# Patient Record
Sex: Female | Born: 1960 | Race: Black or African American | Hispanic: No | Smoking: Never smoker
Health system: Southern US, Community
[De-identification: ages and names within clinical notes are randomized; demographics above are authoritative.]

## PROBLEM LIST (undated history)

## (undated) DIAGNOSIS — I10 Essential (primary) hypertension: Secondary | ICD-10-CM

## (undated) DIAGNOSIS — R739 Hyperglycemia, unspecified: Secondary | ICD-10-CM

## (undated) DIAGNOSIS — F329 Major depressive disorder, single episode, unspecified: Secondary | ICD-10-CM

## (undated) DIAGNOSIS — C8190 Hodgkin lymphoma, unspecified, unspecified site: Secondary | ICD-10-CM

## (undated) DIAGNOSIS — R590 Localized enlarged lymph nodes: Secondary | ICD-10-CM

## (undated) DIAGNOSIS — T7840XA Allergy, unspecified, initial encounter: Secondary | ICD-10-CM

## (undated) DIAGNOSIS — Z862 Personal history of diseases of the blood and blood-forming organs and certain disorders involving the immune mechanism: Secondary | ICD-10-CM

## (undated) DIAGNOSIS — F419 Anxiety disorder, unspecified: Secondary | ICD-10-CM

## (undated) DIAGNOSIS — R7303 Prediabetes: Secondary | ICD-10-CM

## (undated) DIAGNOSIS — Z8669 Personal history of other diseases of the nervous system and sense organs: Secondary | ICD-10-CM

## (undated) DIAGNOSIS — E785 Hyperlipidemia, unspecified: Secondary | ICD-10-CM

## (undated) DIAGNOSIS — A64 Unspecified sexually transmitted disease: Secondary | ICD-10-CM

## (undated) DIAGNOSIS — D219 Benign neoplasm of connective and other soft tissue, unspecified: Secondary | ICD-10-CM

## (undated) DIAGNOSIS — F32A Depression, unspecified: Secondary | ICD-10-CM

## (undated) DIAGNOSIS — E669 Obesity, unspecified: Secondary | ICD-10-CM

## (undated) DIAGNOSIS — M1990 Unspecified osteoarthritis, unspecified site: Secondary | ICD-10-CM

## (undated) HISTORY — DX: Personal history of diseases of the blood and blood-forming organs and certain disorders involving the immune mechanism: Z86.2

## (undated) HISTORY — DX: Allergy, unspecified, initial encounter: T78.40XA

## (undated) HISTORY — DX: Essential (primary) hypertension: I10

## (undated) HISTORY — DX: Major depressive disorder, single episode, unspecified: F32.9

## (undated) HISTORY — DX: Personal history of other diseases of the nervous system and sense organs: Z86.69

## (undated) HISTORY — DX: Hyperglycemia, unspecified: R73.9

## (undated) HISTORY — DX: Localized enlarged lymph nodes: R59.0

## (undated) HISTORY — DX: Hodgkin lymphoma, unspecified, unspecified site: C81.90

## (undated) HISTORY — DX: Unspecified sexually transmitted disease: A64

## (undated) HISTORY — DX: Anxiety disorder, unspecified: F41.9

## (undated) HISTORY — DX: Hyperlipidemia, unspecified: E78.5

## (undated) HISTORY — PX: OTHER SURGICAL HISTORY: SHX169

## (undated) HISTORY — DX: Depression, unspecified: F32.A

## (undated) HISTORY — DX: Obesity, unspecified: E66.9

## (undated) HISTORY — DX: Benign neoplasm of connective and other soft tissue, unspecified: D21.9

## (undated) HISTORY — PX: COLONOSCOPY: SHX174

## (undated) SURGERY — Surgical Case
Anesthesia: *Unknown

---

## 1997-09-19 DIAGNOSIS — C8190 Hodgkin lymphoma, unspecified, unspecified site: Secondary | ICD-10-CM

## 1997-09-19 HISTORY — DX: Hodgkin lymphoma, unspecified, unspecified site: C81.90

## 1998-02-19 ENCOUNTER — Other Ambulatory Visit: Admission: RE | Admit: 1998-02-19 | Discharge: 1998-02-19 | Payer: Self-pay | Admitting: Urology

## 1998-09-19 HISTORY — PX: LAPAROSCOPIC REMOVAL ABDOMINAL MASS: SHX6360

## 1998-09-28 ENCOUNTER — Other Ambulatory Visit: Admission: RE | Admit: 1998-09-28 | Discharge: 1998-09-28 | Payer: Self-pay | Admitting: Obstetrics & Gynecology

## 1999-03-29 ENCOUNTER — Other Ambulatory Visit: Admission: RE | Admit: 1999-03-29 | Discharge: 1999-03-29 | Payer: Self-pay | Admitting: *Deleted

## 1999-05-23 ENCOUNTER — Emergency Department (HOSPITAL_COMMUNITY): Admission: EM | Admit: 1999-05-23 | Discharge: 1999-05-23 | Payer: Self-pay | Admitting: *Deleted

## 1999-05-23 ENCOUNTER — Encounter: Payer: Self-pay | Admitting: Emergency Medicine

## 1999-06-02 ENCOUNTER — Ambulatory Visit (HOSPITAL_COMMUNITY): Admission: RE | Admit: 1999-06-02 | Discharge: 1999-06-02 | Payer: Self-pay | Admitting: Hematology and Oncology

## 1999-06-02 ENCOUNTER — Encounter: Payer: Self-pay | Admitting: Hematology and Oncology

## 1999-06-14 ENCOUNTER — Ambulatory Visit (HOSPITAL_COMMUNITY): Admission: RE | Admit: 1999-06-14 | Discharge: 1999-06-14 | Payer: Self-pay | Admitting: Gastroenterology

## 1999-07-01 ENCOUNTER — Observation Stay (HOSPITAL_COMMUNITY): Admission: RE | Admit: 1999-07-01 | Discharge: 1999-07-02 | Payer: Self-pay | Admitting: General Surgery

## 1999-07-01 ENCOUNTER — Encounter (INDEPENDENT_AMBULATORY_CARE_PROVIDER_SITE_OTHER): Payer: Self-pay | Admitting: Specialist

## 1999-07-01 ENCOUNTER — Encounter: Payer: Self-pay | Admitting: General Surgery

## 1999-07-29 ENCOUNTER — Ambulatory Visit (HOSPITAL_COMMUNITY): Admission: RE | Admit: 1999-07-29 | Discharge: 1999-07-29 | Payer: Self-pay | Admitting: Hematology and Oncology

## 1999-07-29 ENCOUNTER — Encounter: Payer: Self-pay | Admitting: Hematology and Oncology

## 1999-08-02 ENCOUNTER — Ambulatory Visit (HOSPITAL_COMMUNITY): Admission: RE | Admit: 1999-08-02 | Discharge: 1999-08-02 | Payer: Self-pay | Admitting: Hematology and Oncology

## 1999-08-02 ENCOUNTER — Encounter (INDEPENDENT_AMBULATORY_CARE_PROVIDER_SITE_OTHER): Payer: Self-pay

## 1999-08-02 ENCOUNTER — Encounter: Payer: Self-pay | Admitting: Hematology and Oncology

## 1999-08-16 ENCOUNTER — Encounter: Admission: RE | Admit: 1999-08-16 | Discharge: 1999-11-14 | Payer: Self-pay | Admitting: Radiation Oncology

## 2000-12-28 ENCOUNTER — Other Ambulatory Visit: Admission: RE | Admit: 2000-12-28 | Discharge: 2000-12-28 | Payer: Self-pay | Admitting: Obstetrics and Gynecology

## 2001-01-04 ENCOUNTER — Encounter: Payer: Self-pay | Admitting: Obstetrics and Gynecology

## 2001-01-04 ENCOUNTER — Ambulatory Visit (HOSPITAL_COMMUNITY): Admission: RE | Admit: 2001-01-04 | Discharge: 2001-01-04 | Payer: Self-pay | Admitting: Obstetrics and Gynecology

## 2001-01-08 ENCOUNTER — Ambulatory Visit: Admission: RE | Admit: 2001-01-08 | Discharge: 2001-01-08 | Payer: Self-pay | Admitting: *Deleted

## 2001-01-08 ENCOUNTER — Encounter: Payer: Self-pay | Admitting: *Deleted

## 2001-02-13 ENCOUNTER — Other Ambulatory Visit: Admission: RE | Admit: 2001-02-13 | Discharge: 2001-02-13 | Payer: Self-pay | Admitting: Obstetrics and Gynecology

## 2001-03-19 ENCOUNTER — Emergency Department (HOSPITAL_COMMUNITY): Admission: EM | Admit: 2001-03-19 | Discharge: 2001-03-19 | Payer: Self-pay | Admitting: Emergency Medicine

## 2001-03-19 ENCOUNTER — Encounter: Payer: Self-pay | Admitting: Emergency Medicine

## 2001-03-23 ENCOUNTER — Other Ambulatory Visit: Admission: RE | Admit: 2001-03-23 | Discharge: 2001-03-23 | Payer: Self-pay | Admitting: Urology

## 2002-08-08 ENCOUNTER — Inpatient Hospital Stay (HOSPITAL_COMMUNITY): Admission: AD | Admit: 2002-08-08 | Discharge: 2002-08-08 | Payer: Self-pay | Admitting: *Deleted

## 2002-10-05 ENCOUNTER — Emergency Department (HOSPITAL_COMMUNITY): Admission: EM | Admit: 2002-10-05 | Discharge: 2002-10-05 | Payer: Self-pay | Admitting: Emergency Medicine

## 2002-10-05 ENCOUNTER — Encounter: Payer: Self-pay | Admitting: Emergency Medicine

## 2002-10-31 ENCOUNTER — Inpatient Hospital Stay (HOSPITAL_COMMUNITY): Admission: AD | Admit: 2002-10-31 | Discharge: 2002-10-31 | Payer: Self-pay | Admitting: *Deleted

## 2003-03-11 ENCOUNTER — Inpatient Hospital Stay (HOSPITAL_COMMUNITY): Admission: AD | Admit: 2003-03-11 | Discharge: 2003-03-11 | Payer: Self-pay | Admitting: *Deleted

## 2003-10-27 ENCOUNTER — Emergency Department (HOSPITAL_COMMUNITY): Admission: EM | Admit: 2003-10-27 | Discharge: 2003-10-27 | Payer: Self-pay | Admitting: Emergency Medicine

## 2004-08-11 ENCOUNTER — Emergency Department (HOSPITAL_COMMUNITY): Admission: EM | Admit: 2004-08-11 | Discharge: 2004-08-11 | Payer: Self-pay | Admitting: Emergency Medicine

## 2004-09-19 HISTORY — PX: BREAST EXCISIONAL BIOPSY: SUR124

## 2005-01-06 ENCOUNTER — Other Ambulatory Visit: Admission: RE | Admit: 2005-01-06 | Discharge: 2005-01-06 | Payer: Self-pay | Admitting: Obstetrics and Gynecology

## 2005-01-10 ENCOUNTER — Encounter: Admission: RE | Admit: 2005-01-10 | Discharge: 2005-01-10 | Payer: Self-pay | Admitting: Obstetrics and Gynecology

## 2005-09-19 HISTORY — PX: BREAST SURGERY: SHX581

## 2006-05-23 ENCOUNTER — Emergency Department (HOSPITAL_COMMUNITY): Admission: EM | Admit: 2006-05-23 | Discharge: 2006-05-23 | Payer: Self-pay | Admitting: Emergency Medicine

## 2007-01-25 ENCOUNTER — Emergency Department (HOSPITAL_COMMUNITY): Admission: EM | Admit: 2007-01-25 | Discharge: 2007-01-25 | Payer: Self-pay | Admitting: Emergency Medicine

## 2008-03-14 ENCOUNTER — Emergency Department (HOSPITAL_COMMUNITY): Admission: EM | Admit: 2008-03-14 | Discharge: 2008-03-14 | Payer: Self-pay | Admitting: Family Medicine

## 2008-03-24 ENCOUNTER — Other Ambulatory Visit: Admission: RE | Admit: 2008-03-24 | Discharge: 2008-03-24 | Payer: Self-pay | Admitting: Obstetrics and Gynecology

## 2008-03-27 ENCOUNTER — Encounter: Admission: RE | Admit: 2008-03-27 | Discharge: 2008-03-27 | Payer: Self-pay | Admitting: Obstetrics and Gynecology

## 2009-10-24 ENCOUNTER — Emergency Department (HOSPITAL_COMMUNITY): Admission: EM | Admit: 2009-10-24 | Discharge: 2009-10-24 | Payer: Self-pay | Admitting: Emergency Medicine

## 2009-11-02 ENCOUNTER — Emergency Department (HOSPITAL_COMMUNITY): Admission: EM | Admit: 2009-11-02 | Discharge: 2009-11-02 | Payer: Self-pay | Admitting: Emergency Medicine

## 2009-11-02 ENCOUNTER — Emergency Department (HOSPITAL_COMMUNITY): Admission: EM | Admit: 2009-11-02 | Discharge: 2009-11-02 | Payer: Self-pay | Admitting: Family Medicine

## 2010-01-30 ENCOUNTER — Emergency Department (HOSPITAL_COMMUNITY): Admission: EM | Admit: 2010-01-30 | Discharge: 2010-01-30 | Payer: Self-pay | Admitting: Emergency Medicine

## 2010-02-18 ENCOUNTER — Emergency Department (HOSPITAL_COMMUNITY): Admission: EM | Admit: 2010-02-18 | Discharge: 2010-02-18 | Payer: Self-pay | Admitting: Emergency Medicine

## 2010-05-18 ENCOUNTER — Ambulatory Visit (HOSPITAL_COMMUNITY): Payer: Self-pay | Admitting: Psychiatry

## 2010-05-28 ENCOUNTER — Ambulatory Visit (HOSPITAL_COMMUNITY): Payer: Self-pay | Admitting: Psychiatry

## 2010-06-18 ENCOUNTER — Ambulatory Visit (HOSPITAL_COMMUNITY): Payer: Self-pay | Admitting: Psychiatry

## 2010-06-22 ENCOUNTER — Ambulatory Visit (HOSPITAL_COMMUNITY): Payer: Self-pay | Admitting: Psychiatry

## 2010-07-23 ENCOUNTER — Ambulatory Visit (HOSPITAL_COMMUNITY): Payer: Self-pay | Admitting: Psychiatry

## 2010-08-05 ENCOUNTER — Ambulatory Visit (HOSPITAL_COMMUNITY): Payer: Self-pay | Admitting: Psychiatry

## 2010-08-06 ENCOUNTER — Ambulatory Visit (HOSPITAL_COMMUNITY): Payer: Self-pay | Admitting: Psychiatry

## 2010-08-16 ENCOUNTER — Ambulatory Visit (HOSPITAL_COMMUNITY): Payer: Self-pay | Admitting: Psychiatry

## 2010-08-24 ENCOUNTER — Ambulatory Visit (HOSPITAL_COMMUNITY): Payer: Self-pay | Admitting: Physician Assistant

## 2010-08-31 ENCOUNTER — Ambulatory Visit (HOSPITAL_COMMUNITY): Payer: Self-pay | Admitting: Psychiatry

## 2010-10-10 ENCOUNTER — Encounter: Payer: Self-pay | Admitting: Obstetrics and Gynecology

## 2010-10-19 ENCOUNTER — Ambulatory Visit (HOSPITAL_COMMUNITY)
Admission: RE | Admit: 2010-10-19 | Discharge: 2010-10-19 | Payer: Self-pay | Source: Home / Self Care | Attending: Psychiatry | Admitting: Psychiatry

## 2010-11-02 ENCOUNTER — Encounter (HOSPITAL_COMMUNITY): Payer: BC Managed Care – PPO | Admitting: Psychiatry

## 2010-11-02 DIAGNOSIS — F410 Panic disorder [episodic paroxysmal anxiety] without agoraphobia: Secondary | ICD-10-CM

## 2010-11-21 ENCOUNTER — Ambulatory Visit (HOSPITAL_COMMUNITY)
Admission: RE | Admit: 2010-11-21 | Discharge: 2010-11-21 | Disposition: A | Discharge status: Home / Self Care | Payer: BC Managed Care – PPO | Source: Ambulatory Visit | Attending: Psychiatry | Admitting: Psychiatry

## 2010-11-21 DIAGNOSIS — F321 Major depressive disorder, single episode, moderate: Secondary | ICD-10-CM | POA: Insufficient documentation

## 2010-12-07 ENCOUNTER — Encounter (HOSPITAL_COMMUNITY): Payer: BC Managed Care – PPO | Admitting: Physician Assistant

## 2010-12-07 DIAGNOSIS — F411 Generalized anxiety disorder: Secondary | ICD-10-CM

## 2010-12-08 LAB — DIFFERENTIAL
Basophils Absolute: 0 10*3/uL (ref 0.0–0.1)
Lymphocytes Relative: 37 % (ref 12–46)
Neutro Abs: 9.8 10*3/uL — ABNORMAL HIGH (ref 1.7–7.7)
Neutrophils Relative %: 74 % (ref 43–77)
Neutrophils Relative %: 56 % (ref 43–77)

## 2010-12-08 LAB — RAPID URINE DRUG SCREEN, HOSP PERFORMED
Amphetamines: NOT DETECTED
Barbiturates: NOT DETECTED
Benzodiazepines: NOT DETECTED
Cocaine: NOT DETECTED
Opiates: NOT DETECTED
Tetrahydrocannabinol: NOT DETECTED

## 2010-12-08 LAB — BASIC METABOLIC PANEL WITH GFR
BUN: 14 mg/dL (ref 6–23)
CO2: 26 meq/L (ref 19–32)
Calcium: 9 mg/dL (ref 8.4–10.5)
Chloride: 105 meq/L (ref 96–112)
Creatinine, Ser: 0.87 mg/dL (ref 0.4–1.2)
GFR calc non Af Amer: >60 mL/min
Glucose, Bld: 97 mg/dL (ref 70–99)
Potassium: 3.5 meq/L (ref 3.5–5.1)
Sodium: 137 meq/L (ref 135–145)

## 2010-12-08 LAB — POCT CARDIAC MARKERS
CKMB, poc: <1 ng/mL — ABNORMAL LOW (ref 1.0–8.0)
Myoglobin, poc: 49.5 ng/mL (ref 12–200)
Troponin i, poc: <0.05 ng/mL (ref 0.00–0.09)

## 2010-12-08 LAB — CBC
HCT: 37.1 % (ref 36.0–46.0)
HCT: 35.7 % — ABNORMAL LOW (ref 36.0–46.0)
Hemoglobin: 12 g/dL (ref 12.0–15.0)
MCHC: 33.9 g/dL (ref 30.0–36.0)
MCHC: 33.6 g/dL (ref 30.0–36.0)
MCV: 87.5 fL (ref 78.0–100.0)
MCV: 88.5 fL (ref 78.0–100.0)
Platelets: 381 10*3/uL (ref 150–400)
Platelets: 372 K/uL (ref 150–400)
RBC: 4.03 MIL/uL (ref 3.87–5.11)
RDW: 13.9 % (ref 11.5–15.5)
RDW: 13.8 % (ref 11.5–15.5)
WBC: 13.2 10*3/uL — ABNORMAL HIGH (ref 4.0–10.5)
WBC: 7.5 K/uL (ref 4.0–10.5)

## 2010-12-08 LAB — COMPREHENSIVE METABOLIC PANEL
ALT: 17 U/L (ref 0–35)
AST: 20 U/L (ref 0–37)
BUN: 8 mg/dL (ref 6–23)
GFR calc Af Amer: >60 mL/min (ref >60)
Potassium: 3.8 mEq/L (ref 3.5–5.1)
Sodium: 138 mEq/L (ref 135–145)
Total Bilirubin: 0.6 mg/dL (ref 0.3–1.2)
Total Protein: 7.2 g/dL (ref 6.0–8.3)

## 2010-12-14 ENCOUNTER — Ambulatory Visit (HOSPITAL_COMMUNITY): Payer: BC Managed Care – PPO | Admitting: Psychiatry

## 2010-12-14 DIAGNOSIS — F410 Panic disorder [episodic paroxysmal anxiety] without agoraphobia: Secondary | ICD-10-CM

## 2010-12-28 ENCOUNTER — Ambulatory Visit (HOSPITAL_COMMUNITY): Payer: BC Managed Care – PPO | Admitting: Psychiatry

## 2010-12-28 ENCOUNTER — Encounter (HOSPITAL_COMMUNITY): Payer: BC Managed Care – PPO | Admitting: Physician Assistant

## 2011-01-11 ENCOUNTER — Ambulatory Visit (HOSPITAL_COMMUNITY): Payer: BC Managed Care – PPO | Admitting: Psychiatry

## 2011-01-11 DIAGNOSIS — F410 Panic disorder [episodic paroxysmal anxiety] without agoraphobia: Secondary | ICD-10-CM

## 2011-02-02 ENCOUNTER — Encounter (HOSPITAL_COMMUNITY): Payer: BC Managed Care – PPO | Admitting: Physician Assistant

## 2011-02-02 DIAGNOSIS — F411 Generalized anxiety disorder: Secondary | ICD-10-CM

## 2011-02-04 ENCOUNTER — Ambulatory Visit (HOSPITAL_COMMUNITY): Payer: BC Managed Care – PPO | Admitting: Psychiatry

## 2011-04-26 ENCOUNTER — Encounter (HOSPITAL_COMMUNITY): Payer: BC Managed Care – PPO | Admitting: Physician Assistant

## 2011-04-26 DIAGNOSIS — F410 Panic disorder [episodic paroxysmal anxiety] without agoraphobia: Secondary | ICD-10-CM

## 2011-05-08 ENCOUNTER — Ambulatory Visit (INDEPENDENT_AMBULATORY_CARE_PROVIDER_SITE_OTHER): Payer: BC Managed Care – PPO

## 2011-05-08 ENCOUNTER — Inpatient Hospital Stay (INDEPENDENT_AMBULATORY_CARE_PROVIDER_SITE_OTHER)
Admission: RE | Admit: 2011-05-08 | Discharge: 2011-05-08 | Disposition: A | Discharge status: Home / Self Care | Payer: BC Managed Care – PPO | Source: Ambulatory Visit | Attending: Emergency Medicine | Admitting: Emergency Medicine

## 2011-05-08 DIAGNOSIS — S93609A Unspecified sprain of unspecified foot, initial encounter: Secondary | ICD-10-CM

## 2011-05-17 ENCOUNTER — Encounter (HOSPITAL_COMMUNITY): Payer: BC Managed Care – PPO | Admitting: Psychiatry

## 2011-05-31 ENCOUNTER — Encounter (INDEPENDENT_AMBULATORY_CARE_PROVIDER_SITE_OTHER): Payer: BC Managed Care – PPO | Admitting: Physician Assistant

## 2011-05-31 DIAGNOSIS — F410 Panic disorder [episodic paroxysmal anxiety] without agoraphobia: Secondary | ICD-10-CM

## 2011-06-07 ENCOUNTER — Encounter (HOSPITAL_COMMUNITY): Payer: BC Managed Care – PPO | Admitting: Physician Assistant

## 2011-06-16 LAB — POCT URINALYSIS DIP (DEVICE)
Nitrite: NEGATIVE
Operator id: 239701
Protein, ur: NEGATIVE
Specific Gravity, Urine: 1.02
Urobilinogen, UA: 0.2
pH: 5.5

## 2011-06-16 LAB — URINALYSIS, MICROSCOPIC ONLY
Bilirubin Urine: NEGATIVE
Glucose, UA: NEGATIVE
Nitrite: NEGATIVE
Protein, ur: NEGATIVE
Urobilinogen, UA: 0.2
pH: 5.5

## 2011-06-16 LAB — POCT PREGNANCY, URINE
Operator id: 208841
Preg Test, Ur: NEGATIVE

## 2011-06-20 ENCOUNTER — Encounter (HOSPITAL_COMMUNITY): Payer: BC Managed Care – PPO | Admitting: Psychiatry

## 2011-07-05 ENCOUNTER — Encounter (HOSPITAL_COMMUNITY): Payer: BC Managed Care – PPO | Admitting: Psychiatry

## 2011-08-01 ENCOUNTER — Telehealth (HOSPITAL_COMMUNITY): Payer: Self-pay | Admitting: Physician Assistant

## 2011-08-01 NOTE — Telephone Encounter (Signed)
I returned Sashay's call today.  She asked about seeing another provider since her current provider was going out on Maternity leave.  I agreed that this would be a good idea and that she move her appointment with me up to a sooner date than the one she has scheduled.    I have asked that she be given an appointment with M. McGiver if there is room in her schedule.  [facility] will forward this request to [place] and go from there regarding scheduling an appointment for Pernella.

## 2011-08-03 ENCOUNTER — Ambulatory Visit (HOSPITAL_COMMUNITY): Payer: BC Managed Care – PPO | Admitting: Physician Assistant

## 2011-08-04 NOTE — Telephone Encounter (Signed)
Whitney Santiago has agreed to take on [name].  Would you please call her and schedule an appointment with [name] with [name]?  Thanks! Lloyd Huger

## 2011-08-17 ENCOUNTER — Ambulatory Visit (HOSPITAL_COMMUNITY): Payer: BC Managed Care – PPO | Admitting: Physician Assistant

## 2011-08-23 ENCOUNTER — Encounter (HOSPITAL_COMMUNITY): Payer: BC Managed Care – PPO | Admitting: Physician Assistant

## 2012-09-29 ENCOUNTER — Encounter (HOSPITAL_COMMUNITY): Payer: Self-pay | Admitting: Emergency Medicine

## 2012-09-29 ENCOUNTER — Emergency Department (HOSPITAL_COMMUNITY)
Admission: EM | Admit: 2012-09-29 | Discharge: 2012-09-29 | Disposition: A | Discharge status: Home / Self Care | Payer: BC Managed Care – PPO | Source: Home / Self Care

## 2012-09-29 DIAGNOSIS — R209 Unspecified disturbances of skin sensation: Secondary | ICD-10-CM

## 2012-09-29 DIAGNOSIS — IMO0001 Reserved for inherently not codable concepts without codable children: Secondary | ICD-10-CM

## 2012-09-29 MED ORDER — METHYLPREDNISOLONE 4 MG PO KIT: PACK | ORAL | Status: DC

## 2012-09-29 NOTE — ED Provider Notes (Signed)
Medical screening examination/treatment/procedure(s) were performed by non-physician practitioner and as supervising physician I was immediately available for consultation/collaboration.  Floyde Dingley   Riddick Nuon, MD 09/29/12 1524 

## 2012-09-29 NOTE — ED Provider Notes (Signed)
History     CSN: [id number]  Arrival date & time 09/29/12  1158   None     Chief Complaint  Patient presents with  . Numbness    (Consider location/radiation/quality/duration/timing/severity/associated sxs/prior treatment) HPI Comments: 52 year old morbidly obese female presents with numbness in bilateral anterolateral thighs extending from the distal thigh to the hips. This occurs intermittently and is improved after she bends over and then stands up. Tends to get worse during the day with walking. She works as a Physicist, medical person in a Artist. She denies other neurologic symptoms. Denies problems with sphincter control, incontinence pain, motor weakness, ataxia or motor coordination. She also denies back pain or known injury or trauma to the back.   Past Medical History  Diagnosis Date  . Cancer     History reviewed. No pertinent past surgical history.  No family history on file.  History  Substance Use Topics  . Smoking status: Never Smoker   . Smokeless tobacco: Not on file  . Alcohol Use: No    OB History    Grav Para Term Preterm Abortions TAB SAB Ect Mult Living                  Review of Systems  Constitutional: Negative for fever, chills and activity change.  HENT: Negative.   Respiratory: Negative.   Cardiovascular: Negative.   Gastrointestinal: Negative.   Genitourinary: Negative.   Musculoskeletal:       As per HPI  Skin: Negative for color change, pallor and rash.  Neurological: Positive for numbness. Negative for weakness.  Psychiatric/Behavioral: Negative.     Allergies  Hydrocodone and Oxycodone  Home Medications   Current Outpatient Rx  Name  Route  Sig  Dispense  Refill  . METHYLPREDNISOLONE 4 MG PO KIT      follow package directions   21 tablet   0     BP 150/78  Pulse 74  Temp 98.5 F (36.9 C) (Oral)  Resp 20  SpO2 100%  Physical Exam  Constitutional: She is oriented to person, place, and time. She appears  well-developed and well-nourished. No distress.  HENT:  Head: Normocephalic and atraumatic.  Eyes: EOM are normal.  Neck: Normal range of motion. Neck supple.  Pulmonary/Chest: Effort normal.  Musculoskeletal: Normal range of motion. She exhibits no edema and no tenderness.  Lymphadenopathy:    She has no cervical adenopathy.  Neurological: She is alert and oriented to person, place, and time. No cranial nerve deficit.       She currently denies having paresthesias during the visit. Please note local tenderness or focal weakness. Ambulation is coordinated and brisk. Distal motor is 5 over 5.  Skin: Skin is warm and dry.  Psychiatric: She has a normal mood and affect.    ED Course  Procedures (including critical care time)  Labs Reviewed - No data to display No results found.   1. Paresthesias/numbness       MDM  The differential for her paresthesias would include meralgia paresthetica and paresthesias secondary to a lumbar spine condition. This is probably supported by the fact that when she bends over for a few moments and stands back up her symptoms abate. Denies further neurologic signs or symptoms. Denies problems with sphincter control, incontinence lack of sensation or motor weakness. We will treat her with a Medrol Dosepak and strongly recommend that she obtain a PCP as soon as possible for studies which may include nerve conduction studies and EMG.  For any new symptoms problems or worsening may return.         Hayden Rasmussen, NP 09/29/12 [phone]

## 2012-09-29 NOTE — ED Notes (Addendum)
Pt c/o intermittent numbness and tingling of bilateral thighs/legs x2 weeks States that it's usually when she's working/active; she'll rest so the numbness and tingling go away Denies: inj/truama to site, fevers, vomiting, nauseas, diarrhea Recalls this happening after getting over a cold and taking OTC Nyquil and PM Tyle  She is alert w/no signs of acute distress.

## 2013-12-10 ENCOUNTER — Encounter (HOSPITAL_COMMUNITY): Payer: Self-pay | Admitting: Emergency Medicine

## 2013-12-10 ENCOUNTER — Emergency Department (HOSPITAL_COMMUNITY): Payer: BC Managed Care – PPO

## 2013-12-10 ENCOUNTER — Emergency Department (HOSPITAL_COMMUNITY)
Admission: EM | Admit: 2013-12-10 | Discharge: 2013-12-10 | Disposition: A | Discharge status: Nursing Home (Includes ICF) | Payer: BC Managed Care – PPO | Source: Home / Self Care | Attending: Emergency Medicine | Admitting: Emergency Medicine

## 2013-12-10 ENCOUNTER — Emergency Department (HOSPITAL_COMMUNITY)
Admission: EM | Admit: 2013-12-10 | Discharge: 2013-12-10 | Disposition: A | Discharge status: Home / Self Care | Payer: BC Managed Care – PPO | Attending: Emergency Medicine | Admitting: Emergency Medicine

## 2013-12-10 DIAGNOSIS — R1013 Epigastric pain: Secondary | ICD-10-CM

## 2013-12-10 DIAGNOSIS — J3489 Other specified disorders of nose and nasal sinuses: Secondary | ICD-10-CM | POA: Insufficient documentation

## 2013-12-10 DIAGNOSIS — R9431 Abnormal electrocardiogram [ECG] [EKG]: Secondary | ICD-10-CM

## 2013-12-10 DIAGNOSIS — Z79899 Other long term (current) drug therapy: Secondary | ICD-10-CM | POA: Insufficient documentation

## 2013-12-10 DIAGNOSIS — H5789 Other specified disorders of eye and adnexa: Secondary | ICD-10-CM | POA: Insufficient documentation

## 2013-12-10 DIAGNOSIS — Z8571 Personal history of Hodgkin lymphoma: Secondary | ICD-10-CM | POA: Insufficient documentation

## 2013-12-10 DIAGNOSIS — R1011 Right upper quadrant pain: Secondary | ICD-10-CM | POA: Insufficient documentation

## 2013-12-10 LAB — CBC WITH DIFFERENTIAL/PLATELET
Basophils Absolute: 0 10*3/uL (ref 0.0–0.1)
Basophils Relative: 0 % (ref 0–1)
EOS PCT: 0 % (ref 0–5)
Eosinophils Absolute: 0 10*3/uL (ref 0.0–0.7)
HEMATOCRIT: 42 % (ref 36.0–46.0)
Hemoglobin: 13.9 g/dL (ref 12.0–15.0)
LYMPHS ABS: 2.7 10*3/uL (ref 0.7–4.0)
Lymphocytes Relative: 46 % (ref 12–46)
MCH: 29 pg (ref 26.0–34.0)
MCHC: 33.1 g/dL (ref 30.0–36.0)
MCV: 87.5 fL (ref 78.0–100.0)
MONOS PCT: 9 % (ref 3–12)
Monocytes Absolute: 0.5 10*3/uL (ref 0.1–1.0)
NEUTROS ABS: 2.6 10*3/uL (ref 1.7–7.7)
Neutrophils Relative %: 44 % (ref 43–77)
Platelets: 345 10*3/uL (ref 150–400)
RBC: 4.8 MIL/uL (ref 3.87–5.11)
RDW: 13.7 % (ref 11.5–15.5)
WBC: 5.9 10*3/uL (ref 4.0–10.5)

## 2013-12-10 LAB — POCT I-STAT, CHEM 8
BUN: 9 mg/dL (ref 6–23)
CHLORIDE: 99 meq/L (ref 96–112)
CREATININE: 0.9 mg/dL (ref 0.50–1.10)
Calcium, Ion: 1.14 mmol/L (ref 1.12–1.23)
GLUCOSE: 118 mg/dL — AB (ref 70–99)
HCT: 46 % (ref 36.0–46.0)
Hemoglobin: 15.6 g/dL — ABNORMAL HIGH (ref 12.0–15.0)
POTASSIUM: 3.8 meq/L (ref 3.7–5.3)
SODIUM: 138 meq/L (ref 137–147)
TCO2: 26 mmol/L (ref 0–100)

## 2013-12-10 LAB — LIPASE, BLOOD: LIPASE: 29 U/L (ref 11–59)

## 2013-12-10 LAB — POCT H PYLORI SCREEN: H. PYLORI SCREEN, POC: POSITIVE — AB

## 2013-12-10 LAB — TROPONIN I

## 2013-12-10 LAB — HEPATIC FUNCTION PANEL
ALBUMIN: 3.8 g/dL (ref 3.5–5.2)
ALT: 15 U/L (ref 0–35)
AST: 19 U/L (ref 0–37)
Alkaline Phosphatase: 94 U/L (ref 39–117)
TOTAL PROTEIN: 8.1 g/dL (ref 6.0–8.3)
Total Bilirubin: 0.3 mg/dL (ref 0.3–1.2)

## 2013-12-10 MED ORDER — NITROGLYCERIN 0.4 MG SL SUBL
0.4000 mg | SUBLINGUAL_TABLET | SUBLINGUAL | Status: AC | PRN
  Administered 2013-12-10: 0.4 mg via SUBLINGUAL

## 2013-12-10 MED ORDER — GI COCKTAIL ~~LOC~~
30.0000 mL | Freq: Once | ORAL | Status: AC
  Administered 2013-12-10: 30 mL via ORAL

## 2013-12-10 MED ORDER — ASPIRIN 81 MG PO CHEW
CHEWABLE_TABLET | ORAL | Status: AC
  Filled 2013-12-10: qty 4

## 2013-12-10 MED ORDER — SODIUM CHLORIDE 0.9 % IV SOLN
INTRAVENOUS | Status: DC
  Administered 2013-12-10: 11:00:00 via INTRAVENOUS

## 2013-12-10 MED ORDER — ASPIRIN 81 MG PO CHEW
324.0000 mg | CHEWABLE_TABLET | Freq: Once | ORAL | Status: AC
  Administered 2013-12-10: 324 mg via ORAL

## 2013-12-10 MED ORDER — SODIUM CHLORIDE 0.9 % IV SOLN
INTRAVENOUS | Status: DC
  Administered 2013-12-10: 100 mL/h via INTRAVENOUS

## 2013-12-10 MED ORDER — FAMOTIDINE 20 MG PO TABS: 20.0000 mg | ORAL_TABLET | Freq: Two times a day (BID) | ORAL | Status: DC

## 2013-12-10 MED ORDER — ONDANSETRON 4 MG PO TBDP
ORAL_TABLET | ORAL | Status: AC
  Filled 2013-12-10: qty 2

## 2013-12-10 MED ORDER — NITROGLYCERIN 0.4 MG SL SUBL
SUBLINGUAL_TABLET | SUBLINGUAL | Status: AC
  Filled 2013-12-10: qty 1

## 2013-12-10 MED ORDER — ONDANSETRON 4 MG PO TBDP
8.0000 mg | ORAL_TABLET | Freq: Once | ORAL | Status: AC
  Administered 2013-12-10: 8 mg via ORAL

## 2013-12-10 MED ORDER — GI COCKTAIL ~~LOC~~
ORAL | Status: AC
  Filled 2013-12-10: qty 30

## 2013-12-10 NOTE — Discharge Instructions (Signed)
No evidence of any acute cardiac event. No evidence of gallbladder disease. Would recommend trial treatment with Pepcid for peptic ulcer disease. If not improving in a few days would recommend follow back with urgent care for further evaluation. Return for any newer worse symptoms.    Emergency Department Resource Guide 1) Find a Doctor and Pay Out of Pocket Although you won't have to find out who is covered by your insurance plan, it is a good idea to ask around and get recommendations. You will then need to call the office and see if the doctor you have chosen will accept you as a new patient and what types of options they offer for patients who are self-pay. Some doctors offer discounts or will set up payment plans for their patients who do not have insurance, but you will need to ask so you aren't surprised when you get to your appointment.  2) Contact Your Local Health Department Not all health departments have doctors that can see patients for sick visits, but many do, so it is worth a call to see if yours does. If you don't know where your local health department is, you can check in your phone book. The CDC also has a tool to help you locate your state's health department, and many state websites also have listings of all of their local health departments.  3) Find a [facility] If your illness is not likely to be very severe or complicated, you may want to try a walk in clinic. These are popping up all over the country in pharmacies, drugstores, and shopping centers. They're usually staffed by nurse practitioners or physician assistants that have been trained to treat common illnesses and complaints. They're usually fairly quick and inexpensive. However, if you have serious medical issues or chronic medical problems, these are probably not your best option.  No Primary Care Doctor: - Call Health Connect at  [phone] - they can help you locate a primary care doctor that  accepts your  insurance, provides certain services, etc. - Physician Referral Service- [phone]  Chronic Pain Problems: Organization         Address  Phone   Notes  [facility]  [phone] Patients need to be referred by their primary care doctor.   Medication Assistance: Organization         Address  Phone   Notes  [facility] Medication [facility] [place] [place], [place] [address] [phone] --Must be a resident of [facility] -- Must have NO insurance coverage whatsoever (no Medicaid/ Medicare, etc.) -- The pt. MUST have a primary care doctor that directs their care regularly and follows them in the community   [facility]ist  [phone]   [employer]  [phone]    Agencies that provide inexpensive medical care: Organization         Address  Phone   Notes  [place]  [phone]   [facility]    [phone]   [facility] [place], [place] [address] [phone]   [facility] [address], [place] [phone]   Planned Parenthood    [phone]   [facility]    [phone]   [place] and [place] [address],  Phone:  [phone], Fax:  [phone] Hours of Operation:  9 am - 6 pm,  M-F.  Also accepts Medicaid/Medicare and self-pay.  [facility] for [place], [address], [place] Phone: [phone], Fax: [phone]. Hours of Operation:  8:30 am - 5:30 pm, M-F.  Also accepts Medicaid and self-pay.  [facility] [place] [address] [place] Phone: [phone]   [place], [place], [place] [phone], Ext. [phone] Mondays & Thursdays: 7-9 AM.  First 15 patients are seen on a first come, first serve basis.    [place] Providers:  Organization          Address  Phone   Notes  [facility] [address],  [phone] Also accepts self-pay patients.  [facility] [address], [place]  [phone]   [place], [address], [place] [phone]   [facility] [address] [place] [phone]   Lucianne Lei [address], [place]   [phone] Only accepts [place] Access [place] patients after they have their name applied to their card.   Self-Pay (no insurance) in [facility]:  Organization         Address  Phone   Notes  Sickle Cell Patients, [facility] [place] [phone]   [facility] [place] [phone]   [facility] [place]  [place], [place], [place] [phone]   [facility]/Dr. Osei-Bonsu  [address], [place] or [address], [place] [phone] Phone number for both [place] and [place] locations is the same.  Urgent Medical and [facility] [address] [place] [phone]   [facility] [address] [place] or [address] [phone]   [facility] [address], [address] [phone], phone; [phone], fax Sees patients 1st and 3rd Saturday of every month.  Must not qualify for public or private insurance (i.e. Medicaid, Medicare, [place] Health Choice, Veterans' Benefits)  Household income should be no more than 200% of the poverty level The clinic cannot treat you if you are pregnant or think you are pregnant  Sexually transmitted diseases are not treated at the clinic.    Dental Care: Organization         Address  Phone  Notes  [facility] Department of [facility] [place] [phone] Accepts children up to age 58 who are enrolled in [place] or [place]; pregnant women with a Medicaid card; and children who have applied for Medicaid or [place] Health Choice, but were declined, whose parents can pay a reduced fee at time of service.  [facility] Department of [facility]  [address], [place] [phone] Accepts children up to age 76 who are enrolled in [place] or [place]; pregnant women with a Medicaid card; and children who have applied for Medicaid or  Health Choice, but were declined, whose parents can pay a reduced fee at time of service.  [place] Adult Dental Access PROGRAM  [place] [phone] Patients are seen by appointment only. Walk-ins are not accepted. [place] will see patients 22 years of age and older. Monday -  Tuesday (8am-5pm) Most Wednesdays (8:30-5pm) $30 per visit, cash only  [facility] Adult Dental Access PROGRAM  [address], [facility] [phone] Patients are seen by appointment only. Walk-ins are not accepted. [place] will see patients 74 years of age and older. One Wednesday Evening (Monthly: Volunteer Based).  $30 per visit, cash only  [place]  [phone] for adults; Children under age 63, call Graduate Pediatric Dentistry at [phone]. Children aged 64-14, please call [phone] to request a pediatric application.  Dental services are provided in all areas of dental care including fillings, crowns and bridges, complete and partial dentures, implants, gum treatment, root canals, and extractions. Preventive care is also provided. Treatment is provided to both adults and children. Patients are selected via a lottery and there is often a waiting list.   [facility] [address], [place]  [phone] [email or web address]   [facility] [address] [place], [place]  [phone], Ext. [phone] Second and Fourth Thursday of each month, opens at 6:30 AM; Clinic ends at 9 AM.  Patients are seen on a first-come first-served basis, and a limited number are seen during each clinic.   [facility]  [address] Hillard Danker [place], [place] [phone]   Eligibility Requirements You must have lived in [place], [place], or [place] counties for at least the last three months.   You cannot be eligible for state or federal sponsored [employer], including [employer], [place], or [employer].   You generally cannot be eligible for healthcare insurance through your employer.    How to apply: Eligibility screenings are held every Tuesday and Wednesday afternoon from 1:00 pm until 4:00 pm. You do not need an appointment for the interview!  [facility] [address] [place], [place]   [place]  [place] Department  [place]  [phone]    Behavioral Health Resources in the Community: Intensive Outpatient Programs Organization         Address  Phone  Notes  [place]. [address] [place], [place] [phone]   [facility] Outpatient [address] [place], [place]   ADS: Alcohol & Drug Svcs [address] [place], [place]   [place] [address]  [place], [place] or [phone]   Substance Abuse Resources Organization         Address  Phone  Notes  Alcohol and Drug Services  [phone]   [place]  [phone]   [facility] [place]   Chinita Pester  [phone]   Residential & Outpatient Substance Abuse Program  [phone]   Psychological Services Organization         Address  Phone  Notes  [facility] [place]  [place]  [phone]    [place] [address] [place] or [phone]    Mobile Crisis Teams Organization         Address  Phone  Notes  [facility], Mobile Crisis Care Unit  [phone]   [facility]  [address] [place], [place]   [place] [address], [place] [phone]    Self-Help/Support Groups Organization         Address  Phone  Notes  Mental Health Assoc. of [place] - variety of support groups  [place] Call for more information  Narcotics Anonymous (NA), [facility] [address], [employer] [place]  2 meetings at this location   Special educational needs teacher         Address  Phone  Notes  [facility] Residential Treatment [place],    [place]  [phone]   [facility]  [address], [place] [address], [place], [place]   [place], [place] [phone] Admissions: 8am-3pm M-F  Incentives Substance [place] [address],    [place], [place] [phone]   [facility] [address] [place], [place], [place]   [facility] [address]  [place], [place]   [facility] Programs - Intensive Outpatient [address], [place], [place]   [facility] ([place].,  [place], [place] [phone] or [phone]   [facility] Services (RTS) [address] [place], [place] Accepts Medicaid  [facility] [address],  [place] [phone] Substance Abuse/Addiction Treatment   [facility] Organization         Address  Phone  Notes  [facility]  [phone]   Domenic Schwab, PhD [address] Arlis Porta [place], [place]   [phone] or [phone]   [place]  [place], [place] [phone]   [facility] [address], [place], [place] [phone] Insurance/Medicaid/sponsorship through [facility] and Families [address], [address] [place]                                    [place], [place] [phone] [place] [address], [place] [phone]    Dr. Adele Schilder  [phone]   [facility] Dept. 1) [address] [place] 2) [place] 3)  [place] [address], [place] [phone]  [phone]   [place] [phone] or [phone] (After Hours)

## 2013-12-10 NOTE — ED Notes (Signed)
Patient complains of nausea that started yesterday with headache and body aches that started Friday; states fever chills; denies diarrhea.

## 2013-12-10 NOTE — Discharge Instructions (Signed)
We have determined that your problem requires further evaluation in the emergency department.  We will take care of your transport there.  Once at the emergency department, you will be evaluated by a provider and they will order whatever treatment or tests they deem necessary.  We cannot guarantee that they will do any specific test or do any specific treatment.  ° °

## 2013-12-10 NOTE — ED Notes (Signed)
53 yo female presents via [facility] from [facility] with c/o epigastric pain starting yest at 2pm and cold symptoms (fever aches pain itchy watery eyes) on Friday. Today pain 8/10 received 4mg  Zofran, GI cocktail, ASA, and Nitro SL pain now 2/10 intermittently. EKG showed some T wave inversion.  Labs + H. Pylori.  BP 130s HR 60-70 Sat 97% RA Afebrile 98.9.

## 2013-12-10 NOTE — ED Provider Notes (Signed)
Chief Complaint   Chief Complaint  Patient presents with  . Nausea  . Headache    History of Present Illness   Whitney Santiago is a 53 year old female who has had a history since yesterday of epigastric pain without radiation. The pain is worse after meals. His been associated with nausea, anorexia, myalgias, subjective fever, chills, and headache. She feels slightly short of breath and has had a dry cough. She's had difficulty sleeping at night. She denies any chest pain or cardiac history.  Review of Systems   Other than as noted above, the patient denies any of the following symptoms: Constitutional:  No fever, chills, weight loss or anorexia. Abdomen:  No nausea, vomiting, hematememesis, melena, diarrhea, or hematochezia. GU:  No dysuria, frequency, urgency, or hematuria. Gyn:  No vaginal discharge, itching, abnormal bleeding, dyspareunia, or pelvic pain.  [place]   Past medical history, family history, social history, meds, and allergies were reviewed. She is allergic to hydrocodone and oxycodone. She has a history of panic attacks.  Physical Exam     Vital signs:  BP 130/97  Pulse 90  Temp(Src) 98.4 F (36.9 C) (Oral)  Resp 18  SpO2 98% Gen:  Alert, oriented, in no distress. Lungs:  Breath sounds clear and equal bilaterally.  No wheezes, rales or rhonchi. Heart:  Regular rhythm.  No gallops or murmers.   Abdomen:  Soft, flat, nondistended. There is no tenderness, organomegaly, or mass. Bowel sounds are normal. Skin:  Clear, warm and dry.  No rash.  Labs   Results for orders placed during the hospital encounter of 12/10/13  CBC WITH DIFFERENTIAL      Result Value Ref Range   WBC 5.9  4.0 - 10.5 K/uL   RBC 4.80  3.87 - 5.11 MIL/uL   Hemoglobin 13.9  12.0 - 15.0 g/dL   HCT 42.0  36.0 - 46.0 %   MCV 87.5  78.0 - 100.0 fL   MCH 29.0  26.0 - 34.0 pg   MCHC 33.1  30.0 - 36.0 g/dL   RDW 13.7  11.5 - 15.5 %   Platelets 345  150 - 400 K/uL   Neutrophils Relative % 44   43 - 77 %   Neutro Abs 2.6  1.7 - 7.7 K/uL   Lymphocytes Relative 46  12 - 46 %   Lymphs Abs 2.7  0.7 - 4.0 K/uL   Monocytes Relative 9  3 - 12 %   Monocytes Absolute 0.5  0.1 - 1.0 K/uL   Eosinophils Relative 0  0 - 5 %   Eosinophils Absolute 0.0  0.0 - 0.7 K/uL   Basophils Relative 0  0 - 1 %   Basophils Absolute 0.0  0.0 - 0.1 K/uL  POCT I-STAT, CHEM 8      Result Value Ref Range   Sodium 138  137 - 147 mEq/L   Potassium 3.8  3.7 - 5.3 mEq/L   Chloride 99  96 - 112 mEq/L   BUN 9  6 - 23 mg/dL   Creatinine, Ser 0.90  0.50 - 1.10 mg/dL   Glucose, Bld 118 (*) 70 - 99 mg/dL   Calcium, Ion 1.14  1.12 - 1.23 mmol/L   TCO2 26  0 - 100 mmol/L   Hemoglobin 15.6 (*) 12.0 - 15.0 g/dL   HCT 46.0  36.0 - 46.0 %  POCT H PYLORI SCREEN      Result Value Ref Range   H. PYLORI SCREEN, POC POSITIVE (*) NEGATIVE  EKG Results    Date: 12/10/2013  Rate: 93  Rhythm: normal sinus rhythm  QRS Axis: normal  Intervals: QT prolonged  ST/T Wave abnormalities: nonspecific ST/T changes  Conduction Disutrbances:none  Narrative Interpretation: Normal sinus rhythm, biatrial enlargement, left ventricular hypertrophy, nonspecific T wave abnormalities with inverted T waves and downsloping ST segments in leads II, III, and aVF as well as V4 through V6. No old EKG for comparison. Also has prolonged QT.  Old EKG Reviewed: none available  Course in Urgent [place]   She was given Zofran ODT 8 mg sublingually and GI cocktail 30 mL by mouth. When the results of the EKG were available, she was begun on IV normal saline, oxygen at 2 L per minute via nasal cannula, and monitored. She was given aspirin 325 mg by mouth and nitroglycerin 0.4 mg sublingually, and thereafter prepared for transport to the hospital by [place].  Assessment   The primary encounter diagnosis was Epigastric pain. A diagnosis of Abnormal EKG was also pertinent to this visit.  I consider that the epigastric pain may be a manifestation  of inferolateral myocardial ischemia. I think she needs further workup. She also has a positive H. pylori antibody. This may or may not have anything to do with her current symptoms.  Plan     The patient was transferred to the ED via [place] in stable condition.  Medical Decision Making   53 year old female has a 2 day history of epigastric pain, nausea, sweats, and shortness of breath.  No chest pain, but EKG shows T inversions, and downsloping STs in II, III, AVF, and V4-V6, consistent with inferolateral ischemia.  We will start IV NS, O2, monitor, and give ASA and TNG.  Addendum:  Her CBC, and iStat 8 were WNL, but her H. Pylori antibody was faint trace positive.  She was given Zofran and GI cocktail.        Harden Mo, MD 12/10/13 [phone]

## 2013-12-10 NOTE — ED Provider Notes (Addendum)
CSN: [id number]     Arrival date & time 12/10/13  1120 History   First MD Initiated Contact with Patient 12/10/13 1157     Chief Complaint  Patient presents with  . Abdominal Pain     (Consider location/radiation/quality/duration/timing/severity/associated sxs/prior Treatment) The history is provided by the patient.   53 year old female presented to urgent care for epigastric abdominal pain started at 2 PM yesterday afternoon. Earlier in the week symptoms of fever aches itchy watery eyes that has resolved. Workup there at urgent care included an EKG that showed T-wave inversion she was sent here for concern for acute cardiac event. Patient denies any chest pain. Patient was treated with Zofran GI cocktail aspirin and nitroglycerin there. Pain is predominantly epigastric right upper quadrant. Patient still has her gallbladder. No nausea no vomiting. No shortness of breath. No history of any prior cardiac disease. No history of similar pain.  Past Medical History  Diagnosis Date  . Cancer     Non Hodgkins Lymphoma   History reviewed. No pertinent past surgical history. No family history on file. History  Substance Use Topics  . Smoking status: Never Smoker   . Smokeless tobacco: Not on file  . Alcohol Use: No   OB History   Grav Para Term Preterm Abortions TAB SAB Ect Mult Living                 Review of Systems  Constitutional: Negative for fever.  HENT: Positive for congestion.   Eyes: Positive for redness.  Respiratory: Negative for shortness of breath.   Cardiovascular: Negative for chest pain.  Gastrointestinal: Positive for abdominal pain.  Genitourinary: Negative for dysuria.  Musculoskeletal: Negative for myalgias.  Skin: Negative for rash.  Neurological: Negative for headaches.  Hematological: Does not bruise/bleed easily.  Psychiatric/Behavioral: Negative for confusion.      Allergies  Hydrocodone and Oxycodone  Home Medications   Current Outpatient Rx   Name  Route  Sig  Dispense  Refill  . Chlorphen-Pseudoephed-APAP (THERAFLU FLU/COLD PO)   Oral   Take 1 tablet by mouth daily. Alternated with the Alka selter         . Phenyleph-CPM-DM-APAP (ALKA-SELTZER PLUS COLD & FLU PO)   Oral   Take 2 tablets by mouth daily as needed (cold). Alternated with Thera-flu         . famotidine (PEPCID) 20 MG tablet   Oral   Take 1 tablet (20 mg total) by mouth 2 (two) times daily.   30 tablet   0    BP 157/76  Pulse 76  Temp(Src) 98.9 F (37.2 C) (Oral)  Resp 13  Ht 5\' 3"  (1.6 m)  Wt 224 lb (101.606 kg)  BMI 39.69 kg/m2  SpO2 100% Physical Exam  Nursing note and vitals reviewed. Constitutional: She is oriented to person, place, and time. She appears well-developed and well-nourished. No distress.  HENT:  Head: Normocephalic and atraumatic.  Mouth/Throat: Oropharynx is clear and moist.  Eyes: Conjunctivae and EOM are normal. Pupils are equal, round, and reactive to light.  Neck: Normal range of motion.  Cardiovascular: Normal rate, regular rhythm and normal heart sounds.   Pulmonary/Chest: Effort normal and breath sounds normal. No respiratory distress.  Abdominal: Soft. Bowel sounds are normal. She exhibits no distension. There is no tenderness.  Musculoskeletal: Normal range of motion. She exhibits no edema.  Neurological: She is alert and oriented to person, place, and time. No cranial nerve deficit. She exhibits normal muscle tone. Coordination normal.  Skin: Skin is warm. No rash noted.    ED Course  Procedures (including critical care time) Labs Review Labs Reviewed  TROPONIN I  LIPASE, BLOOD  HEPATIC FUNCTION PANEL   Results for orders placed during the hospital encounter of 12/10/13  TROPONIN I      Result Value Ref Range   Troponin I <0.30  <0.30 ng/mL  LIPASE, BLOOD      Result Value Ref Range   Lipase 29  11 - 59 U/L  HEPATIC FUNCTION PANEL      Result Value Ref Range   Total Protein 8.1  6.0 - 8.3 g/dL    Albumin 3.8  3.5 - 5.2 g/dL   AST 19  0 - 37 U/L   ALT 15  0 - 35 U/L   Alkaline Phosphatase 94  39 - 117 U/L   Total Bilirubin 0.3  0.3 - 1.2 mg/dL   Bilirubin, Direct <0.2  0.0 - 0.3 mg/dL   Indirect Bilirubin NOT CALCULATED  0.3 - 0.9 mg/dL   CBC and basic metabolic panel negative at urgent care.   Imaging Review Dg Chest 2 View  12/10/2013   CLINICAL DATA:  Abdominal pain, irregular heart rate  EXAM: CHEST  2 VIEW  COMPARISON:  None.  FINDINGS: Cardiomediastinal silhouette is unremarkable. No acute infiltrate or pleural effusion. No pulmonary edema. Mild degenerative changes thoracic spine.  IMPRESSION: No active cardiopulmonary disease.   Electronically Signed   By: Lahoma Crocker M.D.   On: 12/10/2013 13:09   US Abdomen Complete  12/10/2013   CLINICAL DATA:  Abdominal pain. Personal history of non-Hodgkin's lymphoma.  EXAM: ULTRASOUND ABDOMEN COMPLETE  COMPARISON:  None.  FINDINGS: Gallbladder:  No gallstones or wall thickening visualized. No sonographic Murphy sign noted.  Common bile duct:  Diameter: 3 mm  Liver:  No focal lesion identified. Within normal limits in parenchymal echogenicity.  IVC:  No abnormality visualized.  Pancreas:  Visualized portion unremarkable.  Spleen:  Size and appearance within normal limits.  Right Kidney:  Length: 10.8 cm. Echogenicity within normal limits. No mass or hydronephrosis visualized.  Left Kidney:  Length: 11.1 cm. Echogenicity within normal limits. No mass or hydronephrosis visualized.  Abdominal aorta:  No aneurysm visualized.  Other findings:  None.  IMPRESSION: Negative. No evidence of gallstones or other sonographic abnormality.   Electronically Signed   By: Earle Gell M.D.   On: 12/10/2013 14:01     EKG Interpretation   Date/Time:  Tuesday December 10 2013 12:58:35 EDT Ventricular Rate:  82 PR Interval:  169 QRS Duration: 80 QT Interval:  401 QTC Calculation: 468 R Axis:   76 Text Interpretation:  Sinus rhythm Consider left atrial  enlargement  Consider left ventricular hypertrophy No significant change was found  compared to 11/02/09 Confirmed by Jadelin Eng  MD, [place] [phone]) on 12/10/2013  1:19:33 PM      Date: 12/10/2013  Rate: 93  Rhythm: normal sinus rhythm  QRS Axis: normal  Intervals: normal  ST/T Wave abnormalities: nonspecific ST/T changes  Conduction Disutrbances:none  Narrative Interpretation:   Old EKG Reviewed: none available Also has prolonged QT no EKG currently available for comparison. This EKG was done at 9:15 in the morning at urgent care.  MDM   Final diagnoses:  Epigastric abdominal pain    Patient with workup for epigastric abdominal pain at the urgent care they may get an EKG with some questionable T wave changes and patient was sent here. Patient's troponins negative. Patient's had  symptoms since 2 PM yesterday. Also due to the epigastric and right upper quadrant abdominal pain did a gallbladder workup which was negative ultrasound showed no evidence of gallstones. Liver function tests were fine lipase is not consistent with pancreatitis. Chest x-ray also negative for pneumonia pneumothorax or pulmonary edema. How will treat patient as if she could have peptic ulcer disease. H. pylori screen there was positive we'll start Pepcid send her back to urgent care to see if they want to initiate antibiotics after a few days on the Pepcid.    Mervin Kung, MD 12/10/13 [place], MD 12/10/13 [phone]

## 2014-04-26 ENCOUNTER — Encounter (HOSPITAL_COMMUNITY): Payer: Self-pay | Admitting: Emergency Medicine

## 2014-04-26 ENCOUNTER — Emergency Department (INDEPENDENT_AMBULATORY_CARE_PROVIDER_SITE_OTHER)
Admission: EM | Admit: 2014-04-26 | Discharge: 2014-04-26 | Disposition: A | Discharge status: Home / Self Care | Payer: BC Managed Care – PPO | Source: Home / Self Care | Attending: Family Medicine | Admitting: Family Medicine

## 2014-04-26 DIAGNOSIS — S300XXA Contusion of lower back and pelvis, initial encounter: Secondary | ICD-10-CM

## 2014-04-26 DIAGNOSIS — Y9229 Other specified public building as the place of occurrence of the external cause: Secondary | ICD-10-CM

## 2014-04-26 DIAGNOSIS — W19XXXA Unspecified fall, initial encounter: Secondary | ICD-10-CM

## 2014-04-26 DIAGNOSIS — Y92512 Supermarket, store or market as the place of occurrence of the external cause: Secondary | ICD-10-CM

## 2014-04-26 MED ORDER — TRAMADOL HCL 50 MG PO TABS: 50.0000 mg | ORAL_TABLET | Freq: Four times a day (QID) | ORAL | Status: DC | PRN

## 2014-04-26 NOTE — ED Provider Notes (Signed)
Whitney Santiago is a 53 y.o. female who presents to Urgent Care today for fall. Patient slipped and fell at [employer] today at around noon. She landed on her buttock. She notes pain in her right buttock, right leg, and BL shoulders.  She denies any weakness or numbness or bowel or bladder dysfunction. She is able to walk normally. She has not tried any medicines yet. Her symptoms are moderate. She feels well otherwise.    Past Medical History  Diagnosis Date  . Cancer     Non Hodgkins Lymphoma   History  Substance Use Topics  . Smoking status: Never Smoker   . Smokeless tobacco: Not on file  . Alcohol Use: No   ROS as above Medications: No current facility-administered medications for this encounter.   Current Outpatient Prescriptions  Medication Sig Dispense Refill  . Chlorphen-Pseudoephed-APAP (THERAFLU FLU/COLD PO) Take 1 tablet by mouth daily. Alternated with the Alka selter      . famotidine (PEPCID) 20 MG tablet Take 1 tablet (20 mg total) by mouth 2 (two) times daily.  30 tablet  0  . Phenyleph-CPM-DM-APAP (ALKA-SELTZER PLUS COLD & FLU PO) Take 2 tablets by mouth daily as needed (cold). Alternated with Thera-flu      . traMADol (ULTRAM) 50 MG tablet Take 1 tablet (50 mg total) by mouth every 6 (six) hours as needed.  15 tablet  0    Exam:  BP 183/93  Pulse 79  Temp(Src) 97.7 F (36.5 C) (Oral)  SpO2 98% Gen: Well NAD HEENT: EOMI,  MMM Lungs: Normal work of breathing. CTABL Heart: RRR no MRG Abd: NABS, Soft. Nondistended, Nontender Exts: Brisk capillary refill, warm and well perfused.  Neck: Non-tender normal ROM. Neg Spurling test.  Shoulder BL: Normal appearing and non-tender. Normal ROM and strength.  Back: Non-tender to spinal midline.  TTP right lumbar paraspinal.  Normal lumbar ROM.  LE strength and reflex are intact and = BL Hip: TTP right buttock.  Normal hip ROM.  Mild antalgic gait.  Sensation intact distal BL LE  No results found for this or any  previous visit (from the past 24 hour(s)). No results found.  Assessment and Plan: 53 y.o. female with Contusion right buttock s/p fall.  Plan for tramadol for pain control.  F/u with sports medicine.  Blood pressure is also elevated. F/u with PCP  Discussed warning signs or symptoms. Please see discharge instructions. Patient expresses understanding.   This note was created using Systems analyst. Any transcription errors are unintended.    Gregor Hams, MD 04/26/14 2002

## 2014-04-26 NOTE — ED Notes (Signed)
Pt reports she fell earlier today around 1200 in a food [place] Reports she slipped on wet floor; fell backwards C/o pain on upper back, right hip that radiates down to right foot Ambulated well to exam room w/NAD Alert w/no signs of acute distress.

## 2014-04-26 NOTE — Discharge Instructions (Signed)
Thank you for coming in today. Come back as needed,  Try tramadol for pain.  Come back or go to the emergency room if you notice new weakness new numbness problems walking or bowel or bladder problems. If not better try seeing Dr. Alfonso Ramus.  If worse go to the ER.    Contusion A contusion is the result of an injury to the skin and underlying tissues and is usually caused by direct trauma. The injury results in the appearance of a bruise on the skin overlying the injured tissues. Contusions cause rupture and bleeding of the small capillaries and blood vessels and affect function, because the bleeding infiltrates muscles, tendons, nerves, or other soft tissues.  SYMPTOMS   Swelling and often a hard lump in the injured area, either superficial or deep.  Pain and tenderness over the area of the contusion.  Feeling of firmness when pressure is exerted over the contusion.  Discoloration under the skin, beginning with redness and progressing to the characteristic "black and blue" bruise. CAUSES  A contusion is typically the result of direct trauma. This is often by a blunt object.  RISK INCREASES WITH:  Sports that have a high likelihood of trauma (football, boxing, ice hockey, soccer, field hockey, martial arts, basketball, and baseball).  Sports that make falling from a height likely (high-jumping, pole-vaulting, skating, or gymnastics).  Any bleeding disorder (hemophilia) or taking medications that affect clotting (aspirin, nonsteroidal anti-inflammatory medications, or warfarin [Coumadin]).  Inadequate protection of exposed areas during contact sports. PREVENTION  Maintain physical fitness:  Joint and muscle flexibility.  Strength and endurance.  Coordination.  Wear proper protective equipment. Make sure it fits correctly. PROGNOSIS  Contusions typically heal without any complications. Healing time varies with the severity of injury and intake of medications that affect clotting.  Contusions usually heal in 1 to 4 weeks. RELATED COMPLICATIONS   Damage to nearby nerves or blood vessels, causing numbness, coldness, or paleness.  Compartment syndrome.  Bleeding into the soft tissues that leads to disability.  Infiltrative-type bleeding, leading to the calcification and impaired function of the injured muscle (rare).  Prolonged healing time if usual activities are resumed too soon.  Infection if the skin over the injury site is broken.  Fracture of the bone underlying the contusion.  Stiffness in the joint where the injured muscle crosses. TREATMENT  Treatment initially consists of resting the injured area as well as medication and ice to reduce inflammation. The use of a compression bandage may also be helpful in minimizing inflammation. As pain diminishes and movement is tolerated, the joint where the affected muscle crosses should be moved to prevent stiffness and the shortening (contracture) of the joint. Movement of the joint should begin as soon as possible. It is also important to work on maintaining strength within the affected muscles. Occasionally, extra padding over the area of contusion may be recommended before returning to sports, particularly if re-injury is likely.  MEDICATION   If pain relief is necessary these medications are often recommended:  Nonsteroidal anti-inflammatory medications, such as aspirin and ibuprofen.  Other minor pain relievers, such as acetaminophen, are often recommended.  Prescription pain relievers may be given by your caregiver. Use only as directed and only as much as you need. HEAT AND COLD  Cold treatment (icing) relieves pain and reduces inflammation. Cold treatment should be applied for 10 to 15 minutes every 2 to 3 hours for inflammation and pain and immediately after any activity that aggravates your symptoms. Use ice  packs or an ice massage. (To do an ice massage fill a large styrofoam cup with water and freeze.  Tear a small amount of foam from the top so ice protrudes. Massage ice firmly over the injured area in a circle about the size of a softball.)  Heat treatment may be used prior to performing the stretching and strengthening activities prescribed by your caregiver, physical therapist, or athletic trainer. Use a heat pack or a warm soak. SEEK MEDICAL CARE IF:   Symptoms get worse or do not improve despite treatment in a few days.  You have difficulty moving a joint.  Any extremity becomes extremely painful, numb, pale, or cool (This is an emergency!).  Medication produces any side effects (bleeding, upset stomach, or allergic reaction).  Signs of infection (drainage from skin, headache, muscle aches, dizziness, fever, or general ill feeling) occur if skin was broken. Document Released: 09/05/2005 Document Revised: 11/28/2011 Document Reviewed: 12/18/2008 [facility] Patient Information 2015 [place], [place]. This information is not intended to replace advice given to you by your health care provider. Make sure you discuss any questions you have with your health care provider.

## 2014-04-30 ENCOUNTER — Emergency Department (HOSPITAL_COMMUNITY): Payer: BC Managed Care – PPO

## 2014-04-30 ENCOUNTER — Emergency Department (HOSPITAL_COMMUNITY)
Admission: EM | Admit: 2014-04-30 | Discharge: 2014-05-01 | Disposition: A | Discharge status: Home / Self Care | Payer: BC Managed Care – PPO | Attending: Emergency Medicine | Admitting: Emergency Medicine

## 2014-04-30 ENCOUNTER — Encounter (HOSPITAL_COMMUNITY): Payer: Self-pay | Admitting: Emergency Medicine

## 2014-04-30 DIAGNOSIS — Z87898 Personal history of other specified conditions: Secondary | ICD-10-CM | POA: Insufficient documentation

## 2014-04-30 DIAGNOSIS — S46909A Unspecified injury of unspecified muscle, fascia and tendon at shoulder and upper arm level, unspecified arm, initial encounter: Secondary | ICD-10-CM | POA: Insufficient documentation

## 2014-04-30 DIAGNOSIS — Y9229 Other specified public building as the place of occurrence of the external cause: Secondary | ICD-10-CM | POA: Insufficient documentation

## 2014-04-30 DIAGNOSIS — S79919A Unspecified injury of unspecified hip, initial encounter: Secondary | ICD-10-CM | POA: Insufficient documentation

## 2014-04-30 DIAGNOSIS — W19XXXA Unspecified fall, initial encounter: Secondary | ICD-10-CM

## 2014-04-30 DIAGNOSIS — W010XXA Fall on same level from slipping, tripping and stumbling without subsequent striking against object, initial encounter: Secondary | ICD-10-CM | POA: Diagnosis not present

## 2014-04-30 DIAGNOSIS — S4980XA Other specified injuries of shoulder and upper arm, unspecified arm, initial encounter: Secondary | ICD-10-CM | POA: Diagnosis not present

## 2014-04-30 DIAGNOSIS — S4992XA Unspecified injury of left shoulder and upper arm, initial encounter: Secondary | ICD-10-CM

## 2014-04-30 DIAGNOSIS — S79929A Unspecified injury of unspecified thigh, initial encounter: Secondary | ICD-10-CM

## 2014-04-30 DIAGNOSIS — Y9389 Activity, other specified: Secondary | ICD-10-CM | POA: Insufficient documentation

## 2014-04-30 DIAGNOSIS — Z79899 Other long term (current) drug therapy: Secondary | ICD-10-CM | POA: Diagnosis not present

## 2014-04-30 DIAGNOSIS — S79911A Unspecified injury of right hip, initial encounter: Secondary | ICD-10-CM

## 2014-04-30 NOTE — ED Provider Notes (Signed)
CSN: [id number]     Arrival date & time 04/30/14  2120 History   First MD Initiated Contact with Patient 04/30/14 2209   This chart was scribed for non-physician practitioner Phineas Inches, PA-C, working with Dot Lanes, MD by Rosary Lively, ED scribe. This patient was seen in room TR07C/TR07C and the patient's care was started at 11:50 PM.    Chief Complaint  Patient presents with  . Fall    Patient is a 53 y.o. female presenting with fall. The history is provided by the patient. No language interpreter was used.  Fall This is a new problem. The current episode started more than 2 days ago. Pertinent negatives include no chest pain, no abdominal pain and no shortness of breath. Nothing aggravates the symptoms. Nothing relieves the symptoms. She has tried acetaminophen for the symptoms.   HPI Comments:  Whitney Santiago is a 53 y.o. female who presents to the Emergency Department complaining of a fall that occurred 4 days ago. Pt states that she was in the grocery store, and slipped and fell backwards after stepping in some water. Pt states that when she fell, her legs hit the buggy, and she landed on her right hip. Pt states that she is now experiencing some numbness to the right upper leg. Pt also reports pain in the left shoulder and right ankle. Pt states that she is ambulatory. Pt denies LOC. Pt denies loss of bowel or urine.    Past Medical History  Diagnosis Date  . Cancer     Non Hodgkins Lymphoma   History reviewed. No pertinent past surgical history. No family history on file. History  Substance Use Topics  . Smoking status: Never Smoker   . Smokeless tobacco: Not on file  . Alcohol Use: No   OB History   Grav Para Term Preterm Abortions TAB SAB Ect Mult Living                 Review of Systems  Constitutional: Negative for fever, chills and fatigue.  HENT: Negative for trouble swallowing.   Eyes: Negative for visual disturbance.  Respiratory: Negative for  shortness of breath.   Cardiovascular: Negative for chest pain and palpitations.  Gastrointestinal: Negative for nausea, vomiting, abdominal pain and diarrhea.  Genitourinary: Negative for dysuria and difficulty urinating.  Musculoskeletal: Positive for arthralgias. Negative for neck pain.  Skin: Negative for color change.  Neurological: Negative for dizziness and weakness.  Psychiatric/Behavioral: Negative for dysphoric mood.  All other systems reviewed and are negative.     Allergies  Hydrocodone and Oxycodone  Home Medications   Prior to Admission medications   Medication Sig Start Date End Date Taking? Authorizing Provider  traMADol (ULTRAM) 50 MG tablet Take 1 tablet (50 mg total) by mouth every 6 (six) hours as needed. 04/26/14  Yes Gregor Hams, MD   BP 150/75  Pulse 89  Temp(Src) 98.2 F (36.8 C) (Oral)  Resp 18  Ht 5\' 5"  (1.651 m)  Wt 230 lb (104.327 kg)  BMI 38.27 kg/m2  SpO2 97% Physical Exam  Nursing note and vitals reviewed. Constitutional: She is oriented to person, place, and time. She appears well-developed and well-nourished. No distress.  HENT:  Head: Normocephalic and atraumatic.  Eyes: Conjunctivae and EOM are normal.  Neck: Normal range of motion. Neck supple.  Cardiovascular: Normal rate and regular rhythm.  Exam reveals no gallop and no friction rub.   No murmur heard. Pulmonary/Chest: Effort normal and breath sounds normal. She  has no wheezes. She has no rales. She exhibits no tenderness.  Abdominal: Soft. She exhibits no distension. There is no tenderness. There is no rebound and no guarding.  Musculoskeletal: Normal range of motion.  No midline spine tenderness to palpation. Right buttock tenderness to palpation. Full ROM of right hip. Slightly limited ROM of left shoulder due to pain. No obvious deformity of right hip or left shoulder.   Neurological: She is alert and oriented to person, place, and time.  Extremity strength and sensation equal and  intact bilaterally. Speech is goal-oriented. Moves limbs without ataxia.   Skin: Skin is warm and dry.  Psychiatric: She has a normal mood and affect. Her behavior is normal.    ED Course  Procedures DIAGNOSTIC STUDIES: Oxygen Saturation is 97% on RA, normal by my interpretation.  COORDINATION OF CARE: 11:55 PM-Discussed treatment plan with pt at bedside and pt agreed to plan.  Labs Review Labs Reviewed - No data to display  Imaging Review Dg Hip Complete Right  04/30/2014   CLINICAL DATA:  History of trauma from a fall 4 days ago complaining of right hip pain and numbness.  EXAM: RIGHT HIP - COMPLETE 2+ VIEW  COMPARISON:  No priors.  FINDINGS: There is no evidence of hip fracture or dislocation. There is no evidence of arthropathy or other focal bone abnormality.  IMPRESSION: Negative.   Electronically Signed   By: Vinnie Langton M.D.   On: 04/30/2014 22:15     EKG Interpretation None      MDM   Final diagnoses:  Fall, initial encounter  Shoulder injury, left, initial encounter  Hip injury, right, initial encounter    12:03 AM Patient's xrays unremarkable for acute changes. Vitals stable and patient afebrile. Patient will have Flexeril for muscle spasm. Patient instructed to follow up with Orthopedist if symptoms do not improve. No other injuries.   I personally performed the services described in this documentation, which was scribed in my presence. The recorded information has been reviewed and is accurate.      Alvina Chou, PA-C 05/01/14 0006

## 2014-04-30 NOTE — ED Notes (Signed)
Pt reports she fell Saturday on her R hip. She now has numbness to r upper leg. Also reports pain to L should and R ankle. Pt ambulatory. No loc with fall. Denies loss of bowel or urine.

## 2014-05-01 MED ORDER — CYCLOBENZAPRINE HCL 10 MG PO TABS: 10.0000 mg | ORAL_TABLET | Freq: Two times a day (BID) | ORAL | Status: DC | PRN

## 2014-05-01 NOTE — ED Provider Notes (Signed)
Medical screening examination/treatment/procedure(s) were performed by non-physician practitioner and as supervising physician I was immediately available for consultation/collaboration.     Veryl Speak, MD 05/01/14 [phone]

## 2014-05-01 NOTE — Discharge Instructions (Signed)
Take Flexeril as needed for muscle spasm. Rest your injuries. Follow up with the recommended orthopedic doctor if symptoms do not improve by next week.

## 2014-05-14 ENCOUNTER — Ambulatory Visit: Payer: BC Managed Care – PPO | Attending: Orthopaedic Surgery

## 2014-05-14 DIAGNOSIS — IMO0001 Reserved for inherently not codable concepts without codable children: Secondary | ICD-10-CM | POA: Diagnosis present

## 2014-05-14 DIAGNOSIS — Z48810 Encounter for surgical aftercare following surgery on the sense organs: Secondary | ICD-10-CM | POA: Insufficient documentation

## 2014-05-14 DIAGNOSIS — M6281 Muscle weakness (generalized): Secondary | ICD-10-CM | POA: Diagnosis not present

## 2014-05-14 DIAGNOSIS — M2550 Pain in unspecified joint: Secondary | ICD-10-CM | POA: Insufficient documentation

## 2014-05-19 ENCOUNTER — Ambulatory Visit: Payer: BC Managed Care – PPO | Admitting: Rehabilitation

## 2014-05-19 DIAGNOSIS — Z48810 Encounter for surgical aftercare following surgery on the sense organs: Secondary | ICD-10-CM | POA: Diagnosis not present

## 2014-05-23 ENCOUNTER — Ambulatory Visit: Payer: BC Managed Care – PPO | Attending: Orthopaedic Surgery | Admitting: Physical Therapy

## 2014-05-23 DIAGNOSIS — M25559 Pain in unspecified hip: Secondary | ICD-10-CM | POA: Diagnosis not present

## 2014-05-23 DIAGNOSIS — IMO0001 Reserved for inherently not codable concepts without codable children: Secondary | ICD-10-CM | POA: Diagnosis present

## 2014-05-23 DIAGNOSIS — M67919 Unspecified disorder of synovium and tendon, unspecified shoulder: Secondary | ICD-10-CM | POA: Diagnosis not present

## 2014-05-23 DIAGNOSIS — M6281 Muscle weakness (generalized): Secondary | ICD-10-CM | POA: Diagnosis not present

## 2014-05-23 DIAGNOSIS — M719 Bursopathy, unspecified: Secondary | ICD-10-CM | POA: Insufficient documentation

## 2014-05-27 ENCOUNTER — Ambulatory Visit: Payer: BC Managed Care – PPO | Admitting: Rehabilitation

## 2014-05-29 ENCOUNTER — Ambulatory Visit: Payer: BC Managed Care – PPO | Admitting: Physical Therapy

## 2014-05-29 DIAGNOSIS — IMO0001 Reserved for inherently not codable concepts without codable children: Secondary | ICD-10-CM | POA: Diagnosis not present

## 2014-06-02 ENCOUNTER — Telehealth (HOSPITAL_COMMUNITY): Payer: Self-pay

## 2014-06-03 ENCOUNTER — Encounter: Payer: BC Managed Care – PPO | Admitting: Physical Therapy

## 2014-06-05 ENCOUNTER — Ambulatory Visit: Payer: BC Managed Care – PPO | Admitting: Physical Therapy

## 2014-06-05 DIAGNOSIS — IMO0001 Reserved for inherently not codable concepts without codable children: Secondary | ICD-10-CM | POA: Diagnosis not present

## 2014-06-10 ENCOUNTER — Ambulatory Visit: Payer: BC Managed Care – PPO | Admitting: Physical Therapy

## 2014-06-10 DIAGNOSIS — IMO0001 Reserved for inherently not codable concepts without codable children: Secondary | ICD-10-CM | POA: Diagnosis not present

## 2014-06-16 ENCOUNTER — Ambulatory Visit: Payer: BC Managed Care – PPO | Admitting: Physical Therapy

## 2014-06-16 DIAGNOSIS — IMO0001 Reserved for inherently not codable concepts without codable children: Secondary | ICD-10-CM | POA: Diagnosis not present

## 2014-06-24 ENCOUNTER — Other Ambulatory Visit: Payer: Self-pay | Admitting: Orthopaedic Surgery

## 2014-06-24 ENCOUNTER — Encounter: Payer: BC Managed Care – PPO | Admitting: Physical Therapy

## 2014-06-24 DIAGNOSIS — M25512 Pain in left shoulder: Secondary | ICD-10-CM

## 2014-06-25 ENCOUNTER — Encounter: Payer: BC Managed Care – PPO | Admitting: Rehabilitation

## 2014-06-26 ENCOUNTER — Ambulatory Visit (INDEPENDENT_AMBULATORY_CARE_PROVIDER_SITE_OTHER): Payer: BC Managed Care – PPO | Admitting: Family Medicine

## 2014-06-26 ENCOUNTER — Encounter: Payer: Self-pay | Admitting: Family Medicine

## 2014-06-26 VITALS — BP 132/88 | HR 91 | Temp 98.0°F | Ht 63.75 in | Wt 224.0 lb

## 2014-06-26 DIAGNOSIS — F32A Depression, unspecified: Secondary | ICD-10-CM

## 2014-06-26 DIAGNOSIS — M25512 Pain in left shoulder: Secondary | ICD-10-CM

## 2014-06-26 DIAGNOSIS — IMO0001 Reserved for inherently not codable concepts without codable children: Secondary | ICD-10-CM

## 2014-06-26 DIAGNOSIS — Z1211 Encounter for screening for malignant neoplasm of colon: Secondary | ICD-10-CM

## 2014-06-26 DIAGNOSIS — C8190 Hodgkin lymphoma, unspecified, unspecified site: Secondary | ICD-10-CM | POA: Insufficient documentation

## 2014-06-26 DIAGNOSIS — F329 Major depressive disorder, single episode, unspecified: Secondary | ICD-10-CM

## 2014-06-26 DIAGNOSIS — R030 Elevated blood-pressure reading, without diagnosis of hypertension: Secondary | ICD-10-CM

## 2014-06-26 DIAGNOSIS — E669 Obesity, unspecified: Secondary | ICD-10-CM | POA: Insufficient documentation

## 2014-06-26 DIAGNOSIS — Z23 Encounter for immunization: Secondary | ICD-10-CM

## 2014-06-26 DIAGNOSIS — Z8572 Personal history of non-Hodgkin lymphomas: Secondary | ICD-10-CM

## 2014-06-26 NOTE — Patient Instructions (Signed)
-  please schedule you physical with pap smear as you leave today  -please come fasting to you physical appointment but drink plenty of water  -schedule your mammogram  We recommend the following healthy lifestyle measures: - eat a healthy diet consisting of lots of vegetables, fruits, beans, nuts, seeds, healthy meats such as white chicken and fish and whole grains.  - avoid fried foods, fast food, processed foods, sodas, red meet and other fattening foods.  - get a least 150 minutes of aerobic exercise per week.   -we sent a referral for you colonoscopy -  It usually takes about 1-2 weeks to process and schedule this referral. If you have not heard from [place] regarding this appointment in 2 weeks please contact our office.

## 2014-06-26 NOTE — Progress Notes (Addendum)
No chief complaint on file.   HPI:  Whitney Santiago is here to establish care.  Last PCP and physical: has been at least 8 years since she has had a physical.   Has the following chronic problems and concerns today:  Patient Active Problem List   Diagnosis Date Noted  . Left shoulder pain - managed by [facility] 06/26/2014  . Hx of non-Hodgkin's lymphoma, 2000 - treated at [facility] 06/26/2014  . Obesity 06/26/2014  . Depression 06/26/2014  . Elevated blood pressure 06/26/2014   Elevated BP: -on several visits to the ED on review of chart -she reports: FH of HTN - she reports everyone in the family has HTN -denies:CP, SOB, DOE, palpitations, swelling  Psychiatric Illness/Depression: -reports: went through difficult divorce in 2010  -on review of chart followed by behavioral health for a number of years, notes not available -she reports things have been ok recently -denies: SI, thoughts of self harm or harm to others  Hx of Non-Hodkin's Lymphoma: -reports dx in: 2000 at [place] (Dr. Otelia Limes); reports completed 5 year follow up and they told her she did not need any further follow up -treated with: reports treated with "light chemo", no radiation -denies: breast changes, skin changes, night sweats, melena, hematochezia, fevers, weight loss, SOB, CP  L shoulder pain, R shoulder: -s/p fall in grocery store -seeing [facility] for this  ROS negative for unless reported above: fevers, unintentional weight loss, hearing or vision loss, chest pain, palpitations, struggling to breath, hemoptysis, melena, hematochezia, hematuria, falls, loc, si, thoughts of self harm  Past Medical History  Diagnosis Date  . Cancer     Non Hodgkins Lymphoma  . Non Hodgkin's lymphoma   . History of migraine   . Hypertension   . Allergy   . Depression     Family History  Problem Relation Age of Onset  . Hypertension Mother   . Stroke Mother   . Hypertension Father   . Diabetes Father    . Hypertension Sister   . Hypertension Brother   . Hypertension Daughter     History   Social History  . Marital Status: Divorced    Spouse Name: N/A    Number of Children: N/A  . Years of Education: N/A   Social History Main Topics  . Smoking status: Never Smoker   . Smokeless tobacco: None  . Alcohol Use: No  . Drug Use: No  . Sexual Activity: None   Other Topics Concern  . 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  EXAM:  Filed Vitals:   06/26/14 1601  BP: 132/88  Pulse: 91  Temp: 98 F (36.7 C)    Body mass index is 38.76 kg/(m^2).  GENERAL: vitals reviewed and listed above, alert, oriented, appears well hydrated and in no acute distress  HEENT: atraumatic, conjunttiva clear, no obvious abnormalities on inspection of external nose and ears  NECK: no obvious masses on inspection  LUNGS: clear to auscultation bilaterally, no wheezes, rales or rhonchi, good air movement  CV: HRRR, no peripheral edema  MS: moves all extremities without noticeable abnormality  PSYCH: pleasant and cooperative, no  obvious depression or anxiety  ASSESSMENT AND PLAN:  Discussed the following assessment and plan:  Elevated blood pressure  Depression  Hx of non-Hodgkin's lymphoma, 2000 - treated at [facility]  Left shoulder pain - managed by [facility]  Obesity  Colon cancer screening - Plan: Ambulatory referral to Gastroenterology  -We reviewed the PMH, PSH, FH, SH, Meds and Allergies. -We provided refills for any medications we will prescribe as needed. -We addressed current concerns per orders and patient instructions. -We have asked for records for pertinent exams, studies, vaccines and notes from previous  providers. -We have advised patient to follow up per instructions below. -discussed increased risks for solid rumors, CV disease and other conditions in survivors of NHL and advised a healthy lifestyle, yearly physical, breast exam, skin exam and mammogram -advised to follow with psychiatrist for mental health -discussed tx options for HTN, BP ok today and she thought was stress when in ED, close observation and start medication if high at OVs -flu and tdap today, pt is to schedule CPE with pap  -Patient advised to return or notify a doctor immediately if symptoms worsen or persist or new concerns arise.  Patient Instructions  -please schedule you physical with pap smear as you leave today  -please come fasting to you physical appointment but drink plenty of water  -schedule your mammogram  We recommend the following healthy lifestyle measures: - eat a healthy diet consisting of lots of vegetables, fruits, beans, nuts, seeds, healthy meats such as white chicken and fish and whole grains.  - avoid fried foods, fast food, processed foods, sodas, red meet and other fattening foods.  - get a least 150 minutes of aerobic exercise per week.   -we sent a referral for you colonoscopy -  It usually takes about 1-2 weeks to process and schedule this referral. If you have not heard from [place] regarding this appointment in 2 weeks please contact our office.       Colin Benton R.

## 2014-06-26 NOTE — Progress Notes (Signed)
Pre visit review using our clinic review tool, if applicable. No additional management support is needed unless otherwise documented below in the visit note. 

## 2014-06-26 NOTE — Addendum Note (Signed)
Addended by: Agnes Lawrence on: 06/26/2014 04:51 PM   Modules accepted: Orders

## 2014-07-12 ENCOUNTER — Ambulatory Visit
Admission: RE | Admit: 2014-07-12 | Discharge: 2014-07-12 | Disposition: A | Discharge status: Home / Self Care | Payer: BC Managed Care – PPO | Source: Ambulatory Visit | Attending: Orthopaedic Surgery | Admitting: Orthopaedic Surgery

## 2014-07-12 DIAGNOSIS — M25512 Pain in left shoulder: Secondary | ICD-10-CM

## 2014-07-29 ENCOUNTER — Other Ambulatory Visit: Payer: Self-pay

## 2014-07-29 ENCOUNTER — Encounter: Payer: Self-pay | Admitting: Internal Medicine

## 2014-07-29 DIAGNOSIS — Z1231 Encounter for screening mammogram for malignant neoplasm of breast: Secondary | ICD-10-CM

## 2014-08-19 ENCOUNTER — Ambulatory Visit
Admission: RE | Admit: 2014-08-19 | Discharge: 2014-08-19 | Disposition: A | Discharge status: Home / Self Care | Payer: BC Managed Care – PPO | Source: Ambulatory Visit

## 2014-08-19 DIAGNOSIS — Z1231 Encounter for screening mammogram for malignant neoplasm of breast: Secondary | ICD-10-CM

## 2014-09-01 ENCOUNTER — Telehealth: Payer: Self-pay | Admitting: Family Medicine

## 2014-09-01 DIAGNOSIS — R590 Localized enlarged lymph nodes: Secondary | ICD-10-CM

## 2014-09-01 NOTE — Telephone Encounter (Signed)
Called all numbers in chart. LM to return call. Received copy MRI L shoulder from ortho. L axillary LAD - radiology reports axillary LAD on mammo from 2009. Had assistant call [facility] for records as pt reports followed there for her hy Lymphoma - reports sent to [place] that she was not found in their records. Please get Dr. [name] when she returns call.   [phone] (home) [phone] (work)

## 2014-09-01 NOTE — Telephone Encounter (Signed)
Reports she thinks was from flu shot. Normal mammo recently. Opted to get L breast/axillary [place] just in case. Follow up as scheduled.

## 2014-09-03 ENCOUNTER — Encounter: Payer: BC Managed Care – PPO | Admitting: Family Medicine

## 2014-09-05 ENCOUNTER — Other Ambulatory Visit: Payer: Self-pay | Admitting: Family Medicine

## 2014-09-05 ENCOUNTER — Other Ambulatory Visit (HOSPITAL_COMMUNITY)
Admission: RE | Admit: 2014-09-05 | Discharge: 2014-09-05 | Disposition: A | Discharge status: Home / Self Care | Payer: BC Managed Care – PPO | Source: Ambulatory Visit | Attending: Family Medicine | Admitting: Family Medicine

## 2014-09-05 ENCOUNTER — Ambulatory Visit (INDEPENDENT_AMBULATORY_CARE_PROVIDER_SITE_OTHER): Payer: BC Managed Care – PPO | Admitting: Family Medicine

## 2014-09-05 ENCOUNTER — Encounter: Payer: Self-pay | Admitting: Family Medicine

## 2014-09-05 VITALS — BP 144/100 | HR 85 | Temp 97.6°F | Ht 63.25 in | Wt 222.8 lb

## 2014-09-05 DIAGNOSIS — F329 Major depressive disorder, single episode, unspecified: Secondary | ICD-10-CM

## 2014-09-05 DIAGNOSIS — M25512 Pain in left shoulder: Secondary | ICD-10-CM

## 2014-09-05 DIAGNOSIS — Z124 Encounter for screening for malignant neoplasm of cervix: Secondary | ICD-10-CM

## 2014-09-05 DIAGNOSIS — N760 Acute vaginitis: Secondary | ICD-10-CM | POA: Diagnosis present

## 2014-09-05 DIAGNOSIS — Z113 Encounter for screening for infections with a predominantly sexual mode of transmission: Secondary | ICD-10-CM | POA: Diagnosis present

## 2014-09-05 DIAGNOSIS — E669 Obesity, unspecified: Secondary | ICD-10-CM

## 2014-09-05 DIAGNOSIS — F32A Depression, unspecified: Secondary | ICD-10-CM

## 2014-09-05 DIAGNOSIS — R030 Elevated blood-pressure reading, without diagnosis of hypertension: Secondary | ICD-10-CM

## 2014-09-05 DIAGNOSIS — Z0000 Encounter for general adult medical examination without abnormal findings: Secondary | ICD-10-CM

## 2014-09-05 DIAGNOSIS — Z1151 Encounter for screening for human papillomavirus (HPV): Secondary | ICD-10-CM | POA: Insufficient documentation

## 2014-09-05 DIAGNOSIS — IMO0001 Reserved for inherently not codable concepts without codable children: Secondary | ICD-10-CM

## 2014-09-05 DIAGNOSIS — Z8572 Personal history of non-Hodgkin lymphomas: Secondary | ICD-10-CM

## 2014-09-05 DIAGNOSIS — Z01419 Encounter for gynecological examination (general) (routine) without abnormal findings: Secondary | ICD-10-CM | POA: Diagnosis present

## 2014-09-05 DIAGNOSIS — R590 Localized enlarged lymph nodes: Secondary | ICD-10-CM

## 2014-09-05 LAB — COMPREHENSIVE METABOLIC PANEL
ALT: 13 U/L (ref 0–35)
AST: 17 U/L (ref 0–37)
Albumin: 4 g/dL (ref 3.5–5.2)
Alkaline Phosphatase: 85 U/L (ref 39–117)
BUN: 12 mg/dL (ref 6–23)
CALCIUM: 9 mg/dL (ref 8.4–10.5)
CHLORIDE: 102 meq/L (ref 96–112)
CO2: 27 meq/L (ref 19–32)
Creatinine, Ser: 0.7 mg/dL (ref 0.4–1.2)
GFR: 107.82 mL/min (ref >60.00)
Glucose, Bld: 91 mg/dL (ref 70–99)
Potassium: 3.6 mEq/L (ref 3.5–5.1)
Sodium: 137 mEq/L (ref 135–145)
Total Bilirubin: 0.5 mg/dL (ref 0.2–1.2)
Total Protein: 7.9 g/dL (ref 6.0–8.3)

## 2014-09-05 LAB — HEMOGLOBIN A1C: Hgb A1c MFr Bld: 6.1 % (ref 4.6–6.5)

## 2014-09-05 LAB — CBC WITH DIFFERENTIAL/PLATELET
BASOS ABS: 0 10*3/uL (ref 0.0–0.1)
BASOS PCT: 0.4 % (ref 0.0–3.0)
Eosinophils Absolute: 0 10*3/uL (ref 0.0–0.7)
Eosinophils Relative: 0.5 % (ref 0.0–5.0)
HCT: 38.6 % (ref 36.0–46.0)
HEMOGLOBIN: 12.6 g/dL (ref 12.0–15.0)
LYMPHS PCT: 29.9 % (ref 12.0–46.0)
Lymphs Abs: 2.6 10*3/uL (ref 0.7–4.0)
MCHC: 32.6 g/dL (ref 30.0–36.0)
MCV: 86 fl (ref 78.0–100.0)
MONOS PCT: 5.6 % (ref 3.0–12.0)
Monocytes Absolute: 0.5 10*3/uL (ref 0.1–1.0)
NEUTROS ABS: 5.5 10*3/uL (ref 1.4–7.7)
Neutrophils Relative %: 63.6 % (ref 43.0–77.0)
Platelets: 419 10*3/uL — ABNORMAL HIGH (ref 150.0–400.0)
RBC: 4.49 Mil/uL (ref 3.87–5.11)
RDW: 14.6 % (ref 11.5–15.5)
WBC: 8.7 10*3/uL (ref 4.0–10.5)

## 2014-09-05 LAB — LIPID PANEL
Cholesterol: 214 mg/dL — ABNORMAL HIGH (ref 0–200)
HDL: 47.4 mg/dL (ref >39.00)
LDL Cholesterol: 153 mg/dL — ABNORMAL HIGH (ref 0–99)
NONHDL: 166.6
Total CHOL/HDL Ratio: 5
Triglycerides: 67 mg/dL (ref 0.0–149.0)
VLDL: 13.4 mg/dL (ref 0.0–40.0)

## 2014-09-05 MED ORDER — HYDROCHLOROTHIAZIDE 25 MG PO TABS: 25.0000 mg | ORAL_TABLET | Freq: Every day | ORAL | Status: DC

## 2014-09-05 NOTE — Patient Instructions (Signed)
BEFORE YOU LEAVE: -labs -follow up 1 month  We recommend the following healthy lifestyle measures: - eat a healthy diet consisting of lots of vegetables, fruits, beans, nuts, seeds, healthy meats such as white chicken and fish and whole grains.  - avoid fried foods, fast food, processed foods, sodas, red meet and other fattening foods.  - get a least 150 minutes of aerobic exercise per week.   Vitamin D3 [phone] daily; Calcium 1200mg  - only supplement if inadequate

## 2014-09-05 NOTE — Progress Notes (Signed)
Pre visit review using our clinic review tool, if applicable. No additional management support is needed unless otherwise documented below in the visit note. 

## 2014-09-05 NOTE — Progress Notes (Signed)
HPI:  Here for CPE:  -Concerns and/or follow up today:   Elevated BP: -on several visits to the ED on review of chart -she reports: FH of HTN - she reports everyone in the family has HTN -no hx of antihypertensive use in the past -denies:CP, SOB, DOE, palpitations, swelling  Psychiatric Illness/Depression: -reports: went through difficult divorce in 2010  -on review of chart followed by behavioral health for a number of years, notes not available -she reports things have been ok recently -denies: SI, thoughts of self harm or harm to others  Hx of Non-Hodkin's Lymphoma: -reports dx in: 2000 at [place] (Dr. Otelia Limes); reports completed 5 year follow up and they told her she did not need any further follow up -treated with: reports treated with "light chemo", no radiation -denies: breast changes, skin changes, night sweats, melena, hematochezia, fevers, weight loss, SOB, CP -we called to obtain records and they did not have any - today she remembers her name was Whitney Santiago at the time -MRI of shoulder found incidental lymph node in L axillary region, LAD on remote mammo, advised [place] - she reports has not been contacted about this  L shoulder pain, R shoulder: -s/p fall in grocery store -seeing [facility] for this - had steroid shot - feeling much better  ROS negative for unless reported above: fevers, unintentional weight loss, hearing or vision loss, chest pain, palpitations, struggling to breath, hemoptysis, melena, hematochezia, hematuria, falls, loc, si, thoughts of self harm  -Diet: variety of foods - diet not good  -Exercise: no regular exercise  -Taking folic acid, vitamin D or calcium: no  -Diabetes and Dyslipidemia Screening:  -Hx of HTN: see above  -Vaccines: UTD  -pap history: reports has not had a pap smear in > 5 years, denies hx of abnormal pap smear  -FDLMP: 2000 after chemo  -sexual activity: 1 partner - female only in the last few years  -wants  STI testing: HIV, RPR (she has a hx of treated syphilis), no symptoms, wants Gonorrhea/Chlamydia.trich  -FH breast, colon or ovarian ca: see FH Last mammogram:08/2016 Last colon cancer screening: 2000 at [facility] and reports was normal, she is set up for colonoscopy  Breast Ca Risk Assessment: -no hx breast or ovarian cancer in her or immediate family  -Alcohol, Tobacco, drug use: see social history  Review of Systems - no fevers, unintentional weight loss, vision loss, hearing loss, chest pain, sob, hemoptysis, melena, hematochezia, hematuria, genital discharge, changing or concerning skin lesions, bleeding, bruising, loc, thoughts of self harm or SI  Past Medical History  Diagnosis Date  . Cancer     Non Hodgkins Lymphoma  . Non Hodgkin's lymphoma   . History of migraine   . Hypertension   . Allergy   . Depression     Past Surgical History  Procedure Laterality Date  . Laparascop    . Laparoscopic removal abdominal mass  2000    LLQ  . Breast surgery Left     due to bloody discharge    Family History  Problem Relation Age of Onset  . Hypertension Mother   . Stroke Mother   . Hypertension Father   . Diabetes Father   . Hypertension Sister   . Hypertension Brother   . Hypertension Daughter     History   Social History  . Marital Status: Divorced    Spouse Name: N/A    Number of Children: N/A  . Years of Education: N/A   Social  History Main Topics  . Smoking status: Never Smoker   . Smokeless tobacco: None  . Alcohol Use: No  . Drug Use: No  . Sexual Activity: None   Other Topics Concern  . 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 moist mucus membranes.  NECK: supple, no masses or lymphadenopathy  LUNGS: clear to auscultation bilaterally, no rales, rhonchi or wheeze  CV: HRRR, no peripheral edema or cyanosis, normal pedal pulses  BREAST: normal appearance - no lesions or discharge, on palpation normal breast tissue without any suspicious masses  ABDOMEN: bowel sounds normal, soft, non tender to palpation, no masses, no rebound or guarding  GU: normal appearance of external genitalia - no lesions or masses, normal vaginal mucosa - no abnormal discharge, normal appearance of cervix - no lesions or abnormal discharge, no masses or tenderness on palpation of uterus and ovaries - though exam limited due to body habitus  RECTAL: refused  SKIN: no rash or abnormal lesions  MS: normal gait, moves all extremities normally  NEURO: CN II-XII grossly intact, normal muscle strength and sensation to light touch on extremities  PSYCH: normal affect, pleasant and cooperative  ASSESSMENT AND PLAN:  Discussed the following assessment and plan:  Encounter for preventive health examination - Plan: HIV antibody (with reflex), RPR, Lipid Panel, CBC with Differential, CMP, Hemoglobin A1c  Left shoulder pain - managed by [facility]  Hx of non-Hodgkin's lymphoma, 2000 - treated at [facility] -assistant to obtain records - name was different at time of treatment which she just recalled at this visit -[place] L axillary to check on incidental LAD from MRI from ortho -advised importance of yearly physical and preventive care -no symptoms, no LAD on exam today thought body habitus limits  exam  Obesity  Elevated blood pressure - Plan: hydrochlorothiazide (HYDRODIURIL) 25 MG tablet -discussed tx options,risks, risks of untreated htn at length -opted to start antihypertensive -follow up in 1 month, recheck then  Depression -stable  Cervical cancer screening - Plan: Cytology - PAP  Screening for STD (sexually transmitted disease) - Plan: Cytology - PAP   -asked assistant to obtain records with name change from [place]  -asked referral coordinator to check on status of [place]   -discussed HTN and tx options and decided to do hctz, follow up 1 month  -Discussed and advised all [place] preventive services health task force level A and B recommendations for age, sex and risks.  -pap today  -scheduled for colonoscopy  -Advised at least 150 minutes of exercise per week and a healthy diet low in saturated fats and sweets and consisting of fresh fruits and vegetables, lean meats such as fish and white chicken and whole grains.  -FASTING labs, studies and vaccines  per orders this encounter  Orders Placed This Encounter  Procedures  . HIV antibody (with reflex)  . RPR  . Lipid Panel  . CBC with Differential  . CMP  . Hemoglobin A1c    Patient advised to return to clinic immediately if symptoms worsen or persist or new concerns.  Patient Instructions  BEFORE YOU LEAVE: -labs -follow up 1 month  We recommend the following healthy lifestyle measures: - eat a healthy diet consisting of lots of vegetables, fruits, beans, nuts, seeds, healthy meats such as white chicken and fish and whole grains.  - avoid fried foods, fast food, processed foods, sodas, red meet and other fattening foods.  - get a least 150 minutes of aerobic exercise per week.   Vitamin D3 [phone] IU daily; Calcium 1200mg  - only supplement if inadequate      No Follow-up on file.  Colin Benton R.

## 2014-09-06 LAB — RPR

## 2014-09-06 LAB — HIV ANTIBODY (ROUTINE TESTING W REFLEX): HIV 1&2 Ab, 4th Generation: NONREACTIVE

## 2014-09-08 LAB — CYTOLOGY - PAP

## 2014-09-18 ENCOUNTER — Other Ambulatory Visit: Payer: BC Managed Care – PPO

## 2014-09-22 ENCOUNTER — Telehealth: Payer: Self-pay | Admitting: *Deleted

## 2014-09-22 NOTE — Telephone Encounter (Signed)
Patient no show for previsit appointment 09/22/14. Phone call to patient to reschedule. No answer, number identifier. Message left to call to reschedule previsit appointment by 5 pm today or appointment scheduled for procedure 10/03/14 would be cancelled and both appointments would need to rescheduled.

## 2014-09-22 NOTE — Telephone Encounter (Signed)
Phone call not returned and previsit not rescheduled. No show letter mailed.

## 2014-09-25 ENCOUNTER — Ambulatory Visit
Admission: RE | Admit: 2014-09-25 | Discharge: 2014-09-25 | Disposition: A | Discharge status: Home / Self Care | Payer: BLUE CROSS/BLUE SHIELD | Source: Ambulatory Visit | Attending: Family Medicine | Admitting: Family Medicine

## 2014-09-25 ENCOUNTER — Other Ambulatory Visit: Payer: Self-pay | Admitting: Family Medicine

## 2014-09-25 ENCOUNTER — Ambulatory Visit
Admission: RE | Admit: 2014-09-25 | Discharge: 2014-09-25 | Disposition: A | Discharge status: Home / Self Care | Payer: BLUE CROSS/BLUE SHIELD | Source: Ambulatory Visit

## 2014-09-25 DIAGNOSIS — R590 Localized enlarged lymph nodes: Secondary | ICD-10-CM

## 2014-09-26 ENCOUNTER — Other Ambulatory Visit: Payer: Self-pay | Admitting: *Deleted

## 2014-09-26 ENCOUNTER — Other Ambulatory Visit: Payer: Self-pay | Admitting: Family Medicine

## 2014-09-26 DIAGNOSIS — Z8579 Personal history of other malignant neoplasms of lymphoid, hematopoietic and related tissues: Secondary | ICD-10-CM

## 2014-09-26 DIAGNOSIS — R590 Localized enlarged lymph nodes: Secondary | ICD-10-CM

## 2014-09-26 DIAGNOSIS — Z8572 Personal history of non-Hodgkin lymphomas: Secondary | ICD-10-CM

## 2014-09-29 ENCOUNTER — Telehealth: Payer: Self-pay | Admitting: Oncology

## 2014-09-29 NOTE — Telephone Encounter (Signed)
LEFT MESSAGE FOR PATIENT TO RETURN CALL TO SCHEDULE NP APPT.  °

## 2014-10-03 ENCOUNTER — Encounter: Payer: BC Managed Care – PPO | Admitting: Internal Medicine

## 2014-10-07 ENCOUNTER — Encounter: Payer: Self-pay | Admitting: Family Medicine

## 2014-10-07 ENCOUNTER — Ambulatory Visit (INDEPENDENT_AMBULATORY_CARE_PROVIDER_SITE_OTHER): Payer: BLUE CROSS/BLUE SHIELD | Admitting: Family Medicine

## 2014-10-07 VITALS — BP 120/82 | HR 75 | Temp 97.3°F | Ht 63.25 in | Wt 225.9 lb

## 2014-10-07 DIAGNOSIS — R739 Hyperglycemia, unspecified: Secondary | ICD-10-CM

## 2014-10-07 DIAGNOSIS — E669 Obesity, unspecified: Secondary | ICD-10-CM

## 2014-10-07 DIAGNOSIS — Z8572 Personal history of non-Hodgkin lymphomas: Secondary | ICD-10-CM

## 2014-10-07 DIAGNOSIS — E785 Hyperlipidemia, unspecified: Secondary | ICD-10-CM

## 2014-10-07 DIAGNOSIS — I10 Essential (primary) hypertension: Secondary | ICD-10-CM

## 2014-10-07 NOTE — Patient Instructions (Addendum)
BEFORE YOU LEAVE: -schedule morning appointment in 3 months, come fasting  We recommend the following healthy lifestyle measures: - eat a healthy diet consisting of lots of frozen or fresh vegetables, frozen or fresh fruits, beans, nuts, seeds, healthy meats such as white chicken and fish and whole grains.  - avoid fried foods, fast food, processed foods, sodas, red meet and other fattening foods.  - get a least 150 minutes of aerobic exercise per week.

## 2014-10-07 NOTE — Progress Notes (Signed)
Pre visit review using our clinic review tool, if applicable. No additional management support is needed unless otherwise documented below in the visit note. 

## 2014-10-07 NOTE — Progress Notes (Signed)
HPI:  Elevated BP: -on several visits to the ED on review of chart -meds: started hctz 08/2014 -she reports: doing a "whole lot better"  -FH of HTN  -denies:CP, SOB, DOE, palpitations, swelling  HLD/Prediabetes: -no regular exercise -working on improving diet -prefers not to start medication -denies: polyuria, polydipsia, vision changes  Psychiatric Illness/Depression: -reports: went through difficult divorce in 2010  -on review of chart followed by behavioral health for a number of years, notes not available -she reports things have been ok recently -denies: SI, thoughts of self harm or harm to others  Hx of Non-Hodkin's Lymphoma: -reports dx in: 2000 at [place] (Dr. Otelia Limes); reports completed 5 year follow up and they told her she did not need any further follow up -treated with: reports treated with "light chemo", no radiation -denies: breast changes, skin changes, night sweats, melena, hematochezia, fevers, weight loss, SOB, CP -we called to obtain records and they did not have any - she then remembered her name was Talasia Chrisp at the time -MRI of shoulder found incidental lymph node in L axillary region, LAD on remote mammo, advised [place] - radiologist advised onc eval given hx though favored to be stable and she is scheduling this appointment  L shoulder pain, R shoulder: -s/p fall in grocery store -seeing [facility] for this - had steroid shot - feeling much better   ROS: See pertinent positives and negatives per HPI.  Past Medical History  Diagnosis Date  . Cancer     Non Hodgkins Lymphoma  . Non Hodgkin's lymphoma   . History of migraine   . Hypertension   . Allergy   . Depression     Past Surgical History  Procedure Laterality Date  . Laparascop    . Laparoscopic removal abdominal mass  2000    LLQ  . Breast surgery Left     due to bloody discharge    Family History  Problem Relation Age of Onset  . Hypertension Mother   . Stroke Mother    . Hypertension Father   . Diabetes Father   . Hypertension Sister   . Hypertension Brother   . Hypertension Daughter     History   Social History  . Marital Status: Divorced    Spouse Name: N/A    Number of Children: N/A  . Years of Education: N/A   Social History Main Topics  . Smoking status: Never Smoker   . Smokeless tobacco: None  . Alcohol Use: No  . Drug Use: No  . Sexual Activity: None   Other Topics Concern  . 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  hydrochlorothiazide (HYDRODIURIL) 25 MG tablet, Take 1 tablet (25 mg total) by mouth daily., Disp: 90 tablet, Rfl: 3  EXAM:  Filed Vitals:   10/07/14 1612  BP: 120/82  Pulse: 75  Temp: 97.3 F (36.3 C)    Body mass index is 39.68 kg/(m^2).  GENERAL: vitals reviewed and listed above, alert, oriented, appears well hydrated and in no acute distress  HEENT: atraumatic, conjunttiva clear, no obvious abnormalities on inspection of external nose and ears  NECK: no obvious masses on  inspection  LUNGS: clear to auscultation bilaterally, no wheezes, rales or rhonchi, good air movement  CV: HRRR, no peripheral edema  MS: moves all extremities without noticeable abnormality  PSYCH: pleasant and cooperative, no obvious depression or anxiety  ASSESSMENT AND PLAN:  Discussed the following assessment and plan:  Essential hypertension  Hyperlipemia  Hyperglycemia  Obesity  Hx of non-Hodgkin's lymphoma, 2000 - treated at [place] [facility] -lifestyle recs discussed at length -follow up 3 month -she reports she is in process of scheduling appt with onc -Patient advised to return or notify a doctor immediately if symptoms worsen or persist or new concerns  arise.  Patient Instructions  BEFORE YOU LEAVE: -schedule morning appointment in 3 months, come fasting  We recommend the following healthy lifestyle measures: - eat a healthy diet consisting of lots of frozen or fresh vegetables, frozen or fresh fruits, beans, nuts, seeds, healthy meats such as white chicken and fish and whole grains.  - avoid fried foods, fast food, processed foods, sodas, red meet and other fattening foods.  - get a least 150 minutes of aerobic exercise per week.       Colin Benton R.

## 2014-10-09 ENCOUNTER — Encounter: Payer: Self-pay | Admitting: Family Medicine

## 2014-10-10 ENCOUNTER — Telehealth: Payer: Self-pay | Admitting: Oncology

## 2014-10-10 NOTE — Telephone Encounter (Signed)
S/W PATIENT AND GAVE NP APPT FOR 02/12 @ 1:30 W/DR. SHADAD.  REFERRING DR. Colin Benton DX- LAD, AXILLARY, HX OF LYMPHOMA

## 2014-10-17 ENCOUNTER — Telehealth (HOSPITAL_COMMUNITY): Payer: Self-pay | Admitting: *Deleted

## 2014-10-17 NOTE — Telephone Encounter (Signed)
Medical records call. LMOM for patient to call office back.

## 2014-10-31 ENCOUNTER — Ambulatory Visit: Payer: BLUE CROSS/BLUE SHIELD

## 2014-10-31 ENCOUNTER — Ambulatory Visit (HOSPITAL_BASED_OUTPATIENT_CLINIC_OR_DEPARTMENT_OTHER): Payer: BLUE CROSS/BLUE SHIELD | Admitting: Oncology

## 2014-10-31 ENCOUNTER — Telehealth: Payer: Self-pay | Admitting: Oncology

## 2014-10-31 ENCOUNTER — Other Ambulatory Visit: Payer: BLUE CROSS/BLUE SHIELD

## 2014-10-31 VITALS — BP 155/72 | HR 85 | Temp 97.3°F | Resp 20 | Ht 63.25 in | Wt 227.7 lb

## 2014-10-31 DIAGNOSIS — Z8572 Personal history of non-Hodgkin lymphomas: Secondary | ICD-10-CM

## 2014-10-31 DIAGNOSIS — R911 Solitary pulmonary nodule: Secondary | ICD-10-CM

## 2014-10-31 DIAGNOSIS — I10 Essential (primary) hypertension: Secondary | ICD-10-CM

## 2014-10-31 DIAGNOSIS — C8590 Non-Hodgkin lymphoma, unspecified, unspecified site: Secondary | ICD-10-CM

## 2014-10-31 NOTE — Consult Note (Signed)
Reason for Referral: History of lymphoma.   HPI: This is a pleasant 54 year old  woman native of [place] but have been living in this area for the last 26 years. She has a history of hypertension and mild obesity and diagnosis of lymphoma dating back to 2000. At that time she presented with a left hip/inguinal pain. Her workup revealed lymphadenopathy that was biopsied at that time. 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 She had a bone marrow biopsy which did not show any involvement of the disease. At that time, she was seen at [facility] and was treated with appears to be with rituximab and chlorambucil. Therapy ended around August 2001. She appeared to be in remission at that time and continued to do so. She have discontinued follow-up around 2005 by her recollection. Most recently she developed shoulder pain and imaging study including an MRI of the left shoulder showed enlarged left axillary lymph node measuring about 1.8 cm. She subsequently underwent an ultrasound of that area on 09/26/2014 which showed 3 discrete enlarged axillary lymph nodes. One measures 2 cm x 0.9 x 0.9 cm. And another one measuring 1.4 x 1.4 x 1.2 cm. And a smaller one measuring 1 cm x 0.7 x 1 cm. Relation was recommended. For that reason patient was referred to me for an evaluation.  Clinically, she is completely asymptomatic. She does not report any headaches blurry vision, double vision or syncope. She does not report any fevers, chills, sweats, weight loss, excessive fatigue or tiredness. She has not reported any abdominal distention or early satiety. She had not reported any decline in her performance status or activity levels. She did not report any chest pain, palpitation, orthopnea, edema. She does not report any shortness of breath, dyspnea on exertion, wheezing or cough. She does not report  any nausea, vomiting, hematochezia, melena, constipation or diarrhea. She reported frequency, urgency, hesitancy does not report any skeletal complaints. She does not report any palpable adenopathy or petechiae. Rest of her review of systems unremarkable.   Past Medical History  Diagnosis Date  . Cancer     Non Hodgkins Lymphoma  . Hodgkin disease     lymphocytic predominant Hodkin's 2000, s/p 4 weeks Rituxan, chlorambucil 10/21/99-04/19/00  . History of migraine   . Hypertension   . Allergy   . Depression   . Hx of iron deficiency anemia     per [facility] notes, since childhood, possible thalassemia  :  Past Surgical History  Procedure Laterality Date  . Laparascop    . Laparoscopic removal abdominal mass  2000    LLQ  . Breast surgery Left     due to bloody discharge  :   Current outpatient prescriptions:  .  hydrochlorothiazide (HYDRODIURIL) 25 MG tablet, Take 1 tablet (25 mg total) by mouth daily., Disp: 90 tablet, Rfl: 3:  Allergies  Allergen Reactions  . Hydrocodone Hives  . Oxycodone Hives  :  Family History  Problem Relation Age of Onset  . Hypertension Mother   . Stroke Mother   . Hypertension Father   . Diabetes Father   . Hypertension Sister   . Hypertension Brother   . Hypertension Daughter   :  History   Social History  . Marital Status: Divorced    Spouse Name: N/A  . Number of Children: N/A  . Years of Education: N/A   Occupational History  .  Not on file.   Social History Main Topics  . Smoking status: Never Smoker   . Smokeless tobacco: Not on file  . Alcohol Use: No  . Drug Use: No  . Sexual Activity: Not on file   Other Topics Concern  . 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  Exam: Blood pressure 155/72, pulse 85, temperature 97.3 F (36.3 C), temperature source Oral, resp. rate 20, height 5' 3.25" (1.607 m), weight 227 lb 11.2 oz (103.284 kg), last menstrual period 10/08/2013, SpO2 100 %. General appearance: alert and cooperative Head: Normocephalic, without obvious abnormality Throat: lips, mucosa, and tongue normal; teeth and gums normal Neck: no adenopathy Back: negative Resp: clear to auscultation bilaterally Chest wall: no tenderness Cardio: regular rate and rhythm, S1, S2 normal, no murmur, click, rub or gallop GI: soft, non-tender; bowel sounds normal; no masses,  no organomegaly Extremities: extremities normal, atraumatic, no cyanosis or edema Pulses: 2+ and symmetric Skin: Skin color, texture, turgor normal. No rashes or lesions Lymph nodes: Cervical, supraclavicular, and axillary nodes normal.  CBC    Component Value Date/Time   WBC 8.7 09/05/2014 1224   RBC 4.49 09/05/2014 1224   HGB 12.6 09/05/2014 1224   HCT 38.6 09/05/2014 1224   PLT 419.0* 09/05/2014 1224   MCV 86.0 09/05/2014 1224   MCH 29.0 12/10/2013 0852   MCHC 32.6 09/05/2014 1224   RDW 14.6 09/05/2014 1224   LYMPHSABS 2.6 09/05/2014 1224   MONOABS 0.5 09/05/2014 1224   EOSABS 0.0 09/05/2014 1224   BASOSABS 0.0 09/05/2014 1224      Chemistry      Component Value Date/Time   NA 137 09/05/2014 1224   K 3.6 09/05/2014 1224   CL 102 09/05/2014 1224   CO2 27 09/05/2014 1224   BUN 12 09/05/2014 1224   CREATININE 0.7 09/05/2014 1224      Component Value Date/Time   CALCIUM 9.0 09/05/2014 1224   ALKPHOS 85 09/05/2014 1224   AST 17 09/05/2014 1224   ALT 13 09/05/2014 1224   BILITOT 0.5 09/05/2014 1224     [place] 09/26/2014.  IMPRESSION: Enlarged axillary lymph nodes, larger on the right than the left. There has been no increase in the size of the lymph node since the 2009 mammogram. Nodes on the left appear smaller. This strongly argues against recurrent lymphoma or  metastatic disease. However, there is a 6 year interval between mammograms. Is possible that nodes decreased in size in and increased. Given the patient's history of lymphoma, consultation with the patient's oncologist is recommended. Patient may benefit from a follow-up PET-CT.  MRI left shoulder 07/12/2014.   Enlarged left axillary lymph node. Referring back to the patient's prior mammogram from 03/27/2008, prominent axillary lymph nodes were noted previously. The patient does have a remote history of lymphoma. Although this left axillary adenopathy is presumably chronic, I cannot exclude neoplastic process.   Assessment and Plan:   54 year old woman with the following issues:  1. History of lymphocyte predominant Hodgkin's lymphoma diagnosed in 2000. At that time she presented with inguinal adenopathy although the exact staging at that time is not available  to me. She was treated at [facility] with rituximab concomitantly with chlorambucil orally. She appeared to have a good response and remained disease free. She had not followed up at [facility] for the last 5 years.  Most recently she was found to have an incidental borderline enlarged lymph glands in her left axilla it is unclear whether there are new or residual disease. She is completely asymptomatic at this point without any constitutional symptoms. The natural course of this disease was discussed and the indolent nature of this type of Hodgkin's disease to behave as non-Hodgkin's lymphoma.  From a management standpoint, I would obtain a PET CT scan for staging workup and if she has diffuse lymphadenopathy that is PET avid, I would consider biopsying any amendable lymphadenopathy. Given the fact that has been close to 16 years since her diagnosis he would be reasonable to biopsy any enlarging lymphadenopathy.  If her PET scan showed no major activity is reasonable to assume that she had a  residual borderline lymphadenopathy from her previous treatment and no further therapy could be warranted. I'll like to obtain old imaging studies as well as her old records from [facility] to compare and make sure of the chronicity of her lymphadenopathy.  PET scan will be scheduled in the near future and depending on these results a biopsy could be scheduled versus observation alone.  2. Hypertension: Followed by primary care physician regarding this issue.  3. Age-appropriate cancer screening: She is up to date at this point for her last mammogram on 08/19/2014 did not show any abnormalities to suggest other malignancy involving these lymphadenopathy.  4. Follow-up: Will be determined by the results of the PET scan and a potential biopsy.

## 2014-10-31 NOTE — Telephone Encounter (Signed)
Left message to confirm appointment for March. Mailed avs & calendar.

## 2014-10-31 NOTE — Progress Notes (Signed)
Please see consult note.  

## 2014-10-31 NOTE — Addendum Note (Signed)
Addended by: Randolm Idol on: 10/31/2014 03:19 PM   Modules accepted: Medications

## 2014-11-04 ENCOUNTER — Ambulatory Visit (AMBULATORY_SURGERY_CENTER): Payer: Self-pay

## 2014-11-04 VITALS — Ht 63.0 in | Wt 226.4 lb

## 2014-11-04 DIAGNOSIS — Z1211 Encounter for screening for malignant neoplasm of colon: Secondary | ICD-10-CM

## 2014-11-04 NOTE — Progress Notes (Signed)
Per pt, no allergies to soy or egg products.Pt not taking any weight loss meds or using  O2 at home.   Pt states she had a colonoscopy done in 1999 at [facility], which was normal

## 2014-11-12 ENCOUNTER — Encounter: Payer: Self-pay | Admitting: Internal Medicine

## 2014-11-21 ENCOUNTER — Ambulatory Visit (HOSPITAL_COMMUNITY)
Admission: RE | Admit: 2014-11-21 | Discharge: 2014-11-21 | Disposition: A | Discharge status: Home / Self Care | Payer: BLUE CROSS/BLUE SHIELD | Source: Ambulatory Visit | Attending: Oncology | Admitting: Oncology

## 2014-11-21 DIAGNOSIS — R590 Localized enlarged lymph nodes: Secondary | ICD-10-CM | POA: Diagnosis not present

## 2014-11-21 DIAGNOSIS — C8590 Non-Hodgkin lymphoma, unspecified, unspecified site: Secondary | ICD-10-CM

## 2014-11-21 DIAGNOSIS — C8190 Hodgkin lymphoma, unspecified, unspecified site: Secondary | ICD-10-CM | POA: Insufficient documentation

## 2014-11-21 DIAGNOSIS — N852 Hypertrophy of uterus: Secondary | ICD-10-CM | POA: Diagnosis not present

## 2014-11-21 LAB — GLUCOSE, CAPILLARY: GLUCOSE-CAPILLARY: 93 mg/dL (ref 70–99)

## 2014-11-21 MED ORDER — FLUDEOXYGLUCOSE F - 18 (FDG) INJECTION
11.3000 | Freq: Once | INTRAVENOUS | Status: AC | PRN
  Administered 2014-11-21: 11.3 via INTRAVENOUS

## 2014-11-28 ENCOUNTER — Ambulatory Visit (AMBULATORY_SURGERY_CENTER): Payer: BLUE CROSS/BLUE SHIELD | Admitting: Internal Medicine

## 2014-11-28 ENCOUNTER — Encounter: Payer: Self-pay | Admitting: Internal Medicine

## 2014-11-28 VITALS — BP 166/92 | HR 66 | Temp 97.4°F | Resp 15 | Ht 63.0 in | Wt 226.0 lb

## 2014-11-28 DIAGNOSIS — D122 Benign neoplasm of ascending colon: Secondary | ICD-10-CM

## 2014-11-28 DIAGNOSIS — D128 Benign neoplasm of rectum: Secondary | ICD-10-CM

## 2014-11-28 DIAGNOSIS — K621 Rectal polyp: Secondary | ICD-10-CM | POA: Diagnosis not present

## 2014-11-28 DIAGNOSIS — Z1211 Encounter for screening for malignant neoplasm of colon: Secondary | ICD-10-CM

## 2014-11-28 DIAGNOSIS — K635 Polyp of colon: Secondary | ICD-10-CM

## 2014-11-28 MED ORDER — SODIUM CHLORIDE 0.9 % IV SOLN: 500.0000 mL | INTRAVENOUS | Status: DC

## 2014-11-28 NOTE — Progress Notes (Signed)
Report to PACU, RN, vss, BBS= Clear.  

## 2014-11-28 NOTE — Op Note (Signed)
[place]  [address] [place], [id number]   COLONOSCOPY PROCEDURE REPORT  PATIENT: Whitney, Santiago  MR#: [id number] BIRTHDATE: 1961-07-16 , 53  yrs. old GENDER: female ENDOSCOPIST: Gatha Mayer, MD, [facility] PROCEDURE DATE:  11/28/2014 PROCEDURE:   Colonoscopy, screening, Colonoscopy with biopsy, and Colonoscopy with snare polypectomy First Screening Colonoscopy - Avg.  risk and is 50 yrs.  old or older Yes.  Prior Negative Screening - Now for repeat screening. N/A  History of Adenoma - Now for follow-up colonoscopy & has been > or = to 3 yrs.  N/A ASA CLASS:   Class III INDICATIONS:Screening for colonic neoplasia and Colorectal Neoplasm Risk Assessment for this procedure is average risk. MEDICATIONS: Propofol 200 mg IV, Lidocaine 200 mg IV, and Monitored anesthesia care  DESCRIPTION OF PROCEDURE:   After the risks benefits and alternatives of the procedure were thoroughly explained, informed consent was obtained.  The digital rectal exam revealed no abnormalities of the rectum.   The [id number]  endoscope was introduced through the anus and advanced to the cecum, which was identified by both the appendix and ileocecal valve. No adverse events experienced.   The quality of the prep was excellent. (MiraLax was used)  The instrument was then slowly withdrawn as the colon was fully examined.      COLON FINDINGS: Two sessile polyps ranging from 2 to 57mm in size were found in the rectum (10 mm) and ascending colon (62mm) Polypectomies were performed using snare cautery (rectum) and with cold forceps (ascending colon).  The resection was complete, the polyp tissue was completely retrieved and sent to histology.   The examination was otherwise normal.  Retroflexed views revealed no abnormalities. The time to cecum = 1.9 Withdrawal time = 11.5   The scope was withdrawn and the procedure completed. COMPLICATIONS: There were no immediate  complications.  ENDOSCOPIC IMPRESSION: 1.   Two sessile polyps ranging from 2 to 12mm in size were found in the rectum (34mm)  and ascending colon (44mm); polypectomies were performed using snare cautery and with cold forceps 2.   The examination was otherwise normal  RECOMMENDATIONS: 1.  Hold Aspirin and all other NSAIDS for 2 weeks. 2.  Timing of repeat colonoscopy will be determined by pathology findings.  eSigned:  Gatha Mayer, MD, [facility] 11/28/2014 11:21 AM   cc: Dr. Colin Benton and The Patient

## 2014-11-28 NOTE — Patient Instructions (Addendum)
I found and removed 2 polyps today. I will let you know pathology results and when to have another routine colonoscopy by mail.  I appreciate the opportunity to care for you. Gatha Mayer, MD, FACG  YOU HAD AN ENDOSCOPIC PROCEDURE TODAY AT [facility]:   Refer to the procedure report that was given to you for any specific questions about what was found during the examination.  If the procedure report does not answer your questions, please call your gastroenterologist to clarify.  If you requested that your care partner not be given the details of your procedure findings, then the procedure report has been included in a sealed envelope for you to review at your convenience later.  YOU SHOULD EXPECT: Some feelings of bloating in the abdomen. Passage of more gas than usual.  Walking can help get rid of the air that was put into your GI tract during the procedure and reduce the bloating. If you had a lower endoscopy (such as a colonoscopy or flexible sigmoidoscopy) you may notice spotting of blood in your stool or on the toilet paper. If you underwent a bowel prep for your procedure, you may not have a normal bowel movement for a few days.  Please Note:  You might notice some irritation and congestion in your nose or some drainage.  This is from the oxygen used during your procedure.  There is no need for concern and it should clear up in a day or so.  SYMPTOMS TO REPORT IMMEDIATELY:   Following lower endoscopy (colonoscopy or flexible sigmoidoscopy):  Excessive amounts of blood in the stool  Significant tenderness or worsening of abdominal pains  Swelling of the abdomen that is new, acute  Fever of 100F or higher   For urgent or emergent issues, a gastroenterologist can be reached at any hour by calling [phone].   DIET: Your first meal following the procedure should be a small meal and then it is ok to progress to your normal diet. Heavy or fried foods are  harder to digest and may make you feel nauseous or bloated.  Likewise, meals heavy in dairy and vegetables can increase bloating.  Drink plenty of fluids but you should avoid alcoholic beverages for 24 hours.  ACTIVITY:  You should plan to take it easy for the rest of today and you should NOT DRIVE or use heavy machinery until tomorrow (because of the sedation medicines used during the test).    FOLLOW UP: Our staff will call the number listed on your records the next business day following your procedure to check on you and address any questions or concerns that you may have regarding the information given to you following your procedure. If we do not reach you, we will leave a message.  However, if you are feeling well and you are not experiencing any problems, there is no need to return our call.  We will assume that you have returned to your regular daily activities without incident.  If any biopsies were taken you will be contacted by phone or by letter within the next 1-3 weeks.  Please call us at [phone] if you have not heard about the biopsies in 3 weeks.    SIGNATURES/CONFIDENTIALITY: You and/or your care partner have signed paperwork which will be entered into your electronic medical record.  These signatures attest to the fact that that the information above on your After Visit Summary has been reviewed and is understood.  Full  responsibility of the confidentiality of this discharge information lies with you and/or your care-partner.  Polyp information given. Hold all aspirin and NSAIDS for 2 weeks.

## 2014-11-28 NOTE — Progress Notes (Signed)
Called to room to assist during endoscopic procedure.  Patient ID and intended procedure confirmed with present staff. Received instructions for my participation in the procedure from the performing physician.  

## 2014-12-01 ENCOUNTER — Telehealth: Payer: Self-pay

## 2014-12-01 NOTE — Telephone Encounter (Signed)
No answer, left voicemail

## 2014-12-04 ENCOUNTER — Encounter: Payer: Self-pay | Admitting: Internal Medicine

## 2014-12-04 NOTE — Progress Notes (Signed)
Quick Note:  Lymphoid and prolapse polyps - repeat 2026 ______

## 2014-12-11 ENCOUNTER — Ambulatory Visit (HOSPITAL_BASED_OUTPATIENT_CLINIC_OR_DEPARTMENT_OTHER): Payer: BLUE CROSS/BLUE SHIELD | Admitting: Oncology

## 2014-12-11 ENCOUNTER — Telehealth: Payer: Self-pay | Admitting: Oncology

## 2014-12-11 VITALS — BP 132/75 | HR 89 | Temp 97.9°F | Resp 18 | Ht 63.0 in | Wt 225.2 lb

## 2014-12-11 DIAGNOSIS — C8190 Hodgkin lymphoma, unspecified, unspecified site: Secondary | ICD-10-CM | POA: Diagnosis not present

## 2014-12-11 NOTE — Progress Notes (Signed)
Hematology and Oncology Follow Up Visit  Whitney Santiago [id number] 16-May-1961 54 y.o. 12/11/2014 4:01 PM Whitney Santiago., DOKim, Nickola Major, DO   Principle Diagnosis: 54 year old woman diagnosed with lymphocyte predominant Hodgkin's lymphoma in 2000. She presented with inguinal adenopathy. She has been in remission since year 2001 but most recently found to have incidental axillary lymphadenopathy.   Prior Therapy: She was treated at [facility] with rituximab concomitantly with chlorambucil orally. She appeared to have a good response and remained disease free. She had not followed up at [facility] for the last 5 years.  Current therapy: Has been under observation and surveillance but currently under evaluation axillary adenopathy detected on her shoulder MRI in October 2015.  Interim History: Whitney Santiago presents today for a follow-up visit. Since the last visit, she underwent a PET CT scan for staging purposes as well as a routine screening colonoscopy. Clinically have not reported any new symptoms. She has not reported any palpable adenopathy or constitutional symptoms. He continues to be active and performs activities of daily living.  She does not report any headaches blurry vision, double vision or syncope. She does not report any fevers, chills, sweats, weight loss, excessive fatigue or tiredness. She has not reported any abdominal distention or early satiety. She had not reported any decline in her performance status or activity levels. She did not report any chest pain, palpitation, orthopnea, edema. She does not report any shortness of breath, dyspnea on exertion, wheezing or cough. She does not report any nausea, vomiting, hematochezia, melena, constipation or diarrhea. She reported frequency, urgency, hesitancy does not report any skeletal complaints. She does not report any palpable adenopathy or petechiae. Rest of her review of systems unremarkable.    Medications: I have reviewed the patient's current medications.  Current Outpatient Prescriptions  Medication Sig Dispense Refill  . Calcium Carb-Cholecalciferol (CALCIUM + D3 PO) Take by mouth daily.    . Cholecalciferol (VITAMIN D3) 2000 UNITS TABS Take by mouth daily.    . hydrochlorothiazide (HYDRODIURIL) 25 MG tablet Take 1 tablet (25 mg total) by mouth daily. 90 tablet 3   No current facility-administered medications for this visit.     Allergies:  Allergies  Allergen Reactions  . Hydrocodone Hives  . Oxycodone Hives    Past Medical History, Surgical history, Social history, and Family History were reviewed and updated.   Physical Exam: Blood pressure 132/75, pulse 89, temperature 97.9 F (36.6 C), temperature source Oral, resp. rate 18, height 5\' 3"  (1.6 m), weight 225 lb 3.2 oz (102.15 kg), last menstrual period 10/08/2013, SpO2 100 %. ECOG: 0 General appearance: alert and cooperative Head: Normocephalic, without obvious abnormality Neck: no adenopathy Lymph nodes: Cervical, supraclavicular, and axillary nodes normal. Heart: Regular rate and rhythm. Lung: No rhonchi, wheezes, or dullness to percussion. Clear throughout the lung fields. Abdomin: soft, non-tender, without masses or organomegaly EXT:no erythema, induration, or nodules   Lab Results: Lab Results  Component Value Date   WBC 8.7 09/05/2014   HGB 12.6 09/05/2014   HCT 38.6 09/05/2014   MCV 86.0 09/05/2014   PLT 419.0* 09/05/2014     Chemistry      Component Value Date/Time   NA 137 09/05/2014 1224   K 3.6 09/05/2014 1224   CL 102 09/05/2014 1224   CO2 27 09/05/2014 1224   BUN 12 09/05/2014 1224   CREATININE 0.7 09/05/2014 1224      Component Value Date/Time   CALCIUM 9.0 09/05/2014 1224  ALKPHOS 85 09/05/2014 1224   AST 17 09/05/2014 1224   ALT 13 09/05/2014 1224   BILITOT 0.5 09/05/2014 1224       Radiological Studies: CLINICAL DATA: Subsequent treatment strategy for Hodgkin's  lymphoma diagnosed in 2000 with subsequent remission. Recent left axillary lymphadenopathy on shoulder MRI. Initial encounter.  EXAM: NUCLEAR MEDICINE PET SKULL BASE TO THIGH  TECHNIQUE: 11.3 mCi F-18 FDG was injected intravenously. Full-ring PET imaging was performed from the skull base to thigh after the radiotracer. CT data was obtained and used for attenuation correction and anatomic localization.  FASTING BLOOD GLUCOSE: Value: 93 mg/dl  COMPARISON: Left shoulder MRI 07/12/2014.  FINDINGS: NECK  No hypermetabolic cervical lymph nodes are identified.There is nonspecific hypermetabolic activity within [facility] ring without demonstrated asymmetry, probably physiologic.  CHEST  Hypermetabolic axillary lymph nodes are present bilaterally. These are larger and more numerous on the right, measuring 1.8 x 2.6 cm on image 54 and 2.0 x 2.5 cm on image 57. Right axillary nodes demonstrate an SUV max of up to 14.0. On the left, there is a 1.4 x 2.1 cm node on image 61 with an SUV max of 8.4. There is no hypermetabolic mediastinal or hilar adenopathy. No abnormal pulmonary activity or suspicious pulmonary nodule.  ABDOMEN/PELVIS  There is no hypermetabolic activity within the liver, adrenal glands, spleen or pancreas. There is no hypermetabolic nodal activity. The spleen is normal in size. Globular enlargement of the uterus is noted, likely representing fibroids or adenomyosis. No associated abnormal metabolic activity.  SKELETON  There is no hypermetabolic activity to suggest osseous metastatic disease.  IMPRESSION: 1. Bilateral axillary hypermetabolic adenopathy, more extensive on the right. Appearance is worrisome for recurrent lymphoma. Tissue sampling recommended. 2. No other hypermetabolic nodal activity within the neck, chest, abdomen or pelvis. Nonspecific activity within the lymphoid tissue of Waldeyer's ring. 3. Uterine fibroids or  adenomyosis.   Impression and Plan:   54 year old woman with the following issues:  1. History of lymphocyte predominant Hodgkin's lymphoma diagnosed in 2000. At that time she presented with inguinal adenopathy although the exact staging at that time is not available to me. She was treated at [facility] with rituximab concomitantly with chlorambucil orally. She appeared to have a good response and remained disease free. She had not followed up at [facility] for the last 5 years.  Her most recent PET CT scan was discussed today and showed bilateral axillary lymphadenopathy the right more than the left. Although her diseases barely palpable but certainly worrisome for recurrence of her lymphoma or development of a different form of malignancy. Options of treatment were discussed today and my preference is to obtain a biopsy for sampling purposes at this time. I will refer her to Gen. surgery for an excisional biopsy of her axillary lymph nodes for tissue diagnosis. Once that diagnosis and been established, the course of action will be determined.  2. Age-appropriate cancer screening: She is status post colonoscopy and update on mammography.  [facility], MD 3/24/20164:01 PM

## 2014-12-11 NOTE — Telephone Encounter (Signed)
gave and printed appt sched and avs for pt for April and May.Marland KitchenMarland KitchenMarland KitchenPt sched with Dr. Dr. Barkley Bruns on 4.19 @ 4pm

## 2015-01-06 ENCOUNTER — Encounter: Payer: Self-pay | Admitting: General Surgery

## 2015-01-06 NOTE — Progress Notes (Signed)
Whitney Santiago 01/06/2015 4:11 PM Location: [facility] Patient #: [id number] DOB: [birth year] Divorced / Language: English / Race: Black or African American Female History of Present Illness Odis Hollingshead MD; 01/06/2015 5:57 PM) The patient is a 54 year old female who presents with a complaint of lymphoma. Lymphoma  Note:She is referred by Dr. Osker Mason because of axillary adenopathy and desire for lymph node biopsy. She has a history of Hodgkin's lymphoma dating back to 2000. This was treated and she's not had any recurrence. She sustained a left shoulder injury. An MRI was performed which demonstrated enlarged left axillary lymph nodes. Ultrasound of that area also demonstrated multiple enlarged lymph nodes. She subsequently saw Dr. Osker Mason and a PET scan was ordered. This demonstrated bilateral axillary hypermetabolic adenopathy more extensive on the right side. Because of this and her previous history was asked to see her and discuss lymph node biopsy. She has no night sweats or fevers. Energy level is good. No fatigue. No change in appetite or weight loss.  Other Problems Jeralyn Ruths, [place]; 01/06/2015 4:11 PM) High blood pressure  Past Surgical History Jeralyn Ruths, [place]; 01/06/2015 4:11 PM) Breast Biopsy Left. Colon Polyp Removal - Colonoscopy  Diagnostic Studies History Jeralyn Ruths, [place]; 01/06/2015 4:11 PM) Colonoscopy >10 years ago Mammogram within last year  Allergies Jeralyn Ruths, CMA; 01/06/2015 4:17 PM) HYDROcodone Bitartrate *CHEMICALS* Hives. OxyCODONE HCl (Abuse Deter) *ANALGESICS - OPIOID* Hives.  Medication History Jeralyn Ruths, CMA; 01/06/2015 4:15 PM) Calcium-Vitamin D (900MG  Capsule, 1 Oral daily) Active. (Pt takes 2000 U of Vitamin D) HydroDiuril (25MG  Tablet, 1 Oral daily) Active.  Social History Jeralyn Ruths, [place]; 01/06/2015 4:11 PM) Alcohol use Occasional alcohol use. Caffeine use Coffee, Tea. No drug use Tobacco  use Never smoker.  Family History Jeralyn Ruths, [place]; 01/06/2015 4:11 PM) Cerebrovascular Accident Mother. Diabetes Mellitus Father. Hypertension Brother, Daughter, Mother, Sister.  Pregnancy / Birth History Jeralyn Ruths, [place]; 01/06/2015 4:11 PM) Age at menarche 42 years. Age of menopause <47 Gravida 2 Maternal age 96-20 Para 2     Review of Systems ([place]; 01/06/2015 4:11 PM) General Not Present- Appetite Loss, Chills, Fatigue, Fever, Night Sweats, Weight Gain and Weight Loss. Skin Not Present- Change in Wart/Mole, Dryness, Hives, Jaundice, New Lesions, Non-Healing Wounds, Rash and Ulcer. HEENT Not Present- Earache, Hearing Loss, Hoarseness, Nose Bleed, Oral Ulcers, Ringing in the Ears, Seasonal Allergies, Sinus Pain, Sore Throat, Visual Disturbances, Wears glasses/contact lenses and Yellow Eyes. Respiratory Not Present- Bloody sputum, Chronic Cough, Difficulty Breathing, Snoring and Wheezing. Breast Not Present- Breast Mass, Breast Pain, Nipple Discharge and Skin Changes. Cardiovascular Not Present- Chest Pain, Difficulty Breathing Lying Down, Leg Cramps, Palpitations, Rapid Heart Rate, Shortness of Breath and Swelling of Extremities. Gastrointestinal Not Present- Abdominal Pain, Bloating, Bloody Stool, Change in Bowel Habits, Chronic diarrhea, Constipation, Difficulty Swallowing, Excessive gas, Gets full quickly at meals, Hemorrhoids, Indigestion, Nausea, Rectal Pain and Vomiting. Female Genitourinary Not Present- Frequency, Nocturia, Painful Urination, Pelvic Pain and Urgency. Musculoskeletal Not Present- Back Pain, Joint Pain, Joint Stiffness, Muscle Pain, Muscle Weakness and Swelling of Extremities. Neurological Not Present- Decreased Memory, Fainting, Headaches, Numbness, Seizures, Tingling, Tremor, Trouble walking and Weakness. Psychiatric Not Present- Anxiety, Bipolar, Change in Sleep Pattern, Depression, Fearful and Frequent crying. Endocrine Not Present-  Cold Intolerance, Excessive Hunger, Hair Changes, Heat Intolerance, Hot flashes and New Diabetes. Hematology Not Present- Easy Bruising, Excessive bleeding, Gland problems, HIV and Persistent Infections.  Vitals Jearld Fenton Morris CMA; 01/06/2015 4:18 PM) 01/06/2015 4:17 PM Weight: 223.8 lb  Height: 63in Body Surface Area: 2.12 m Body Mass Index: 39.64 kg/m Temp.: 98.63F(Oral)  Pulse: 82 (Regular)  Resp.: 14 (Unlabored)  BP: 138/88 (Sitting, Left Arm, Standard)     Physical Exam Odis Hollingshead MD; 01/06/2015 5:58 PM)  The physical exam findings are as follows: Note:General: Overweight female in NAD. Pleasant and cooperative.  HEENT: [place]/AT, no facial masses  EYES: EOMI, no icterus  NECK: Supple, no obvious mass.  LYMPHATIC: No palpable cervical or supraclavicular adenopathy. In the right axilla, there is a 2.5-3 cm firm palpable mass present. It is mildly mobile. No obvious left axillary adenopathy.  SKIN: No jaundice or suspicious rashes.  NEUROLOGIC: Alert and oriented, answers questions appropriately, normal gait and station.  PSYCHIATRIC: Normal mood, affect , and behavior.    Assessment & Plan Odis Hollingshead MD; 01/06/2015 5:59 PM)  LYMPHADENOPATHY, AXILLARY (785.6  R59.0) Impression: Hypermetabolic lymphadenopathy on PET scan right side greater than left side. She has a history of Hodgkin's lymphoma.  Plan: Right axillary lymph node biopsy. The procedure and the risks were discussed with her. The risks include but are not limited to bleeding, infection, wound healing problem, anesthesia, nerve injury, lymphedema. We also discussed the fact that I may not be able to get adequate tissue and she may have to undergo an image guided needle biopsy. She seems to understand all of this and would like to proceed.  Current Plans Follow up as needed Schedule for Surgery Free Text Instructions : discussed with patient and provided information.  Jackolyn Confer, MD

## 2015-01-09 ENCOUNTER — Ambulatory Visit (INDEPENDENT_AMBULATORY_CARE_PROVIDER_SITE_OTHER): Payer: BLUE CROSS/BLUE SHIELD | Admitting: Family Medicine

## 2015-01-09 ENCOUNTER — Ambulatory Visit: Payer: BLUE CROSS/BLUE SHIELD | Admitting: Family Medicine

## 2015-01-09 ENCOUNTER — Encounter: Payer: Self-pay | Admitting: Family Medicine

## 2015-01-09 VITALS — BP 130/92 | HR 90 | Temp 98.3°F | Ht 63.0 in | Wt 219.8 lb

## 2015-01-09 DIAGNOSIS — R7309 Other abnormal glucose: Secondary | ICD-10-CM | POA: Diagnosis not present

## 2015-01-09 DIAGNOSIS — Z6839 Body mass index (BMI) 39.0-39.9, adult: Secondary | ICD-10-CM

## 2015-01-09 DIAGNOSIS — I10 Essential (primary) hypertension: Secondary | ICD-10-CM | POA: Insufficient documentation

## 2015-01-09 DIAGNOSIS — C8190 Hodgkin lymphoma, unspecified, unspecified site: Secondary | ICD-10-CM | POA: Diagnosis not present

## 2015-01-09 DIAGNOSIS — E669 Obesity, unspecified: Secondary | ICD-10-CM | POA: Insufficient documentation

## 2015-01-09 DIAGNOSIS — E785 Hyperlipidemia, unspecified: Secondary | ICD-10-CM | POA: Insufficient documentation

## 2015-01-09 DIAGNOSIS — R7303 Prediabetes: Secondary | ICD-10-CM

## 2015-01-09 LAB — LIPID PANEL
CHOLESTEROL: 198 mg/dL (ref 0–200)
HDL: 52.5 mg/dL (ref >39.00)
LDL Cholesterol: 133 mg/dL — ABNORMAL HIGH (ref 0–99)
NONHDL: 145.5
TRIGLYCERIDES: 62 mg/dL (ref 0.0–149.0)
Total CHOL/HDL Ratio: 4
VLDL: 12.4 mg/dL (ref 0.0–40.0)

## 2015-01-09 LAB — BASIC METABOLIC PANEL
BUN: 16 mg/dL (ref 6–23)
CO2: 30 meq/L (ref 19–32)
Calcium: 9.4 mg/dL (ref 8.4–10.5)
Chloride: 101 mEq/L (ref 96–112)
Creatinine, Ser: 0.88 mg/dL (ref 0.40–1.20)
GFR: 86.79 mL/min (ref >60.00)
Glucose, Bld: 117 mg/dL — ABNORMAL HIGH (ref 70–99)
Potassium: 3.3 mEq/L — ABNORMAL LOW (ref 3.5–5.1)
Sodium: 137 mEq/L (ref 135–145)

## 2015-01-09 LAB — HEMOGLOBIN A1C: HEMOGLOBIN A1C: 5.9 % (ref 4.6–6.5)

## 2015-01-09 MED ORDER — LISINOPRIL 5 MG PO TABS: 5.0000 mg | ORAL_TABLET | Freq: Every day | ORAL | Status: DC

## 2015-01-09 MED ORDER — HYDROCHLOROTHIAZIDE 25 MG PO TABS: 25.0000 mg | ORAL_TABLET | Freq: Every day | ORAL | Status: DC

## 2015-01-09 NOTE — Patient Instructions (Addendum)
BEFORE YOU LEAVE: -labs -schedule follow up for your blood pressure in 1-2 months  Please continue the hydrochlorhthiazide and start lisinopril 5 mg daily  We recommend the following healthy lifestyle measures: - eat a healthy diet consisting of lots of vegetables, fruits, beans, nuts, seeds, healthy meats such as white chicken and fish and whole grains.  - avoid fried foods, fast food, processed foods, sodas, red meet and other fattening foods.  - get a least 150 minutes of aerobic exercise per week.   -We have ordered labs or studies at this visit. It can take up to 1-2 weeks for results and processing. We will contact you with instructions IF your results are abnormal. Normal results will be released to your [facility]. If you have not heard from [place] or can not find your results in [facility] in 2 weeks please contact our office.

## 2015-01-09 NOTE — Progress Notes (Signed)
HPI: Whitney Santiago is a pleasant 54 yo F here for follow up:  Axillary LAD: -she has axillay lymphadenopathy and is scheduled for biopsy to determine if this is recurrence of her lymphoma -denies: CP, SOB, fever, malaise, unintentional weight loss  Hypertension: -meds: hctz - boyfriend's mother died (she was the caregiver) -she has been missing doses here and there  -reports BP has been elevated on several occassions elsewhere -denies: CP, SOB, DOE, swelling  Obesity/Prediabtes/HLD: -lifestyle recs advised - she reports walking some but not much; she has made some changes in her diet and has been able to loose some weight -denies: polyuria, polydipsia, vision changes  ROS: See pertinent positives and negatives per HPI.  Past Medical History  Diagnosis Date  . Cancer 1999    Non Hodgkins Lymphoma  . Hodgkin disease     lymphocytic predominant Hodkin's 2000, s/p 4 weeks Rituxan, chlorambucil 10/21/99-04/19/00  . History of migraine   . Hypertension   . Allergy   . Depression   . Hx of iron deficiency anemia     per [facility] notes, since childhood, possible thalassemia    Past Surgical History  Procedure Laterality Date  . Laparascop    . Laparoscopic removal abdominal mass  2000    LLQ  . Breast surgery Left     due to bloody discharge    Family History  Problem Relation Age of Onset  . Hypertension Mother   . Stroke Mother   . Hypertension Father   . Diabetes Father   . Hypertension Sister   . Hypertension Brother   . Hypertension Daughter     History   Social History  . Marital Status: Divorced    Spouse Name: N/A  . Number of Children: N/A  . Years of Education: N/A   Social History Main Topics  . Smoking status: Never Smoker   . Smokeless tobacco: Never Used  . Alcohol Use: No  . Drug Use: No  . Sexual Activity: Not on file   Other Topics Concern  . 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  Calcium Carb-Cholecalciferol (CALCIUM + D3 PO), Take by mouth daily., Disp: , Rfl:  .  Cholecalciferol (VITAMIN D3) 2000 UNITS TABS, Take by mouth daily., Disp: , Rfl:  .  hydrochlorothiazide (HYDRODIURIL) 25 MG tablet, Take 1 tablet (25 mg total) by mouth daily., Disp: 90 tablet, Rfl: 3 .  lisinopril (PRINIVIL,ZESTRIL) 5 MG tablet, Take 1 tablet (5 mg total) by mouth daily., Disp: 90 tablet, Rfl: 3  EXAM:  Filed Vitals:   01/09/15 0802  BP: 130/92  Pulse: 90  Temp: 98.3 F (36.8 C)    Body mass index is 38.95 kg/(m^2).  GENERAL: vitals reviewed and listed above, alert, oriented, appears well hydrated and in no acute distress  HEENT: atraumatic, conjunttiva clear, no obvious abnormalities on inspection of external nose and ears  NECK: no obvious masses on inspection  LUNGS: clear to auscultation bilaterally, no wheezes, rales or rhonchi, good air movement  CV: HRRR, no peripheral edema  MS: moves all extremities without noticeable abnormality  PSYCH: pleasant and cooperative, no obvious depression or anxiety  ASSESSMENT AND PLAN:  Discussed the following assessment and plan:  Hodgkin disease, Hx of 2000, treated at [facility] -now with axillary adenopathy, undergoing biopsy soon for this and followed by [facility] and [facility]  Essential hypertension - Plan: Basic metabolic panel, lisinopril (PRINIVIL,ZESTRIL) 5 MG tablet, hydrochlorothiazide (HYDRODIURIL) 25 MG tablet -opted to add low dose lisinopril after discussion of options and BP has been running up a bit -discussed risks -follow up in 1-2 months to recheck  BMI 39.0-39.9,adult -lifestyle recs  Hyperlipemia - Plan: Lipid Panel -recheck today  Prediabetes - Plan: Hemoglobin A1c -recheck today  -Patient advised to return or notify a  doctor immediately if symptoms worsen or persist or new concerns arise.  Patient Instructions  BEFORE YOU LEAVE: -labs -schedule follow up for your blood pressure in 1-2 months  Please continue the hydrochlorhthiazide and start lisinopril 5 mg daily  We recommend the following healthy lifestyle measures: - eat a healthy diet consisting of lots of vegetables, fruits, beans, nuts, seeds, healthy meats such as white chicken and fish and whole grains.  - avoid fried foods, fast food, processed foods, sodas, red meet and other fattening foods.  - get a least 150 minutes of aerobic exercise per week.       Colin Benton R.

## 2015-01-09 NOTE — Progress Notes (Signed)
Pre visit review using our clinic review tool, if applicable. No additional management support is needed unless otherwise documented below in the visit note. 

## 2015-01-23 ENCOUNTER — Ambulatory Visit: Payer: BLUE CROSS/BLUE SHIELD | Admitting: Oncology

## 2015-02-19 ENCOUNTER — Ambulatory Visit (INDEPENDENT_AMBULATORY_CARE_PROVIDER_SITE_OTHER): Payer: BLUE CROSS/BLUE SHIELD | Admitting: Family Medicine

## 2015-02-19 ENCOUNTER — Encounter: Payer: Self-pay | Admitting: Family Medicine

## 2015-02-19 DIAGNOSIS — Z6839 Body mass index (BMI) 39.0-39.9, adult: Secondary | ICD-10-CM | POA: Diagnosis not present

## 2015-02-19 DIAGNOSIS — E785 Hyperlipidemia, unspecified: Secondary | ICD-10-CM

## 2015-02-19 DIAGNOSIS — I10 Essential (primary) hypertension: Secondary | ICD-10-CM | POA: Diagnosis not present

## 2015-02-19 NOTE — Progress Notes (Signed)
Pre visit review using our clinic review tool, if applicable. No additional management support is needed unless otherwise documented below in the visit note. 

## 2015-02-19 NOTE — Patient Instructions (Addendum)
BEFORE YOU LEAVE: -follow up in 3 months - come fasting  Please call your surgeon office to schedule you biopsy  We recommend the following healthy lifestyle measures: - eat a healthy diet consisting of lots of vegetables, fruits, beans, nuts, seeds, healthy meats such as white chicken and fish  - avoid fried foods, starches, sweets, fast food, processed foods, sodas, red meet and other fattening foods.  - get a least 150 minutes of aerobic exercise per week.

## 2015-02-19 NOTE — Progress Notes (Signed)
HPI:  Whitney Santiago is a pleasant 54 yo F here for follow up:  Hypertension: -meds: hctz, added lisinopril 12/2014 -she has been missing doses here and there  -reports BP has been elevated on several occassions elsewhere -denies: CP, SOB, DOE, swelling  Obesity/Prediabtes/HLD: -lifestyle recs advised - she reports walking some but not much; she has made some changes in her diet and has been able to loose some weight -denies: polyuria, polydipsia, vision changes  Axillary LAD: -non-hodkin lymphoma -she has axillay lymphadenopathy  -seeing onc and [facility] for this and biopsy was advised -reports: she has not received a call yet to schedule, she wanted to do this in June -denies: CP, SOB, fever, malaise, unintentional weight loss   ROS: See pertinent positives and negatives per HPI.  Past Medical History  Diagnosis Date  . Cancer 1999    Non Hodgkins Lymphoma  . Hodgkin disease     lymphocytic predominant Hodkin's 2000, s/p 4 weeks Rituxan, chlorambucil 10/21/99-04/19/00  . History of migraine   . Hypertension   . Allergy   . Depression   . Hx of iron deficiency anemia     per [facility] notes, since childhood, possible thalassemia    Past Surgical History  Procedure Laterality Date  . Laparascop    . Laparoscopic removal abdominal mass  2000    LLQ  . Breast surgery Left     due to bloody discharge    Family History  Problem Relation Age of Onset  . Hypertension Mother   . Stroke Mother   . Hypertension Father   . Diabetes Father   . Hypertension Sister   . Hypertension Brother   . Hypertension Daughter     History   Social History  . Marital Status: Divorced    Spouse Name: N/A  . Number of Children: N/A  . Years of Education: N/A   Social History Main Topics  . Smoking status: Never Smoker   . Smokeless tobacco: Never Used  . Alcohol Use: No  . Drug Use: No  . Sexual Activity: Not on file   Other Topics Concern  . 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  Calcium Carb-Cholecalciferol (CALCIUM + D3 PO), Take by mouth daily., Disp: , Rfl:  .  Cholecalciferol (VITAMIN D3) 2000 UNITS TABS, Take by mouth daily., Disp: , Rfl:  .  hydrochlorothiazide (HYDRODIURIL) 25 MG tablet, Take 1 tablet (25 mg total) by mouth daily., Disp: 90 tablet, Rfl: 3 .  lisinopril (PRINIVIL,ZESTRIL) 5 MG tablet, Take 1 tablet (5 mg total) by mouth daily., Disp: 90 tablet, Rfl: 3  EXAM:  Filed Vitals:   02/19/15 1548  BP: 126/88  Pulse: 96  Temp: 98 F (36.7 C)    Body mass index is 39.25 kg/(m^2).  GENERAL: vitals reviewed and listed above, alert, oriented, appears well hydrated and in no acute distress  HEENT: atraumatic, conjunttiva clear, no obvious abnormalities on inspection of external nose and ears  NECK: no obvious masses on inspection  LUNGS: clear to auscultation bilaterally, no wheezes, rales or rhonchi, good air movement  CV: HRRR, no peripheral edema  MS: moves all extremities without  noticeable abnormality  PSYCH: pleasant and cooperative, no obvious depression or anxiety  ASSESSMENT AND PLAN:  Discussed the following assessment and plan:  Essential hypertension  BMI 39.0-39.9,adult  Hyperlipemia  -lifestyle recs at length, BP better on recheck -advised to call her surgery office to ensure biopsy scheduled - offered to but she agrees to do this  Husband and son want PCP. Margarita Mail - agreed to take if they only want to see me.  -Patient advised to return or notify a doctor immediately if symptoms worsen or persist or new concerns arise.  Patient Instructions  BEFORE YOU LEAVE: -follow up in 3 months  We recommend the following healthy lifestyle measures: - eat a healthy  diet consisting of lots of vegetables, fruits, beans, nuts, seeds, healthy meats such as white chicken and fish  - avoid fried foods, starches, sweets, fast food, processed foods, sodas, red meet and other fattening foods.  - get a least 150 minutes of aerobic exercise per week.       Colin Benton R.

## 2015-06-17 ENCOUNTER — Ambulatory Visit: Payer: BLUE CROSS/BLUE SHIELD | Admitting: Family Medicine

## 2015-06-19 ENCOUNTER — Ambulatory Visit (INDEPENDENT_AMBULATORY_CARE_PROVIDER_SITE_OTHER): Payer: BLUE CROSS/BLUE SHIELD | Admitting: Family Medicine

## 2015-06-19 ENCOUNTER — Telehealth: Payer: Self-pay | Admitting: Family Medicine

## 2015-06-19 ENCOUNTER — Encounter: Payer: Self-pay | Admitting: Family Medicine

## 2015-06-19 VITALS — BP 124/82 | HR 77 | Temp 97.5°F | Ht 63.0 in | Wt 225.1 lb

## 2015-06-19 DIAGNOSIS — R739 Hyperglycemia, unspecified: Secondary | ICD-10-CM | POA: Insufficient documentation

## 2015-06-19 DIAGNOSIS — Z6839 Body mass index (BMI) 39.0-39.9, adult: Secondary | ICD-10-CM

## 2015-06-19 DIAGNOSIS — R590 Localized enlarged lymph nodes: Secondary | ICD-10-CM

## 2015-06-19 DIAGNOSIS — I10 Essential (primary) hypertension: Secondary | ICD-10-CM | POA: Diagnosis not present

## 2015-06-19 DIAGNOSIS — E785 Hyperlipidemia, unspecified: Secondary | ICD-10-CM

## 2015-06-19 DIAGNOSIS — R0982 Postnasal drip: Secondary | ICD-10-CM | POA: Diagnosis not present

## 2015-06-19 DIAGNOSIS — C8190 Hodgkin lymphoma, unspecified, unspecified site: Secondary | ICD-10-CM

## 2015-06-19 DIAGNOSIS — Z23 Encounter for immunization: Secondary | ICD-10-CM | POA: Diagnosis not present

## 2015-06-19 DIAGNOSIS — Z1159 Encounter for screening for other viral diseases: Secondary | ICD-10-CM

## 2015-06-19 DIAGNOSIS — F329 Major depressive disorder, single episode, unspecified: Secondary | ICD-10-CM | POA: Insufficient documentation

## 2015-06-19 LAB — LIPID PANEL
Cholesterol: 228 mg/dL — ABNORMAL HIGH (ref 0–200)
HDL: 55.4 mg/dL (ref >39.00)
LDL Cholesterol: 158 mg/dL — ABNORMAL HIGH (ref 0–99)
NONHDL: 172.31
Total CHOL/HDL Ratio: 4
Triglycerides: 73 mg/dL (ref 0.0–149.0)
VLDL: 14.6 mg/dL (ref 0.0–40.0)

## 2015-06-19 LAB — BASIC METABOLIC PANEL
BUN: 14 mg/dL (ref 6–23)
CO2: 31 mEq/L (ref 19–32)
Calcium: 10.3 mg/dL (ref 8.4–10.5)
Chloride: 101 mEq/L (ref 96–112)
Creatinine, Ser: 0.8 mg/dL (ref 0.40–1.20)
GFR: 96.71 mL/min (ref >60.00)
Glucose, Bld: 95 mg/dL (ref 70–99)
POTASSIUM: 3.9 meq/L (ref 3.5–5.1)
SODIUM: 139 meq/L (ref 135–145)

## 2015-06-19 LAB — HEMOGLOBIN A1C: Hgb A1c MFr Bld: 5.9 % (ref 4.6–6.5)

## 2015-06-19 NOTE — Progress Notes (Signed)
Pre visit review using our clinic review tool, if applicable. No additional management support is needed unless otherwise documented below in the visit note. 

## 2015-06-19 NOTE — Progress Notes (Signed)
HPI:  PND: -for several months -along with nasal congestion, sneezing, scratchy throat -denies: fevers, malaise, SOB, dysphagia, sinus pain  Hypertension: -meds: hctz, added lisinopril 12/2014 -denies: CP, SOB, DOE, swelling  Obesity/Prediabtes/HLD: -not exercising and reports diet not great -denies: polyuria, polydipsia, vision changes  Axillary LAD: -non-hodkin lymphoma -she has axillay lymphadenopathy  -seeing onc and [facility] for this and biopsy was advised -reports: she has not scheduled yet due to conflict with work but promises she will call her surgeon today -denies: CP, SOB, fever, malaise, unintentional weight loss   ROS: See pertinent positives and negatives per HPI.  Past Medical History  Diagnosis Date  . Hyperlipemia   . Hodgkin disease 1999    lymphocytic predominant Hodkin's 2000, s/p 4 weeks Rituxan, chlorambucil 10/21/99-04/19/00  . History of migraine   . Hypertension   . Allergy   . Depression   . Hx of iron deficiency anemia     per [facility] notes, since childhood, possible thalassemia  . Hyperglycemia   . Obesity   . LAD (lymphadenopathy), axillary     eval with onc and [facility] 2016    Past Surgical History  Procedure Laterality Date  . Laparascop    . Laparoscopic removal abdominal mass  2000    LLQ  . Breast surgery Left     due to bloody discharge    Family History  Problem Relation Age of Onset  . Hypertension Mother   . Stroke Mother   . Hypertension Father   . Diabetes Father   . Hypertension Sister   . Hypertension Brother   . Hypertension Daughter     Social History   Social History  . Marital Status: Divorced    Spouse Name: N/A  . Number of Children: N/A  . Years of Education: N/A   Social History Main Topics  . Smoking status: Never Smoker   . Smokeless tobacco: Never Used  . Alcohol Use: No  . Drug Use: No  . Sexual Activity: Not Asked   Other Topics Concern  . 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  Calcium Carb-Cholecalciferol (CALCIUM + D3 PO), Take by mouth daily., Disp: , Rfl:  .  Cholecalciferol (VITAMIN D3) 2000 UNITS TABS, Take by mouth daily., Disp: , Rfl:  .  hydrochlorothiazide (HYDRODIURIL) 25 MG tablet, Take 1 tablet (25 mg total) by mouth daily., Disp: 90 tablet, Rfl: 3 .  lisinopril (PRINIVIL,ZESTRIL) 5 MG tablet, Take 1 tablet (5 mg total) by mouth daily., Disp: 90 tablet, Rfl: 3  EXAM:  Filed Vitals:   06/19/15 1300  BP: 124/82  Pulse: 77  Temp: 97.5 F (36.4 C)    Body mass index is 39.88 kg/(m^2).  GENERAL: vitals reviewed and listed above, alert, oriented, appears well hydrated and in no acute distress  HEENT: atraumatic, conjunttiva clear, no obvious abnormalities on inspection of external nose and ears, normal appearance of ear canals and TMs but with clear effusion bilat, clear nasal congestion, boggy turbinates, mild post oropharyngeal erythema with PND, no tonsillar edema or exudate, no sinus TTP  NECK: no obvious masses on inspection  LUNGS: clear to auscultation bilaterally, no  wheezes, rales or rhonchi, good air movement  CV: HRRR, no peripheral edema  MS: moves all extremities without noticeable abnormality  PSYCH: pleasant and cooperative, no obvious depression or anxiety  ASSESSMENT AND PLAN:  Discussed the following assessment and plan:  PND (post-nasal drip)  Essential hypertension - Plan: Basic metabolic panel  BMI 87.5-64.3,PIRJJ  Hyperglycemia - Plan: Hemoglobin A1c  Hyperlipemia - Plan: Lipid Panel  LAD (lymphadenopathy), axillary  Hodgkin disease, Hx of 2000, treated at [facility]  Need for hepatitis C screening test - Plan: Hep C Antibody  -Patient advised to return or notify a doctor  immediately if symptoms worsen or persist or new concerns arise.  Patient Instructions  BEFORE YOU LEAVE: -flu shot -labs -schedule physical in next 3-4 months  CALL your surgeon TODAY  Start flonase (available over the counter) 2 sprays each nostril daily for 1 month, then 1 spray each nostril daily - if symptoms no gone in 1 month please follow up with me  We recommend the following healthy lifestyle measures: - eat a healthy whole foods diet consisting of regular small meals composed of vegetables, fruits, beans, nuts, seeds, healthy meats such as white chicken and fish and whole grains.  - avoid sweets, white starchy foods, fried foods, fast food, processed foods, sodas, red meet and other fattening foods.  - get a least 150-300 minutes of aerobic exercise per week.   -We have ordered labs or studies at this visit. It can take up to 1-2 weeks for results and processing. We will contact you with instructions IF your results are abnormal. Normal results will be released to your [facility]. If you have not heard from [place] or can not find your results in [facility] in 2 weeks please contact our office.            Colin Benton R.

## 2015-06-19 NOTE — Telephone Encounter (Signed)
Pt called in to advise that she made an appt to see her surgeon on Thursday.

## 2015-06-19 NOTE — Patient Instructions (Signed)
BEFORE YOU LEAVE: -flu shot -labs -schedule physical in next 3-4 months  CALL your surgeon TODAY  Start flonase (available over the counter) 2 sprays each nostril daily for 1 month, then 1 spray each nostril daily - if symptoms no gone in 1 month please follow up with me  We recommend the following healthy lifestyle measures: - eat a healthy whole foods diet consisting of regular small meals composed of vegetables, fruits, beans, nuts, seeds, healthy meats such as white chicken and fish and whole grains.  - avoid sweets, white starchy foods, fried foods, fast food, processed foods, sodas, red meet and other fattening foods.  - get a least 150-300 minutes of aerobic exercise per week.   -We have ordered labs or studies at this visit. It can take up to 1-2 weeks for results and processing. We will contact you with instructions IF your results are abnormal. Normal results will be released to your [facility]. If you have not heard from [place] or can not find your results in [facility] in 2 weeks please contact our office.

## 2015-06-19 NOTE — Telephone Encounter (Signed)
Dr [name] is aware of this.

## 2015-10-21 ENCOUNTER — Encounter: Payer: BLUE CROSS/BLUE SHIELD | Admitting: Family Medicine

## 2016-01-11 ENCOUNTER — Other Ambulatory Visit: Payer: Self-pay | Admitting: Family Medicine

## 2016-03-05 ENCOUNTER — Encounter (HOSPITAL_COMMUNITY): Payer: Self-pay | Admitting: Emergency Medicine

## 2016-03-05 ENCOUNTER — Ambulatory Visit (HOSPITAL_COMMUNITY)
Admission: EM | Admit: 2016-03-05 | Discharge: 2016-03-05 | Disposition: A | Discharge status: Home / Self Care | Payer: BLUE CROSS/BLUE SHIELD | Attending: Emergency Medicine | Admitting: Emergency Medicine

## 2016-03-05 DIAGNOSIS — J309 Allergic rhinitis, unspecified: Secondary | ICD-10-CM

## 2016-03-05 MED ORDER — PREDNISONE 10 MG PO TABS: ORAL_TABLET | ORAL | Status: DC

## 2016-03-05 NOTE — ED Provider Notes (Signed)
CSN: [id number]     Arrival date & time 03/05/16  1635 History   First MD Initiated Contact with Patient 03/05/16 1847     Chief Complaint  Patient presents with  . Cough   (Consider location/radiation/quality/duration/timing/severity/associated sxs/prior Treatment) Patient is a 55 y.o. female presenting with cough. The history is provided by the patient. No language interpreter was used.  Cough Cough characteristics:  Productive Sputum characteristics:  Nondescript Severity:  Mild Onset quality:  Gradual Timing:  Constant Progression:  Worsening Chronicity:  New Smoker: no   Context: exposure to allergens and fumes   Relieved by:  Nothing Worsened by:  Nothing tried Ineffective treatments:  None tried Associated symptoms: no shortness of breath   Risk factors: no recent infection     Past Medical History  Diagnosis Date  . Hyperlipemia   . 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 History of migraine   . Hypertension   . Allergy   . Depression   . Hx of iron deficiency anemia     per [facility] notes, since childhood, possible thalassemia  . Hyperglycemia   . Obesity   . LAD (lymphadenopathy), axillary     eval with onc and gsu 2016   Past Surgical History  Procedure Laterality Date  . Laparascop    . Laparoscopic removal abdominal mass  2000    LLQ  . Breast surgery Left     due to bloody discharge   Family History  Problem Relation Age of Onset  . Hypertension Mother   . Stroke Mother   . Hypertension Father   . Diabetes Father   . Hypertension Sister   . Hypertension Brother   . Hypertension Daughter    Social History  Substance Use Topics  . Smoking status: Never Smoker   . Smokeless tobacco: Never Used  . Alcohol Use: No   OB History    No data available     Review of Systems  Respiratory: Positive for cough. Negative for shortness of breath.   All other systems  reviewed and are negative.   Allergies  Hydrocodone and Oxycodone  Home Medications   Prior to Admission medications   Medication Sig Start Date End Date Taking? Authorizing Provider  Calcium Carb-Cholecalciferol (CALCIUM + D3 PO) Take by mouth daily.    Historical Provider, MD  Cholecalciferol (VITAMIN D3) 2000 UNITS TABS Take by mouth daily.    Historical Provider, MD  hydrochlorothiazide (HYDRODIURIL) 25 MG tablet TAKE 1 TABLET (25 MG TOTAL) BY MOUTH DAILY. 01/11/16   Lucretia Kern, DO  lisinopril (PRINIVIL,ZESTRIL) 5 MG tablet TAKE 1 TABLET (5 MG TOTAL) BY MOUTH DAILY. 01/11/16   Lucretia Kern, DO  predniSONE (DELTASONE) 10 MG tablet 6,5,4,3,2,1 taper 03/05/16   Fransico Meadow, PA-C   Meds Ordered and Administered this Visit  Medications - No data to display  BP 157/87 mmHg  Pulse 79  Temp(Src) 98 F (36.7 C) (Oral)  Resp 18  SpO2 100%  LMP 10/08/2013 No data found.   Physical Exam  Constitutional: She is oriented to person, place, and time. She appears well-developed and well-nourished.  HENT:  Head: Normocephalic and atraumatic.  Right Ear: External ear normal.  Left Ear: External ear normal.  Mouth/Throat: Oropharynx is clear and moist.  Eyes: Conjunctivae and EOM are normal. Pupils are equal, round, and reactive to light.  Neck: Normal range of motion.  Cardiovascular: Normal rate and  normal heart sounds.   Pulmonary/Chest: Effort normal.  Abdominal: Soft. She exhibits no distension.  Musculoskeletal: Normal range of motion.  Neurological: She is alert and oriented to person, place, and time.  Skin: Skin is warm.  Psychiatric: She has a normal mood and affect.  Nursing note and vitals reviewed.   ED Course  Procedures (including critical care time)  Labs Review Labs Reviewed - No data to display  Imaging Review No results found.   Visual Acuity Review  Right Eye Distance:   Left Eye Distance:   Bilateral Distance:    Right Eye Near:   Left Eye Near:     Bilateral Near:         MDM  I suspect pt's symptoms are allergic.   I will treat with prednisone   1. Multiple respiratory allergies    Meds ordered this encounter  Medications  . predniSONE (DELTASONE) 10 MG tablet    Sig: 6,5,4,3,2,1 taper    Dispense:  21 tablet    Refill:  0    Order Specific Question:  Supervising Provider    Answer:  Ihor Gully D [place], PA-C 03/05/16 2243

## 2016-03-05 NOTE — Discharge Instructions (Signed)
Allergies An allergy is an abnormal reaction to a substance by the body's defense system (immune system). Allergies can develop at any age. WHAT CAUSES ALLERGIES? An allergic reaction happens when the immune system mistakenly reacts to a normally harmless substance, called an allergen, as if it were harmful. The immune system releases antibodies to fight the substance. Antibodies eventually release a chemical called histamine into the bloodstream. The release of histamine is meant to protect the body from infection, but it also causes discomfort. An allergic reaction can be triggered by:  Eating an allergen.  Inhaling an allergen.  Touching an allergen. WHAT TYPES OF ALLERGIES ARE THERE? There are many types of allergies. Common types include:  Seasonal allergies. People with this type of allergy are usually allergic to substances that are only present during certain seasons, such as molds and pollens.  Food allergies.  Drug allergies.  Insect allergies.  Animal dander allergies. WHAT ARE SYMPTOMS OF ALLERGIES? Possible allergy symptoms include:  Swelling of the lips, face, tongue, mouth, or throat.  Sneezing, coughing, or wheezing.  Nasal congestion.  Tingling in the mouth.  Rash.  Itching.  Itchy, red, swollen areas of skin (hives).  Watery eyes.  Vomiting.  Diarrhea.  Dizziness.  Lightheadedness.  Fainting.  Trouble breathing or swallowing.  Chest tightness.  Rapid heartbeat. HOW ARE ALLERGIES DIAGNOSED? Allergies are diagnosed with a medical and family history and one or more of the following:  Skin tests.  Blood tests.  A food diary. A food diary is a record of all the foods and drinks you have in a day and of all the symptoms you experience.  The results of an elimination diet. An elimination diet involves eliminating foods from your diet and then adding them back in one by one to find out if a certain food causes an allergic reaction. HOW ARE  ALLERGIES TREATED? There is no cure for allergies, but allergic reactions can be treated with medicine. Severe reactions usually need to be treated at a hospital. HOW CAN REACTIONS BE PREVENTED? The best way to prevent an allergic reaction is by avoiding the substance you are allergic to. Allergy shots and medicines can also help prevent reactions in some cases. People with severe allergic reactions may be able to prevent a life-threatening reaction called anaphylaxis with a medicine given right after exposure to the allergen.   This information is not intended to replace advice given to you by your health care provider. Make sure you discuss any questions you have with your health care provider.   Document Released: 11/29/2002 Document Revised: 09/26/2014 Document Reviewed: 06/17/2014 Elsevier Interactive Patient Education 2016 Elsevier [employer] Laryngitis Laryngitis is inflammation of your vocal cords. This causes hoarseness, coughing, loss of voice, sore throat, or a dry throat. Your vocal cords are two bands of muscles that are found in your throat. When you speak, these cords come together and vibrate. These vibrations come out through your mouth as sound. When your vocal cords are inflamed, your voice sounds different. Laryngitis can be temporary (acute) or long-term (chronic). Most cases of acute laryngitis improve with time. Chronic laryngitis is laryngitis that lasts for more than three weeks. CAUSES Acute laryngitis may be caused by:  A viral infection.  Lots of talking, yelling, or singing. This is also called vocal strain.  Bacterial infections. Chronic laryngitis may be caused by:  Vocal strain.  Injury to your vocal cords.  Acid reflux (gastroesophageal reflux disease or GERD).  Allergies.  Sinus infection.  Smoking.  Alcohol abuse.  Breathing in chemicals or dust.  Growths on the vocal cords. RISK FACTORS Risk factors for laryngitis include:  Smoking.  Alcohol  abuse.  Having allergies. SIGNS AND SYMPTOMS Symptoms of laryngitis may include:  Low, hoarse voice.  Loss of voice.  Dry cough.  Sore throat.  Stuffy nose. DIAGNOSIS Laryngitis may be diagnosed by:  Physical exam.  Throat culture.  Blood test.  Laryngoscopy. This procedure allows your health care provider to look at your vocal cords with a mirror or viewing tube. TREATMENT Treatment for laryngitis depends on what is causing it. Usually, treatment involves resting your voice and using medicines to soothe your throat. However, if your laryngitis is caused by a bacterial infection, you may need to take antibiotic medicine. If your laryngitis is caused by a growth, you may need to have a procedure to remove it. HOME CARE INSTRUCTIONS  Drink enough fluid to keep your urine clear or pale yellow.  Breathe in moist air. Use a humidifier if you live in a dry climate.  Take medicines only as directed by your health care provider.  If you were prescribed an antibiotic medicine, finish it all even if you start to feel better.  Do not smoke cigarettes or electronic cigarettes. If you need help quitting, ask your health care provider.  Talk as little as possible. Also avoid whispering, which can cause vocal strain.  Write instead of talking. Do this until your voice is back to normal. SEEK MEDICAL CARE IF:  You have a fever.  You have increasing pain.  You have difficulty swallowing. SEEK IMMEDIATE MEDICAL CARE IF:  You cough up blood.  You have trouble breathing.   This information is not intended to replace advice given to you by your health care provider. Make sure you discuss any questions you have with your health care provider.   Document Released: 09/05/2005 Document Revised: 09/26/2014 Document Reviewed: 02/18/2014 Elsevier Interactive Patient Education [employer].

## 2016-03-05 NOTE — ED Notes (Signed)
Complaints of cough for one week.  Reports episodes of coughing.  Non-productive cough.  Patient reports some sneezing.  Last night lost her voice

## 2016-04-04 ENCOUNTER — Telehealth: Payer: Self-pay | Admitting: Obstetrics and Gynecology

## 2016-04-04 ENCOUNTER — Encounter: Payer: Self-pay | Admitting: Family Medicine

## 2016-04-04 ENCOUNTER — Ambulatory Visit (INDEPENDENT_AMBULATORY_CARE_PROVIDER_SITE_OTHER): Payer: BLUE CROSS/BLUE SHIELD | Admitting: Family Medicine

## 2016-04-04 VITALS — BP 122/84 | HR 93 | Temp 98.0°F | Ht 63.25 in | Wt 226.3 lb

## 2016-04-04 DIAGNOSIS — N950 Postmenopausal bleeding: Secondary | ICD-10-CM

## 2016-04-04 DIAGNOSIS — Z6839 Body mass index (BMI) 39.0-39.9, adult: Secondary | ICD-10-CM | POA: Diagnosis not present

## 2016-04-04 DIAGNOSIS — E785 Hyperlipidemia, unspecified: Secondary | ICD-10-CM

## 2016-04-04 DIAGNOSIS — R739 Hyperglycemia, unspecified: Secondary | ICD-10-CM

## 2016-04-04 DIAGNOSIS — C8190 Hodgkin lymphoma, unspecified, unspecified site: Secondary | ICD-10-CM

## 2016-04-04 DIAGNOSIS — I10 Essential (primary) hypertension: Secondary | ICD-10-CM | POA: Diagnosis not present

## 2016-04-04 DIAGNOSIS — Z0000 Encounter for general adult medical examination without abnormal findings: Secondary | ICD-10-CM | POA: Diagnosis not present

## 2016-04-04 DIAGNOSIS — R590 Localized enlarged lymph nodes: Secondary | ICD-10-CM

## 2016-04-04 LAB — BASIC METABOLIC PANEL
BUN: 13 mg/dL (ref 6–23)
CHLORIDE: 101 meq/L (ref 96–112)
CO2: 28 meq/L (ref 19–32)
CREATININE: 0.81 mg/dL (ref 0.40–1.20)
Calcium: 9.6 mg/dL (ref 8.4–10.5)
GFR: 95.04 mL/min (ref >60.00)
GLUCOSE: 102 mg/dL — AB (ref 70–99)
Potassium: 3.4 mEq/L — ABNORMAL LOW (ref 3.5–5.1)
Sodium: 136 mEq/L (ref 135–145)

## 2016-04-04 LAB — LIPID PANEL
CHOLESTEROL: 210 mg/dL — AB (ref 0–200)
HDL: 55.8 mg/dL (ref >39.00)
LDL Cholesterol: 139 mg/dL — ABNORMAL HIGH (ref 0–99)
NonHDL: 153.92
TRIGLYCERIDES: 74 mg/dL (ref 0.0–149.0)
Total CHOL/HDL Ratio: 4
VLDL: 14.8 mg/dL (ref 0.0–40.0)

## 2016-04-04 LAB — HEMOGLOBIN A1C: Hgb A1c MFr Bld: 6.1 % (ref 4.6–6.5)

## 2016-04-04 LAB — TSH: TSH: 0.71 u[IU]/mL (ref 0.35–4.50)

## 2016-04-04 NOTE — Progress Notes (Signed)
HPI:  Here for CPE .She has not been in for a visit in almost 1 year.  -Concerns and/or follow up today:Hypertension: -meds: hctz, added lisinopril 12/2014 -denies: CP, SOB, DOE, swelling  Obesity/Prediabtes/HLD: -not exercising and reports diet not great -denies: polyuria, polydipsia, vision changes  Axillary LAD: -non-hodkin lymphoma -she has axillay lymphadenopathy  -seeing onc and [facility] for this - on review of chart, appears she did not follow up as advised -denies: CP, SOB, fever, malaise, unintentional weight loss  New problem of vaginal bleeding: -reports no periods or bleeding since 2000, then intermittent bleeding for 5 months -random spotting from 1-3 days, needs to wear a pad at times -denies: fevers, malaise, chills, abd/pelvic pain  -Taking folic acid, vitamin D or calcium: no  -Vaccines: UTD  -pap history: utd per chart, she has opted to see gyn  -sexual activity: yes, female partner, no new partners  -wants STI testing (Hep C if born [birth year]): no  -FH breast, colon or ovarian ca: see FH Last mammogram: plans to see gyn Last colon cancer screening: utd  Breast Ca Risk Assessment: -plans to see gyn for women's health, sees oncologist as weel  -Alcohol, Tobacco, drug use: see social history  Review of Systems - no fevers, unintentional weight loss, vision loss, hearing loss, chest pain, sob, hemoptysis, melena, hematochezia, hematuria, genital discharge, changing or concerning skin lesions, bruising, loc, thoughts of self harm or SI  Past Medical History  Diagnosis Date  . Hyperlipemia   . Hodgkin disease ([place]) 1999    lymphocytic predominant Hodkin's 2000, s/p 4 weeks Rituxan, chlorambucil 10/21/99-04/19/00  . History of migraine   . Hypertension   . Allergy   . Depression   . Hx of iron deficiency anemia     per [facility] notes, since childhood, possible thalassemia  . Hyperglycemia   . Obesity   . LAD (lymphadenopathy), axillary     eval with onc and gsu  2016    Past Surgical History  Procedure Laterality Date  . Laparascop    . Laparoscopic removal abdominal mass  2000    LLQ  . Breast surgery Left     due to bloody discharge    Family History  Problem Relation Age of Onset  . Hypertension Mother   . Stroke Mother   . Hypertension Father   . Diabetes Father   . Hypertension Sister   . Hypertension Brother   . Hypertension Daughter     Social History   Social History  . Marital Status: Divorced    Spouse Name: N/A  . Number of Children: N/A  . Years of Education: N/A   Social History Main Topics  . Smoking status: Never Smoker   . Smokeless tobacco: Never Used  . Alcohol Use: No  . Drug Use: No  . Sexual Activity: Not Asked   Other Topics Concern  . 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  Calcium Carb-Cholecalciferol (CALCIUM + D3 PO), Take by mouth daily., Disp: , Rfl:  .  Cholecalciferol (VITAMIN D3) 2000 UNITS TABS, Take by mouth daily., Disp: , Rfl:  .  hydrochlorothiazide (HYDRODIURIL) 25 MG tablet,  TAKE 1 TABLET (25 MG TOTAL) BY MOUTH DAILY., Disp: 90 tablet, Rfl: 0 .  lisinopril (PRINIVIL,ZESTRIL) 5 MG tablet, TAKE 1 TABLET (5 MG TOTAL) BY MOUTH DAILY., Disp: 90 tablet, Rfl: 0  EXAM:  Filed Vitals:   04/04/16 1133  BP: 122/84  Pulse: 93  Temp: 98 F (36.7 C)   Body mass index is 39.75 kg/(m^2).  GENERAL: vitals reviewed and listed below, alert, oriented, appears well hydrated and in no acute distress  HEENT: head atraumatic, PERRLA, normal appearance of eyes, ears, nose and mouth. moist mucus membranes.  NECK: supple, no masses or lymphadenopathy  LUNGS: clear to auscultation bilaterally, no rales, rhonchi or wheeze  CV: HRRR, no peripheral  edema or cyanosis, normal pedal pulses  BREAST: deferred, plans to see gyn  ABDOMEN: bowel sounds normal, soft, non tender to palpation, no masses, no rebound or guarding  GU: deferred, plans to see gyn  SKIN: no rash or abnormal lesions  MS: normal gait, moves all extremities normally  NEURO: normal gait, speech and thought processing grossly intact, muscle tone grossly intact throughout  PSYCH: normal affect, pleasant and cooperative  ASSESSMENT AND PLAN:  Discussed the following assessment and plan:  Visit for preventive health examination -Discussed and advised all [place] preventive services health task force level A and B recommendations for age, sex and risks. -Advised at least 150 minutes of exercise per week and a healthy diet with avoidance of (less then 1 serving per week) processed foods, white starches, red meat, fast foods and sweets and consisting of: * 5-9 servings of fresh fruits and vegetables (not corn or potatoes) *nuts and seeds, beans *olives and olive oil *lean meats such as fish and white chicken  *whole grains -she needs to see gyn for her postmenopausal bleeding and plans to do women's health with gyn  -FASTING labs, studies and vaccines per orders this encounter BMI 39.0-39.9,adult  Essential hypertension - Plan: CBC with Differential/Platelets, Basic metabolic panel -cont current medications -lifestyle recs  Hyperlipemia - Plan: Lipid panel -lifestyle recs  Hyperglycemia - Plan: Hemoglobin A1C -lifestyle recs  Hodgkin lymphoma, unspecified Hodgkin lymphoma type, unspecified body region ([place]) LAD (lymphadenopathy), axillary -advised she must follow up with her oncologist; she agrees and reports she just had a lot going on and that is why she missed follow up -she agrees to call her oncologist today, number provided  Postmenopausal bleeding - Plan: Ambulatory referral to Gynecology, TSH -we discussed possible serious and likely etiologies, workup  and treatment, treatment risks and return precautions -urgent referral to gyn -she prefers to do women's health with new gyn as well as does not realy have a regular gynecologist in the area    Orders Placed This Encounter  Procedures  . CBC with Differential/Platelets  . Lipid panel  . Basic metabolic panel  . Hemoglobin A1C  . TSH  . Ambulatory referral to Gynecology    Referral Priority:  Urgent    Referral Type:  Consultation    Referral Reason:  Specialty Services Required    Requested Specialty:  Gynecology    Number of Visits Requested:  1    Patient advised to return to clinic immediately if symptoms worsen or persist or new concerns.  Patient Instructions  BEFORE YOU LEAVE: -follow up: in 3 months -labs -number for oncologist  CALL today to schedule follow up with your oncologist.  -We placed a referral for you as discussed to the gynecologist for the bleeding and your gyn care. It usually  takes about 1-2 weeks to process and schedule this referral. If you have not heard from [place] regarding this appointment in 2 weeks please contact our office.  We recommend the following healthy lifestyle: 1) Small portions - eat off of salad plate instead of dinner plate 2) Eat a healthy clean diet with avoidance of (less then 1 serving per week) processed foods, sweetened drinks, white starches, red meat, fast foods and sweets and consisting of: * 5-9 servings per day of fresh or frozen fruits and vegetables (not corn or potatoes, not dried or canned) *nuts and seeds, beans *olives and olive oil *small portions of lean meats such as fish and white chicken  *small portions of whole grains 3)Get at least 150 minutes of sweaty aerobic exercise per week 4)reduce stress - counseling, meditation, relaxation to balance other aspects of your life  We have ordered labs or studies at this visit. It can take up to 1-2 weeks for results and processing. IF results require follow up or  explanation, we will call you with instructions. Clinically stable results will be released to your [facility]. If you have not heard from [place] or cannot find your results in [facility] in 2 weeks please contact our office at [phone].  If you are not yet signed up for [facility], please consider signing up.             No Follow-up on file.  Colin Benton R., DO

## 2016-04-04 NOTE — Progress Notes (Signed)
Pre visit review using our clinic review tool, if applicable. No additional management support is needed unless otherwise documented below in the visit note. 

## 2016-04-04 NOTE — Telephone Encounter (Signed)
Called and left a message for patient to call back to schedule a new patient doctor referral. °

## 2016-04-04 NOTE — Patient Instructions (Signed)
BEFORE YOU LEAVE: -follow up: in 3 months -labs -number for oncologist  CALL today to schedule follow up with your oncologist.  -We placed a referral for you as discussed to the gynecologist for the bleeding and your gyn care. It usually takes about 1-2 weeks to process and schedule this referral. If you have not heard from [place] regarding this appointment in 2 weeks please contact our office.  We recommend the following healthy lifestyle: 1) Small portions - eat off of salad plate instead of dinner plate 2) Eat a healthy clean diet with avoidance of (less then 1 serving per week) processed foods, sweetened drinks, white starches, red meat, fast foods and sweets and consisting of: * 5-9 servings per day of fresh or frozen fruits and vegetables (not corn or potatoes, not dried or canned) *nuts and seeds, beans *olives and olive oil *small portions of lean meats such as fish and white chicken  *small portions of whole grains 3)Get at least 150 minutes of sweaty aerobic exercise per week 4)reduce stress - counseling, meditation, relaxation to balance other aspects of your life  We have ordered labs or studies at this visit. It can take up to 1-2 weeks for results and processing. IF results require follow up or explanation, we will call you with instructions. Clinically stable results will be released to your [facility]. If you have not heard from [place] or cannot find your results in [facility] in 2 weeks please contact our office at [phone].  If you are not yet signed up for [facility], please consider signing up.

## 2016-04-05 LAB — CBC WITH DIFFERENTIAL/PLATELET
BASOS ABS: 0 10*3/uL (ref 0.0–0.1)
Basophils Relative: 0.3 % (ref 0.0–3.0)
EOS ABS: 0.1 10*3/uL (ref 0.0–0.7)
Eosinophils Relative: 0.8 % (ref 0.0–5.0)
HEMATOCRIT: 37.5 % (ref 36.0–46.0)
Hemoglobin: 12.4 g/dL (ref 12.0–15.0)
LYMPHS ABS: 3 10*3/uL (ref 0.7–4.0)
LYMPHS PCT: 30.2 % (ref 12.0–46.0)
MCHC: 33.1 g/dL (ref 30.0–36.0)
MCV: 84.8 fl (ref 78.0–100.0)
Monocytes Absolute: 0.6 10*3/uL (ref 0.1–1.0)
Monocytes Relative: 5.7 % (ref 3.0–12.0)
NEUTROS ABS: 6.3 10*3/uL (ref 1.4–7.7)
NEUTROS PCT: 63 % (ref 43.0–77.0)
PLATELETS: 463 10*3/uL — AB (ref 150.0–400.0)
RBC: 4.42 Mil/uL (ref 3.87–5.11)
RDW: 15.1 % (ref 11.5–15.5)
WBC: 10 10*3/uL (ref 4.0–10.5)

## 2016-04-06 ENCOUNTER — Ambulatory Visit (INDEPENDENT_AMBULATORY_CARE_PROVIDER_SITE_OTHER): Payer: BLUE CROSS/BLUE SHIELD | Admitting: Obstetrics and Gynecology

## 2016-04-06 ENCOUNTER — Encounter: Payer: Self-pay | Admitting: Obstetrics and Gynecology

## 2016-04-06 VITALS — BP 112/80 | HR 80 | Resp 15 | Ht 63.5 in | Wt 227.0 lb

## 2016-04-06 DIAGNOSIS — Z124 Encounter for screening for malignant neoplasm of cervix: Secondary | ICD-10-CM

## 2016-04-06 DIAGNOSIS — N950 Postmenopausal bleeding: Secondary | ICD-10-CM

## 2016-04-06 NOTE — Patient Instructions (Signed)

## 2016-04-06 NOTE — Progress Notes (Signed)
55 y.o. [id number] DivorcedAfrican AmericanF here for a consultation from Dr Colin Benton for post menopause bleeding. The bleeding started in March and she has had bleeding or spotting once or twice a month since. The patient was treated for non-hodgkins lymphoma in 2000, stopped having menses after treated. No vaginal bleeding until March. In March she had 3 days of light bleeding. Since then she has had 3 other episodes of bleeding lightly x 3 days, not on a schedule, some spotting in between the bleeding. No pain.  She is sexually active, no pain, never had issues with dryness. She denies a h/o of hot flashes or night sweats.     Patient's last menstrual period was 10/19/1998 (approximate).          Sexually active: Yes.    The current method of family planning is post menopausal status.    Exercising: No.  The patient does not participate in regular exercise at present. Smoker:  no  Health Maintenance: Pap:  09-05-14 WNL NEG HR HPV History of abnormal Pap:  no MMG:  08-19-14 WNL Colonoscopy:  11-28-14 polyps BMD:   Never TDaP:  06-26-14 Gardasil: N/A   reports that she has never smoked. She has never used smokeless tobacco. She reports that she does not drink alcohol or use illicit drugs. She works in Scient[employer], research (medical). 2 kids are 29 and 19, she has 2 grandchildren (82 and 7).   Past Medical History  Diagnosis Date  . Hyperlipemia   . Hodgkin disease ([place]) 1999    lymphocytic predominant Hodkin's 2000, s/p 4 weeks Rituxan, chlorambucil 10/21/99-04/19/00  . History of migraine   . Hypertension   . Allergy   . Depression   . Hx of iron deficiency anemia     per [facility] notes, since childhood, possible thalassemia  . Hyperglycemia   . Obesity   . LAD (lymphadenopathy), axillary     eval [facility] onc and [facility]  . Fibroid   . Anxiety   . STD (sexually transmitted disease)     gonorrhea    Past Surgical History  Procedure Laterality Date  . Laparascop    . Laparoscopic removal abdominal mass  2000     LLQ  . Breast surgery Left     due to bloody discharge    Current Outpatient Prescriptions  Medication Sig Dispense Refill  . Calcium Carb-Cholecalciferol (CALCIUM + D3 PO) Take by mouth daily.    . Cholecalciferol (VITAMIN D3) 2000 UNITS TABS Take by mouth daily.    . hydrochlorothiazide (HYDRODIURIL) 25 MG tablet TAKE 1 TABLET (25 MG TOTAL) BY MOUTH DAILY. 90 tablet 0  . lisinopril (PRINIVIL,ZESTRIL) 5 MG tablet TAKE 1 TABLET (5 MG TOTAL) BY MOUTH DAILY. 90 tablet 0   No current facility-administered medications for this visit.    Family History  Problem Relation Age of Onset  . Hypertension Mother   . Stroke Mother   . Hypertension Father   . Diabetes Father   . Hypertension Sister   . Hypertension Brother   . Hypertension Daughter     Review of Systems  Constitutional: Negative.   HENT: Negative.   Eyes: Negative.   Respiratory: Negative.   Cardiovascular: Negative.   Gastrointestinal: Negative.   Endocrine: Negative.   Genitourinary: Negative.        Irregular vaginal bleeding   Musculoskeletal: Negative.   Skin: Negative.   Allergic/Immunologic: Negative.   Neurological: Negative.   Psychiatric/Behavioral: Negative.     Exam:   BP 112/80  mmHg  Pulse 80  Resp 15  Ht 5' 3.5" (1.613 m)  Wt 227 lb (102.967 kg)  BMI 39.58 kg/m2  LMP 10/19/1998 (Approximate)  Weight change: @WEIGHTCHANGE @ Height:   Height: 5' 3.5" (161.3 cm)  Ht Readings from Last 3 Encounters:  04/06/16 5' 3.5" (1.613 m)  04/04/16 5' 3.25" (1.607 m)  06/19/15 5\' 3"  (1.6 m)    General appearance: alert, cooperative and appears stated age Head: Normocephalic, without obvious abnormality, atraumatic Neck: no adenopathy, supple, symmetrical, trachea midline and thyroid normal to inspection and palpation Lungs: clear to auscultation bilaterally Breasts: normal appearance, no masses or tenderness Heart: regular rate and rhythm Abdomen: soft, non-tender; bowel sounds normal; no masses,  no  organomegaly Extremities: extremities normal, atraumatic, no cyanosis or edema Skin: Skin color, texture, turgor normal. No rashes or lesions Lymph nodes: Cervical, supraclavicular, and axillary nodes normal. No abnormal inguinal nodes palpated Neurologic: Grossly normal   Pelvic: External genitalia:  no lesions              Urethra:  normal appearing urethra with no masses, tenderness or lesions              Bartholins and Skenes: normal                 Vagina: normal appearing vagina with mild atrophy, no discharge, no lesions              Cervix: no lesions               Bimanual Exam:  Uterus:  retroverted, top normal sized, mobile, not tender              Adnexa: no mass, fullness, tenderness               Rectovaginal: Confirms               Anus:  normal sphincter tone, no lesions  The risks of endometrial biopsy were reviewed and a consent was obtained.  A speculum was placed in the vagina and the cervix was cleansed with betadine. The mini-pipelle was placed into the endometrial cavity. The uterus sounded to 9 cm. The endometrial biopsy was performed, moderate tissue was obtained. The tenaculum and speculum were removed. There were no complications.    Chaperone was present for exam.  A:  Postmenopausal bleeding  H/O premature menopause s/p chemo for non-hodgkins lymphoma in 2000  P:   Pap done  Endometrial biopsy done  Will check FSH and estradiol (occasionally women will have menses restart years after chemo, I doubt that is her situation, but will confirm)  After biopsy results return will set up an ultrasound, possible sonohysterogram (reviewed with the patient)   CC: Dr Colin Benton Letter sent

## 2016-04-07 ENCOUNTER — Telehealth: Payer: Self-pay | Admitting: Family Medicine

## 2016-04-07 LAB — FOLLICLE STIMULATING HORMONE: FSH: 41.2 m[IU]/mL

## 2016-04-07 LAB — ESTRADIOL: ESTRADIOL: 20 pg/mL

## 2016-04-07 NOTE — Telephone Encounter (Signed)
See results note. 

## 2016-04-07 NOTE — Telephone Encounter (Signed)
Pt returned your call after getting disconnected

## 2016-04-08 LAB — IPS PAP TEST WITH HPV

## 2016-04-12 ENCOUNTER — Telehealth: Payer: Self-pay

## 2016-04-12 ENCOUNTER — Telehealth: Payer: Self-pay | Admitting: Family Medicine

## 2016-04-12 DIAGNOSIS — N950 Postmenopausal bleeding: Secondary | ICD-10-CM

## 2016-04-12 NOTE — Telephone Encounter (Signed)
Order placed for PUS and possible SHGM.  Notes Recorded by Nicholes Rough, CMA on 04/11/2016 at 1:39 PM EDT Please put in orders (see message below)  Thanks ------  Notes Recorded by Nicholes Rough, CMA on 04/11/2016 at 9:04 AM EDT Patient is aware of other lab results. She is in 02 recall. Will forward message to [place] for U/S/ sonohysterogram Referral-eh ------  Notes Recorded by Salvadore Dom, MD on 04/10/2016 at 5:03 PM EDT 02 Please make sure she got the message about her other labs and is being set up for an ultrasound possible sonohysterogram. I can't tell if someone called her. ------  Notes Recorded by Salvadore Dom, MD on 04/07/2016 at 6:56 PM Please inform the patient that her endometrial biopsy was benign and set her up for a TV ultrasound, possible sonohysterogram (depending on the U/S). She is expecting an ultrasound. Thanks!! ------  Notes Recorded by Nicholes Rough, CMA on 04/07/2016 at 9:15 AM Spoke with patient and informed her of her lab results. -eh ------  Notes Recorded by Salvadore Dom, MD on 04/07/2016 at 8:53 AM Please inform the patient that her blood work is consistent with menopause.   Cc: Lara Mulch  Routing to provider for final review. Patient agreeable to disposition. Will close encounter.

## 2016-04-12 NOTE — Telephone Encounter (Signed)
I left message for the pt to return my call.

## 2016-04-12 NOTE — Telephone Encounter (Signed)
Pt would like a call back concerning the day she was seen at [facility] to leave on voice mail

## 2016-04-13 ENCOUNTER — Telehealth: Payer: Self-pay | Admitting: Obstetrics and Gynecology

## 2016-04-13 ENCOUNTER — Ambulatory Visit: Payer: Self-pay | Admitting: General Surgery

## 2016-04-13 ENCOUNTER — Other Ambulatory Visit: Payer: Self-pay | Admitting: General Surgery

## 2016-04-13 DIAGNOSIS — R590 Localized enlarged lymph nodes: Secondary | ICD-10-CM | POA: Diagnosis not present

## 2016-04-13 NOTE — Telephone Encounter (Signed)
Patient calling for pap results

## 2016-04-13 NOTE — H&P (Signed)
Whitney Santiago 04/13/2016 10:55 AM Location: [facility] Patient #: [id number] DOB: 1961-03-11 Divorced / Language: Whitney Santiago / Race: Black or African American Female  History of Present Illness Whitney Hollingshead MD; 04/13/2016 11:33 AM) Patient words: discuss surgery.  The patient is a 55 year old female.   Note:She is well known to me. She presented in April 2016 for a right axillary lymph node biopsy. She has a history of Hodgkin's lymphoma and had some hypermetabolic adenopathy in both of the axilla areas. She had a palpable lymph node in the right axilla. We had scheduled her for the biopsy but she did not have it done has been lost to follow-up until now. She states she had a major life change in the interim.  She tried to call and get an appointment with her oncologist, Dr.Dr. Osker Santiago, and was told that she needed to come here and get the biopsy first before he could see her as he did not have enough information without the biopsy. She does presents for that. No night sweats. No weight loss. She feels the enlarged lymph node is still present.  Problem List/Past Medical Whitney Santiago, [place]; 04/13/2016 10:56 AM) Whitney Santiago, AXILLARY (R59.0)  Other Problems Whitney Santiago, CMA; 04/13/2016 10:56 AM) High blood pressure  Past Surgical History Whitney Santiago, [place]; 04/13/2016 10:56 AM) Breast Biopsy Left. Colon Polyp Removal - Colonoscopy  Diagnostic Studies History Whitney Santiago, [place]; 04/13/2016 10:56 AM) Colonoscopy >10 years ago Mammogram within last year  Allergies Whitney Santiago, CMA; 04/13/2016 10:56 AM) HYDROcodone Bitartrate *CHEMICALS* Hives. OxyCODONE HCl (Abuse Deter) *ANALGESICS - OPIOID* Hives.  Medication History Whitney Santiago, [place]; 04/13/2016 10:57 AM) Lisinopril (5MG  Tablet, Oral) Active. HydroCHLOROthiazide (25MG  Tablet, Oral) Active. Calcium-Vitamin D (900MG  Capsule, 1 Oral daily) Active. (Pt takes 2000 U of  Vitamin D) Medications Reconciled  Social History Whitney Santiago, [place]; 04/13/2016 10:57 AM) Alcohol use Occasional alcohol use. Caffeine use Coffee, Tea. No drug use Tobacco use Never smoker.  Family History Whitney Santiago, [place]; 04/13/2016 10:57 AM) Cerebrovascular Accident Mother. Diabetes Mellitus Father. Hypertension Brother, Daughter, Mother, Sister.  Pregnancy / Birth History Whitney Santiago, [place]; 04/13/2016 10:57 AM) Age at menarche 68 years. Age of menopause <47 Gravida 2 Maternal age 63-20 Para 2     Review of Systems Whitney Hollingshead MD; 04/13/2016 11:33 AM)  Note: No night sweats. No dyspnea. No weight loss.   Vitals Whitney Santiago CMA; 04/13/2016 10:56 AM) 04/13/2016 10:55 AM Weight: 227 lb Height: 63in Body Surface Area: 2.04 m Body Mass Index: 40.21 kg/m  BP: 122/84 (Sitting, Left Arm, Standard)      Physical Exam Whitney Hollingshead MD; 04/13/2016 11:34 AM)  The physical exam findings are as follows: Note:General: Morbidly obese female in NAD. Pleasant and cooperative.  CV: RRR, no murmur, no edema  CHEST: Breath sounds equal and clear. Respirations nonlabored.  BREASTS: Symmetrical in size. No dominant masses, nipple discharge or suspicious skin lesions.  LYMPHATIC: Palpable right axillary adenopathy.   NEUROLOGIC: Alert and oriented, answers questions appropriately, normal gait and station.  PSYCHIATRIC: Normal mood, affect , and behavior.    Assessment & Plan Whitney Hollingshead MD; 04/13/2016 11:37 AM)  Whitney Santiago, AXILLARY (R59.0) Impression: She presents now to schedule her right axillary lymph node biopsy. She has a history of Hodgkin's lymphoma.  Right axillary lymph node biopsy as an outpatient. The procedure risks and after care were discussed with her. Risks include but are not limited  to bleeding, wound infection, wound healing problem's, nerve damage and weakness or numbness, anesthesia.  She seems to understand all this and would like to proceed.  Whitney Confer, MD

## 2016-04-13 NOTE — Telephone Encounter (Signed)
Spoke with patient. Patient states that she is unable to get to her mailbox at this time and is calling about her recent pap results. Advised patient that her pap on 04/06/2016 was normal with negative HPV. Please see results note from 04/06/2016. She is agreeable and verbalizes understanding.  Routing to provider for final review. Patient agreeable to disposition. Will close encounter.

## 2016-04-13 NOTE — Telephone Encounter (Signed)
Becky, I think the ultrasound order, possible sonohysterogram is now in x 2. Sharee Pimple

## 2016-04-13 NOTE — Addendum Note (Signed)
Addended by: Dorothy Spark on: 04/13/2016 09:35 AM   Modules accepted: Orders

## 2016-04-14 NOTE — Telephone Encounter (Signed)
I called the pt and she stated she needed the first date she was seen at [facility] and thought we had records here but she called [facility] and found the date.  She also wanted to let Dr [name] know she was seen by her surgeon and seen by the gynecologist and the pap and biopsy were negative and she is awaiting an ultrasound.

## 2016-04-19 IMAGING — CR DG HIP COMPLETE 2+V*R*
3 series · 3 of 3 positions shown · non-contrast
Comparison: No priors.

CLINICAL DATA: History of trauma from a fall 4 days ago complaining
of right hip pain and numbness.

EXAM:
RIGHT HIP - COMPLETE 2+ VIEW

[t pelvis a.p.]
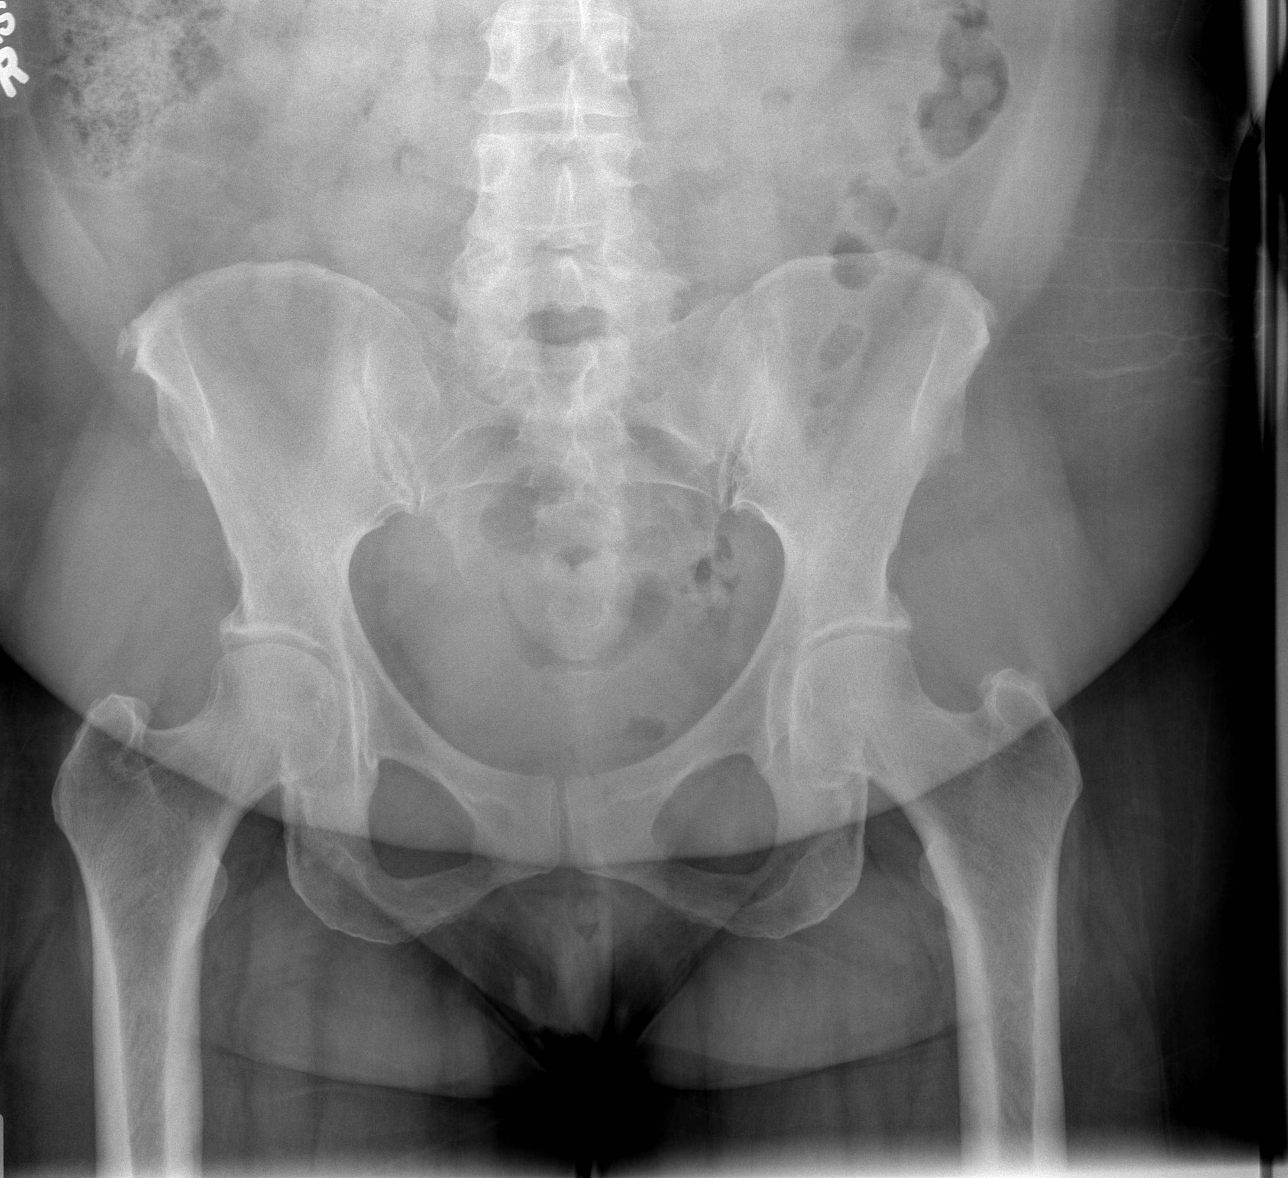

[t hip ap right]
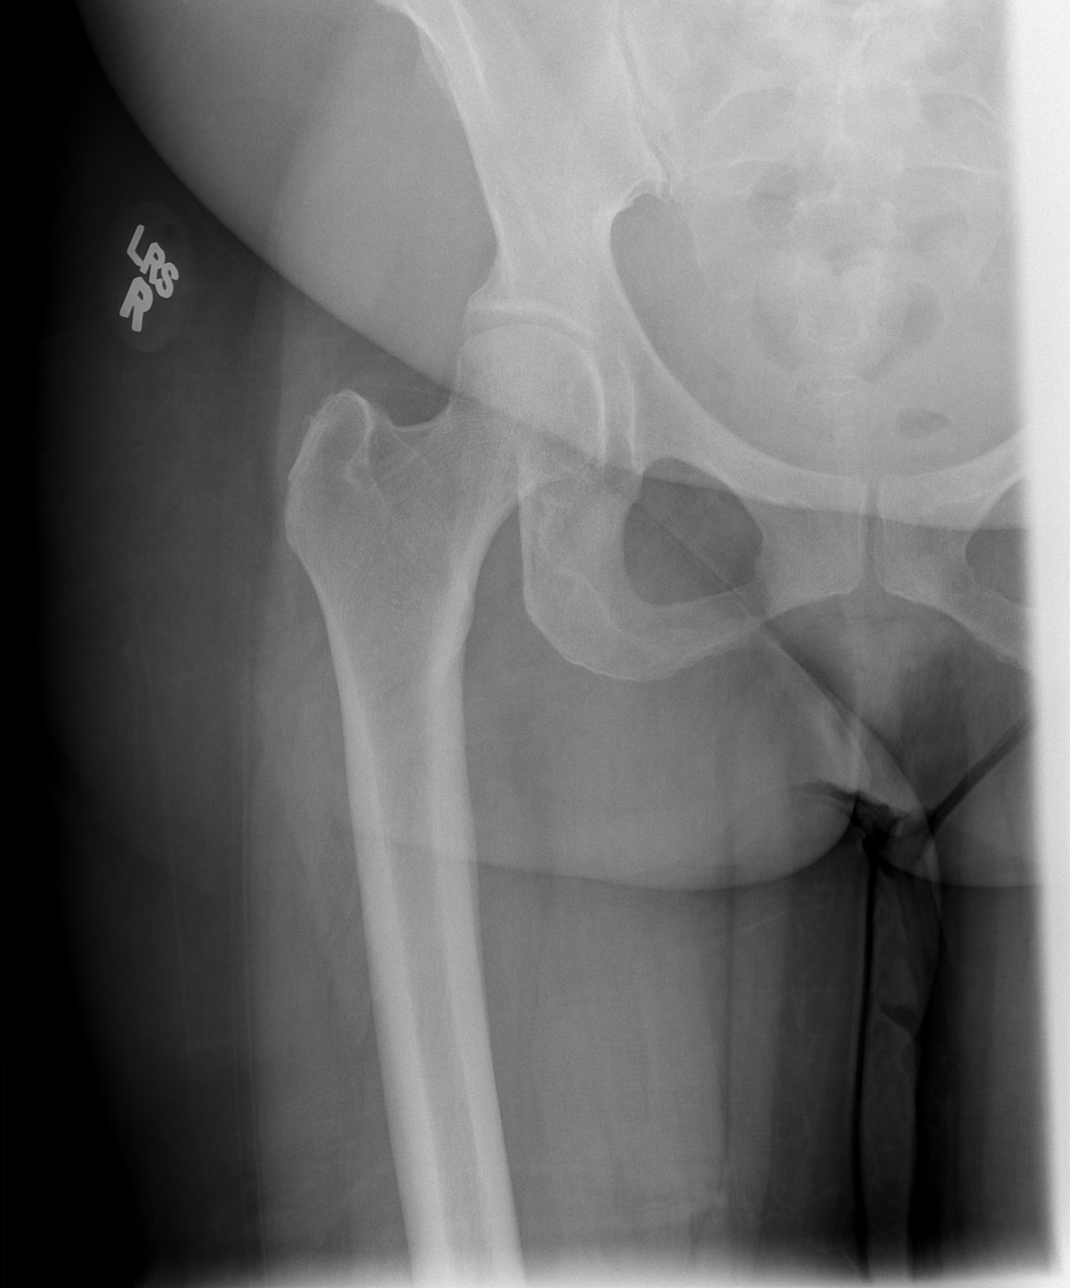

[t hip frog leg right]
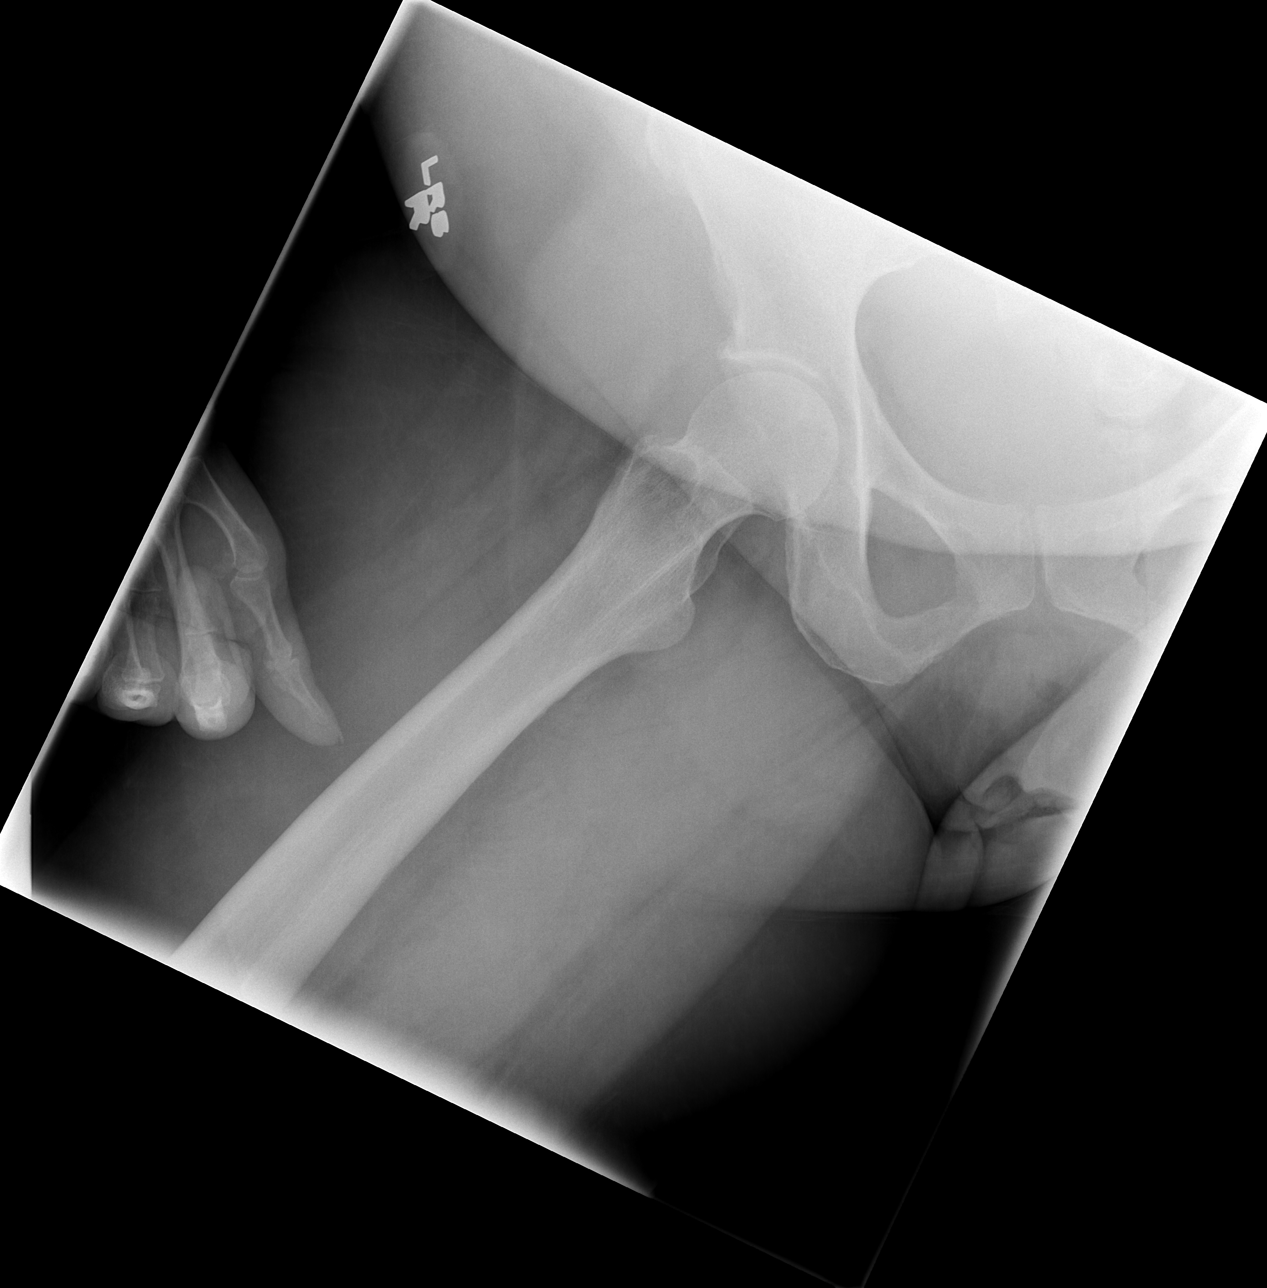

[3 of 3 positions shown; findings below may reference images not displayed]

FINDINGS: There is no evidence of hip fracture or dislocation. There is no
evidence of arthropathy or other focal bone abnormality.
IMPRESSION: Negative.

## 2016-04-19 IMAGING — CR DG SHOULDER 2+V*L*
3 series · 3 of 3 positions shown · non-contrast
Comparison: No priors.

CLINICAL DATA: History of fall 4 days ago complaining of left
shoulder pain.

EXAM:
LEFT SHOULDER - 2+ VIEW

[w shoulder ap internal left]
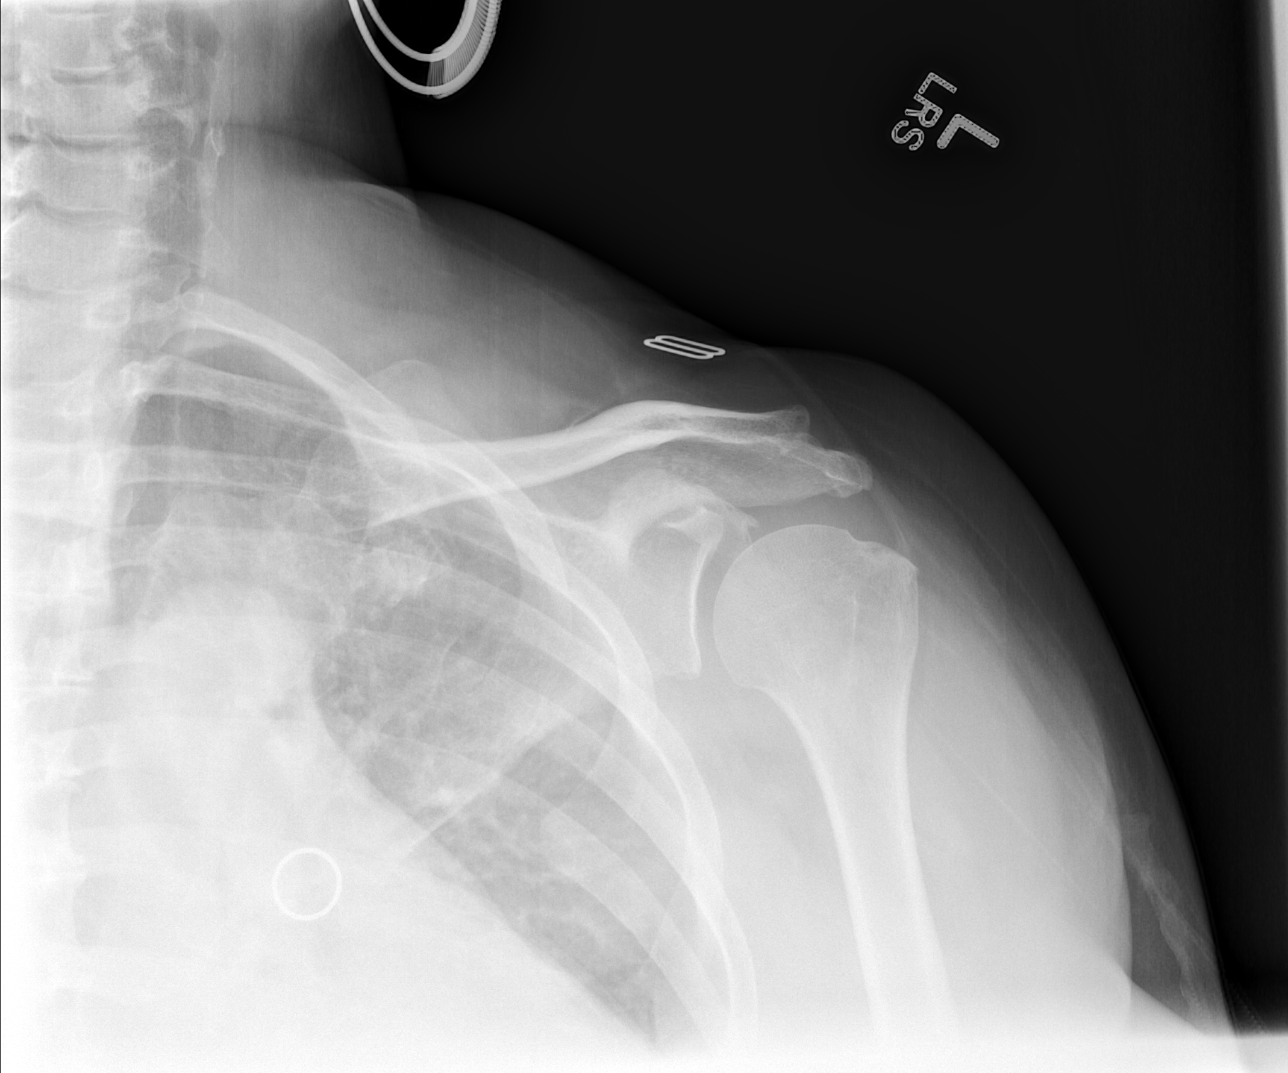

[w shoulder ap external left *]
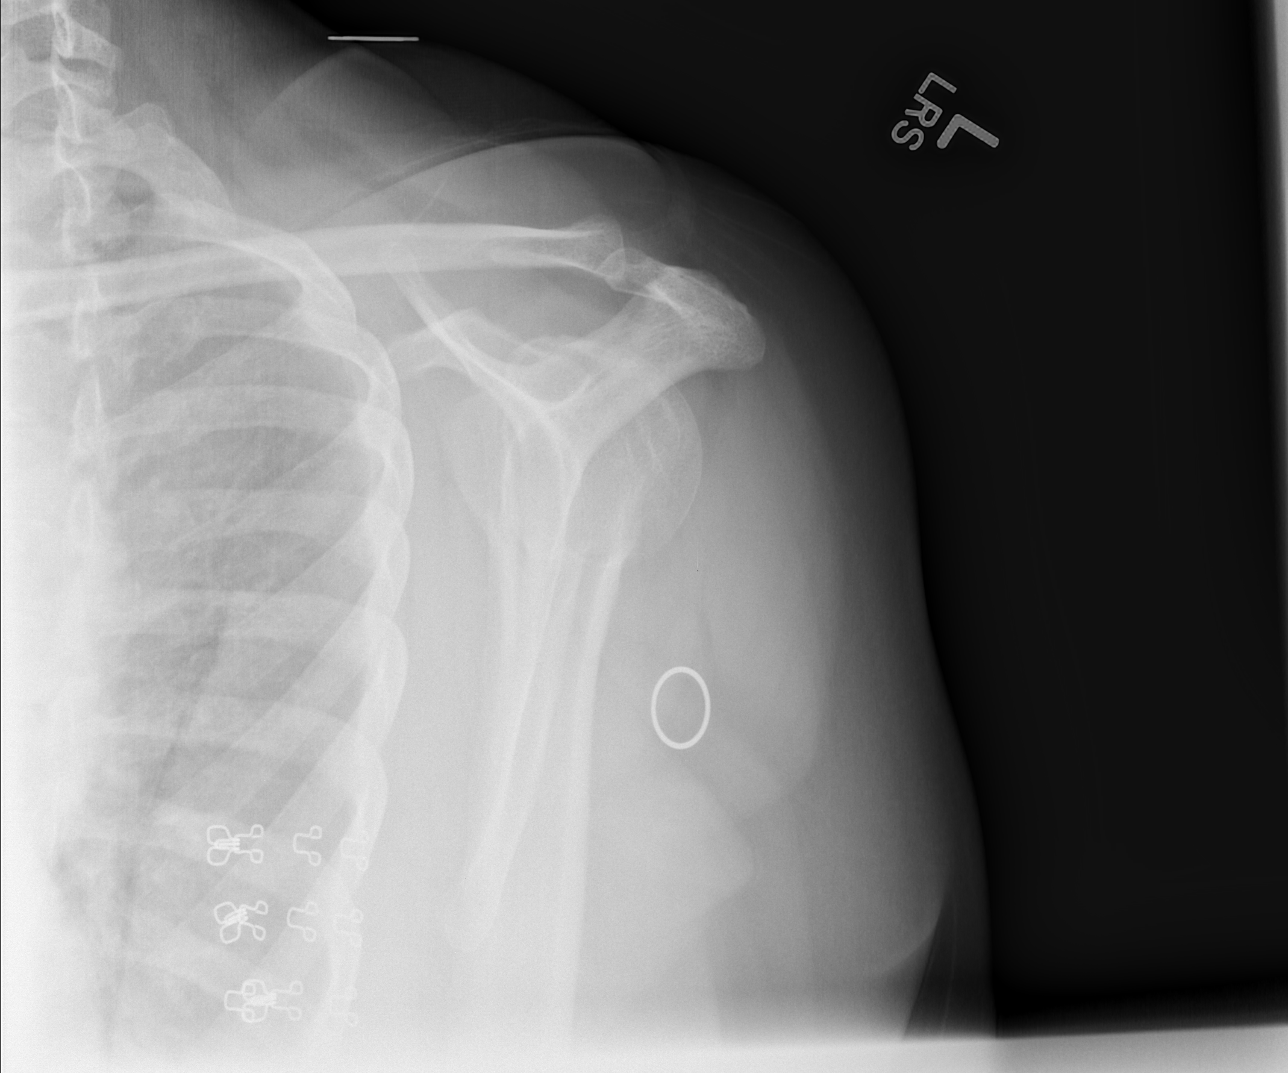

[x shoulder axillary left]
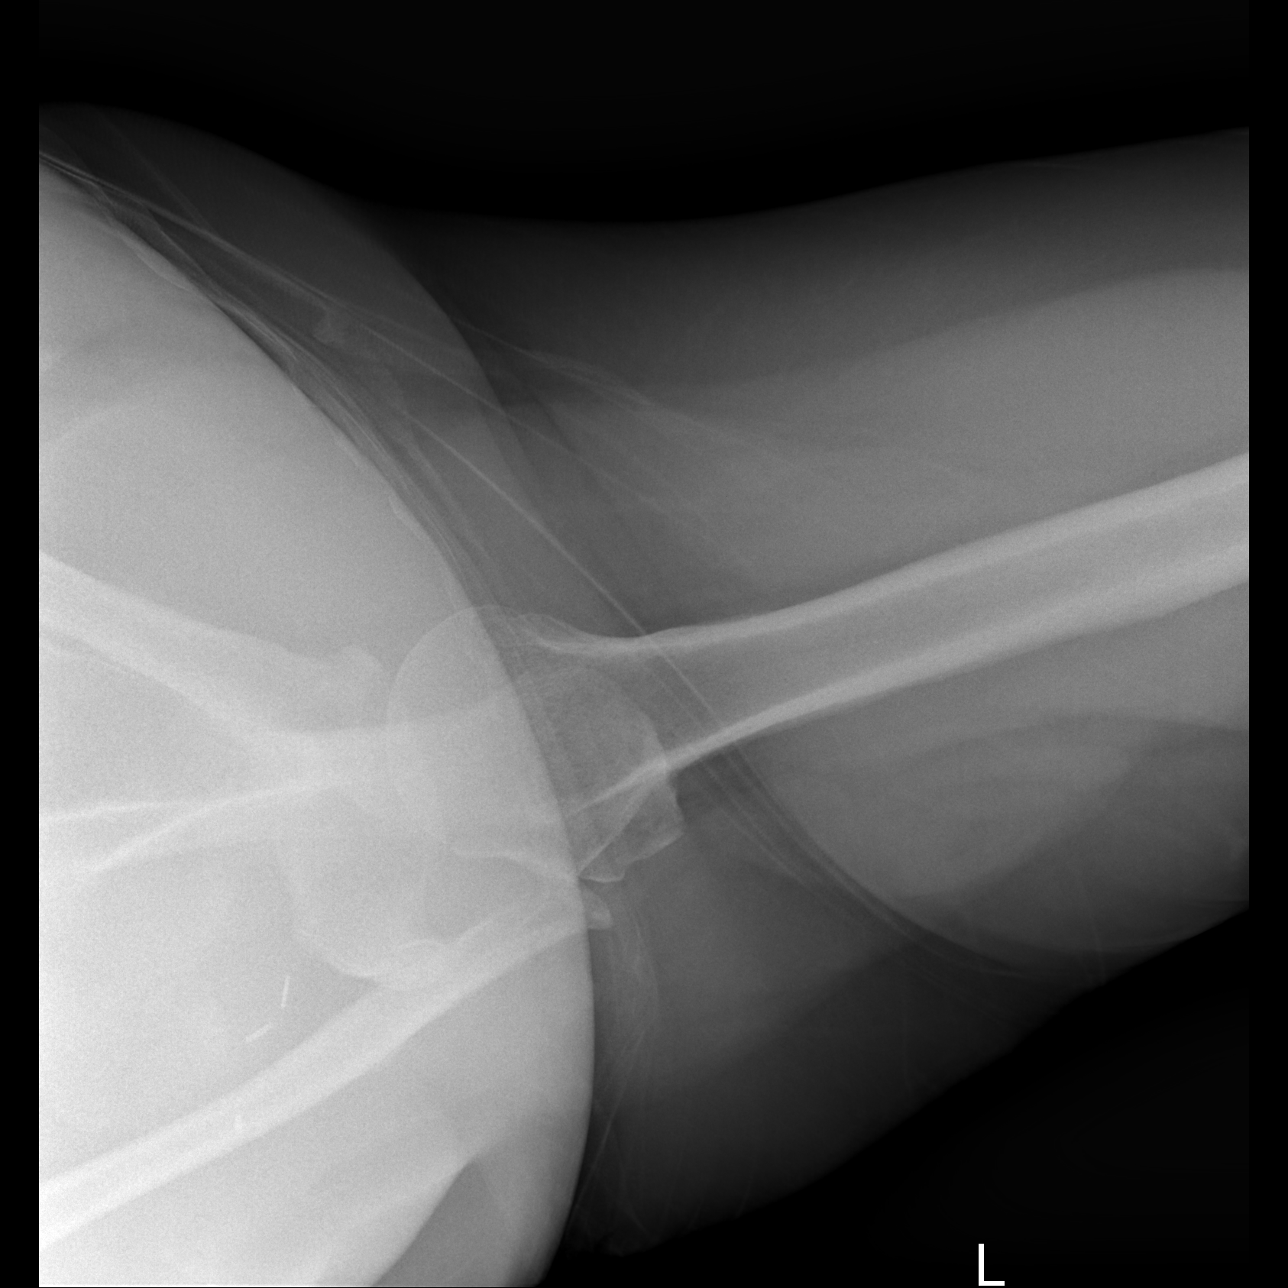

[3 of 3 positions shown; findings below may reference images not displayed]

FINDINGS: Multiple views of the left shoulder demonstrate no acute displaced
fracture, subluxation, dislocation, or soft tissue abnormality.
IMPRESSION: No acute radiographic abnormality of the left shoulder.

## 2016-05-03 ENCOUNTER — Ambulatory Visit (INDEPENDENT_AMBULATORY_CARE_PROVIDER_SITE_OTHER): Payer: BLUE CROSS/BLUE SHIELD

## 2016-05-03 ENCOUNTER — Ambulatory Visit (INDEPENDENT_AMBULATORY_CARE_PROVIDER_SITE_OTHER): Payer: BLUE CROSS/BLUE SHIELD | Admitting: Obstetrics and Gynecology

## 2016-05-03 ENCOUNTER — Other Ambulatory Visit: Payer: Self-pay | Admitting: Obstetrics and Gynecology

## 2016-05-03 ENCOUNTER — Encounter: Payer: Self-pay | Admitting: Obstetrics and Gynecology

## 2016-05-03 VITALS — BP 124/80 | HR 84 | Wt 225.0 lb

## 2016-05-03 DIAGNOSIS — N840 Polyp of corpus uteri: Secondary | ICD-10-CM | POA: Diagnosis not present

## 2016-05-03 DIAGNOSIS — N950 Postmenopausal bleeding: Secondary | ICD-10-CM

## 2016-05-03 NOTE — Progress Notes (Signed)
GYNECOLOGY  VISIT   HPI: 55 y.o.   Divorced  [place] American  female   [phone] with Patient's last menstrual period was 10/19/1998 (approximate).   here for sonohysterogram for postmenopause bleeding. Endometrial biopsy inactive endometrium. PMP FSH and estradiol. She stopped her cycles in 2000 after chemotherapy for Hodgkin's Lymphoma.       GYNECOLOGIC HISTORY: Patient's last menstrual period was 10/19/1998 (approximate). Contraception:none Menopausal hormone therapy: none         OB History    Gravida Para Term Preterm AB Living   2 2 2     2    SAB TAB Ectopic Multiple Live Births           2         Patient Active Problem List   Diagnosis Date Noted  . MDD (major depressive disorder) ([place]) 06/19/2015  . Hyperglycemia 06/19/2015  . LAD (lymphadenopathy), axillary 06/19/2015  . Essential hypertension 01/09/2015  . BMI 39.0-39.9,adult 01/09/2015  . Hyperlipemia 01/09/2015  . Left shoulder pain - managed by [facility] 06/26/2014  . Hodgkin disease, Hx of 2000, treated at [facility] 06/26/2014    Past Medical History:  Diagnosis Date  . Allergy   . Anxiety   . Depression   . Fibroid   . History of migraine   . Hodgkin disease ([place]) 1999   lymphocytic predominant Hodkin's 2000, s/p 4 weeks Rituxan, chlorambucil 10/21/99-04/19/00  . Hx of iron deficiency anemia    per [facility] notes, since childhood, possible thalassemia  . Hyperglycemia   . Hyperlipemia   . Hypertension   . LAD (lymphadenopathy), axillary    eval [facility] onc [facility]  . Obesity   . STD (sexually transmitted disease)    gonorrhea    Past Surgical History:  Procedure Laterality Date  . BREAST SURGERY Left    due to bloody discharge  . laparascop    . LAPAROSCOPIC REMOVAL ABDOMINAL MASS  2000   LLQ    Current Outpatient Prescriptions  Medication Sig Dispense Refill  . Calcium Carb-Cholecalciferol (CALCIUM + D3 PO) Take by mouth daily.    . Cholecalciferol (VITAMIN D3) 2000 UNITS TABS Take by  mouth daily.    . hydrochlorothiazide (HYDRODIURIL) 25 MG tablet TAKE 1 TABLET (25 MG TOTAL) BY MOUTH DAILY. 90 tablet 0  . lisinopril (PRINIVIL,ZESTRIL) 5 MG tablet TAKE 1 TABLET (5 MG TOTAL) BY MOUTH DAILY. 90 tablet 0   No current facility-administered medications for this visit.      ALLERGIES: Hydrocodone and Oxycodone, cause itching  Family History  Problem Relation Age of Onset  . Hypertension Mother   . Stroke Mother   . Hypertension Father   . Diabetes Father   . Hypertension Sister   . Hypertension Brother   . Hypertension Daughter     Social History   Social History  . Marital status: Divorced    Spouse name: N/A  . Number of children: N/A  . Years of education: N/A   Occupational History  . Not on file.   Social History Main Topics  . Smoking status: Never Smoker  . Smokeless tobacco: Never Used  . Alcohol use No  . Drug use: No  . Sexual activity: Yes    Partners: Male   Other Topics Concern  . 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   HENT: Negative.   Eyes: Negative.   Respiratory: Negative.   Cardiovascular: Negative.   Gastrointestinal: Negative.   Genitourinary: Negative.   Musculoskeletal: Negative.   Skin: Negative.   Neurological: Negative.   Endo/Heme/Allergies: Negative.   Psychiatric/Behavioral: Negative.     PHYSICAL EXAMINATION:    BP 124/80 (BP Location: Right Arm, Patient Position: Sitting, Cuff Size: Large)   Pulse 84   Wt 225 lb (102.1 kg)   LMP 10/19/1998 (Approximate)   BMI 39.23 kg/m     General appearance: alert, cooperative and appears stated age Neck: no adenopathy, supple, symmetrical, trachea midline and thyroid normal to inspection and  palpation Heart: regular rate and rhythm Lungs: CTAB Abdomen: soft, non-tender; bowel sounds normal; no masses,  no organomegaly Lungs: CTAB Extremities: normal, atraumatic, no cyanosis Skin: normal color, texture and turgor, no rashes or lesions Lymph: normal cervical supraclavicular and inguinal nodes Neurologic: grossly normal   Pelvic: External genitalia:  no lesions              Urethra:  normal appearing urethra with no masses, tenderness or lesions              Bartholins and Skenes: normal                 Vagina: normal appearing vagina with normal color and discharge, no lesions              Cervix: no lesions               Sonohysterogram The procedure and risks of the procedure were reviewed with the patient, consent form was signed. A speculum was placed in the vagina and the cervix was cleansed with betadine. The sonohysterogram catheter was inserted into the uterine cavity without difficulty. Saline was infused under direct observation with the ultrasound. Two intracavitary defects were noted, c/w endometrial polyps. The catheter was removed.     Chaperone was present for exam.  ASSESSMENT Postmenopausal bleeding, endometrial polyps on sonohysterogram    PLAN Plan: hysteroscopy, polypectomy, dilation and curettage. Reviewed risks, including: bleeding, infection, uterine perforation, fluid overload, need for further sugery    An After Visit Summary was printed and given to the patient.  25 minutes face to face time of which over 50% was spent in counseling.

## 2016-05-07 NOTE — H&P (Signed)
Versions: 1. Nicholes Rough, CMA (Certified Medical Assistant) at 05/03/2016 2:41 PM - Sign at close encounter    GYNECOLOGY  VISIT   HPI: 55 y.o.   Divorced  [place] American  female   [phone] with Patient's last menstrual period was 10/19/1998 (approximate).   here for sonohysterogram for postmenopause bleeding. Endometrial biopsy inactive endometrium. PMP FSH and estradiol. She stopped her cycles in 2000 after chemotherapy for Hodgkin's Lymphoma.       GYNECOLOGIC HISTORY: Patient's last menstrual period was 10/19/1998 (approximate). Contraception:none Menopausal hormone therapy: none                 OB History    Gravida Para Term Preterm AB Living   2 2 2     2    SAB TAB Ectopic Multiple Live Births           2             Patient Active Problem List   Diagnosis Date Noted  . MDD (major depressive disorder) ([place]) 06/19/2015  . Hyperglycemia 06/19/2015  . LAD (lymphadenopathy), axillary 06/19/2015  . Essential hypertension 01/09/2015  . BMI 39.0-39.9,adult 01/09/2015  . Hyperlipemia 01/09/2015  . Left shoulder pain - managed by [facility] 06/26/2014  . Hodgkin disease, Hx of 2000, treated at [facility] 06/26/2014        Past Medical History:  Diagnosis Date  . Allergy   . Anxiety   . Depression   . Fibroid   . History of migraine   . Hodgkin disease ([place]) 1999   lymphocytic predominant Hodkin's 2000, s/p 4 weeks Rituxan, chlorambucil 10/21/99-04/19/00  . Hx of iron deficiency anemia    per [facility] notes, since childhood, possible thalassemia  . Hyperglycemia   . Hyperlipemia   . Hypertension   . LAD (lymphadenopathy), axillary    eval with onc and [facility] 2016  . Obesity   . STD (sexually transmitted disease)    gonorrhea         Past Surgical History:  Procedure Laterality Date  . BREAST SURGERY Left    due to bloody discharge  . laparascop    . LAPAROSCOPIC REMOVAL ABDOMINAL MASS  2000   LLQ          Current  Outpatient Prescriptions  Medication Sig Dispense Refill  . Calcium Carb-Cholecalciferol (CALCIUM + D3 PO) Take by mouth daily.    . Cholecalciferol (VITAMIN D3) 2000 UNITS TABS Take by mouth daily.    . hydrochlorothiazide (HYDRODIURIL) 25 MG tablet TAKE 1 TABLET (25 MG TOTAL) BY MOUTH DAILY. 90 tablet 0  . lisinopril (PRINIVIL,ZESTRIL) 5 MG tablet TAKE 1 TABLET (5 MG TOTAL) BY MOUTH DAILY. 90 tablet 0   No current facility-administered medications for this visit.      ALLERGIES: Hydrocodone and Oxycodone, cause itching       Family History  Problem Relation Age of Onset  . Hypertension Mother   . Stroke Mother   . Hypertension Father   . Diabetes Father   . Hypertension Sister   . Hypertension Brother   . Hypertension Daughter     Social History        Social History  . Marital status: Divorced    Spouse name: N/A  . Number of children: N/A  . Years of education: N/A      Occupational History  . Not on file.        Social History Main Topics  . Smoking status: Never Smoker  .  Smokeless tobacco: Never Used  . Alcohol use No  . Drug use: No  . Sexual activity: Yes    Partners: Male       Other Topics Concern  . 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   HENT: Negative.   Eyes: Negative.   Respiratory: Negative.   Cardiovascular: Negative.   Gastrointestinal: Negative.   Genitourinary: Negative.   Musculoskeletal: Negative.   Skin: Negative.   Neurological: Negative.   Endo/Heme/Allergies: Negative.   Psychiatric/Behavioral: Negative.     PHYSICAL EXAMINATION:    BP 124/80 (BP Location: Right Arm, Patient Position: Sitting, Cuff Size:  Large)   Pulse 84   Wt 225 lb (102.1 kg)   LMP 10/19/1998 (Approximate)   BMI 39.23 kg/m     General appearance: alert, cooperative and appears stated age Neck: no adenopathy, supple, symmetrical, trachea midline and thyroid normal to inspection and palpation Heart: regular rate and rhythm Lungs: CTAB Abdomen: soft, non-tender; bowel sounds normal; no masses,  no organomegaly Lungs: CTAB Extremities: normal, atraumatic, no cyanosis Skin: normal color, texture and turgor, no rashes or lesions Lymph: normal cervical supraclavicular and inguinal nodes Neurologic: grossly normal   Pelvic: External genitalia:  no lesions              Urethra:  normal appearing urethra with no masses, tenderness or lesions              Bartholins and Skenes: normal                 Vagina: normal appearing vagina with normal color and discharge, no lesions              Cervix: no lesions               Sonohysterogram The procedure and risks of the procedure were reviewed with the patient, consent form was signed. A speculum was placed in the vagina and the cervix was cleansed with betadine. The sonohysterogram catheter was inserted into the uterine cavity without difficulty. Saline was infused under direct observation with the ultrasound. Two intracavitary defects were noted, c/w endometrial polyps. The catheter was removed.     Chaperone was present for exam.  ASSESSMENT Postmenopausal bleeding, endometrial polyps on sonohysterogram    PLAN Plan: hysteroscopy, polypectomy, dilation and curettage. Reviewed risks, including: bleeding, infection, uterine perforation, fluid overload, need for further sugery

## 2016-05-09 ENCOUNTER — Other Ambulatory Visit: Payer: Self-pay | Admitting: Family Medicine

## 2016-05-13 ENCOUNTER — Encounter (HOSPITAL_COMMUNITY): Payer: Self-pay

## 2016-05-13 ENCOUNTER — Encounter (HOSPITAL_COMMUNITY)
Admission: RE | Admit: 2016-05-13 | Discharge: 2016-05-13 | Disposition: A | Discharge status: Home / Self Care | Payer: BLUE CROSS/BLUE SHIELD | Source: Ambulatory Visit | Attending: Obstetrics and Gynecology | Admitting: Obstetrics and Gynecology

## 2016-05-13 ENCOUNTER — Encounter (INDEPENDENT_AMBULATORY_CARE_PROVIDER_SITE_OTHER): Payer: Self-pay

## 2016-05-13 DIAGNOSIS — Z01810 Encounter for preprocedural cardiovascular examination: Secondary | ICD-10-CM | POA: Insufficient documentation

## 2016-05-13 DIAGNOSIS — Z01812 Encounter for preprocedural laboratory examination: Secondary | ICD-10-CM | POA: Diagnosis not present

## 2016-05-13 LAB — BASIC METABOLIC PANEL
ANION GAP: 7 (ref 5–15)
BUN: 12 mg/dL (ref 6–20)
CHLORIDE: 100 mmol/L — AB (ref 101–111)
CO2: 28 mmol/L (ref 22–32)
Calcium: 8.9 mg/dL (ref 8.9–10.3)
Creatinine, Ser: 0.76 mg/dL (ref 0.44–1.00)
GFR calc Af Amer: >60 mL/min (ref >60)
GLUCOSE: 104 mg/dL — AB (ref 65–99)
POTASSIUM: 3.2 mmol/L — AB (ref 3.5–5.1)
Sodium: 135 mmol/L (ref 135–145)

## 2016-05-13 LAB — CBC
HEMATOCRIT: 35 % — AB (ref 36.0–46.0)
HEMOGLOBIN: 11.9 g/dL — AB (ref 12.0–15.0)
MCH: 28.3 pg (ref 26.0–34.0)
MCHC: 34 g/dL (ref 30.0–36.0)
MCV: 83.3 fL (ref 78.0–100.0)
Platelets: 397 10*3/uL (ref 150–400)
RBC: 4.2 MIL/uL (ref 3.87–5.11)
RDW: 14.5 % (ref 11.5–15.5)
WBC: 9.7 10*3/uL (ref 4.0–10.5)

## 2016-05-13 NOTE — Patient Instructions (Addendum)
Your procedure is scheduled on:  Tuesday, 9/5  Enter through the Main Entrance of [facility] at: 6 AM  Pick up the phone at the desk and dial [phone].  Call this number if you have problems the morning of surgery: [phone].  Remember: Do NOT eat or drink:(including water) after midnight Monday, 9/4  Take these medicines the morning of surgery with a SIP OF WATER:  Lisinopril and HCTZ  Do NOT wear jewelry (body piercing), metal hair clips/bobby pins, make-up, or nail polish. Do NOT wear lotions, powders, or perfumes.  You may wear deoderant. Do NOT shave for 48 hours prior to surgery. Do NOT bring valuables to the hospital. Dentures may not be worn into surgery.  Have a responsible adult drive you home and stay with you for 24 hours after your procedure.  Home with [facility].

## 2016-05-16 ENCOUNTER — Encounter: Payer: Self-pay | Admitting: *Deleted

## 2016-05-16 ENCOUNTER — Telehealth: Payer: Self-pay | Admitting: *Deleted

## 2016-05-16 NOTE — Telephone Encounter (Signed)
Patient calling to make an appt to see dr Alen Blew, after her axillary lymph node biopsy 06/09/16

## 2016-05-17 ENCOUNTER — Telehealth: Payer: Self-pay | Admitting: *Deleted

## 2016-05-17 MED ORDER — POTASSIUM CHLORIDE ER 10 MEQ PO TBCR: 10.0000 meq | EXTENDED_RELEASE_TABLET | Freq: Two times a day (BID) | ORAL | 0 refills | Status: DC

## 2016-05-17 NOTE — Telephone Encounter (Signed)
-----   Message from Salvadore Dom, MD sent at 05/16/2016  3:01 PM EDT ----- Please let the patient know that I communicated with her primary. Please start her on potasium chloride 10 mEQ, 2 x a day until after her surgery next week. Please add on electrolytes for the morning of her surgery. She should f/u with her primary after the surgery (she has an appointment in 10/17). Please give her 2 weeks of the potassium supplementation.

## 2016-05-17 NOTE — Telephone Encounter (Signed)
Call to patient to advise of instructions per Dr Talbert Nan. Per DPR can leave message on cell number. Left message that medication will be called to pharmacy on file and would like her to begin tonight. Call back tomorrow to review instructions.

## 2016-05-19 ENCOUNTER — Telehealth: Payer: Self-pay | Admitting: Oncology

## 2016-05-19 NOTE — Telephone Encounter (Signed)
Message left to return call to Triage Nurse at [phone].    

## 2016-05-19 NOTE — Telephone Encounter (Signed)
lvm to inform pt of 9/26 appt date/time per LOS. Unable to schedule appt 9/27 at 830 am due to pt appt time conflict

## 2016-05-19 NOTE — Telephone Encounter (Signed)
Spoke with patient. Advised of message as seen below from [place]. Patient is agreeable and verbalizes understanding. Reports she picked up rx for potassium chloride and started this last night. She is aware she will need to continue this until after her surgery next week and will need to keep her follow up appointment with her PCP on 07/05/2016.  Routing to provider for final review. Patient agreeable to disposition. Will close encounter.

## 2016-05-23 NOTE — Anesthesia Preprocedure Evaluation (Addendum)
Anesthesia Evaluation  Patient identified by MRN, date of birth, ID band Patient awake    Reviewed: Allergy & Precautions, NPO status , Patient's Chart, lab work & pertinent test results  History of Anesthesia Complications Negative for: history of anesthetic complications  Airway Mallampati: III  TM Distance: >3 FB Neck ROM: Full    Dental  (+) Partial Upper, Partial Lower, Dental Advisory Given   Pulmonary neg pulmonary ROS, neg shortness of breath, neg sleep apnea, neg COPD, neg recent URI,    Pulmonary exam normal breath sounds clear to auscultation       Cardiovascular hypertension, Pt. on medications (-) angina(-) Past MI, (-) Cardiac Stents, (-) CABG and (-) Orthopnea  Rhythm:Regular Rate:Normal     Neuro/Psych  Headaches, neg Seizures PSYCHIATRIC DISORDERS Anxiety Depression    GI/Hepatic negative GI ROS, Neg liver ROS, neg GERD  ,  Endo/Other  neg diabetesMorbid obesity  Renal/GU negative Renal ROS     Musculoskeletal   Abdominal (+) + obese,   Peds  Hematology  (+) Blood dyscrasia (iron deficiency), anemia ,   Anesthesia Other Findings HLD, postmenopausal bleeding, h/o Hodgkin's lymphoma  Reproductive/Obstetrics                            Anesthesia Physical Anesthesia Plan  ASA: III  Anesthesia Plan: General   Post-op Pain Management:    Induction: Intravenous  Airway Management Planned: LMA  Additional Equipment:   Intra-op Plan:   Post-operative Plan: Extubation in OR  Informed Consent: I have reviewed the patients History and Physical, chart, labs and discussed the procedure including the risks, benefits and alternatives for the proposed anesthesia with the patient or authorized representative who has indicated his/her understanding and acceptance.   Dental advisory given  Plan Discussed with: CRNA  Anesthesia Plan Comments: (Risks of general anesthesia  discussed including, but not limited to, sore throat, hoarse voice, chipped/damaged teeth, injury to vocal cords, nausea and vomiting, allergic reactions, lung infection, heart attack, stroke, and death. All questions answered. )       Anesthesia Quick Evaluation

## 2016-05-24 ENCOUNTER — Ambulatory Visit (HOSPITAL_COMMUNITY)
Admission: RE | Admit: 2016-05-24 | Discharge: 2016-05-24 | Disposition: A | Discharge status: Home / Self Care | Payer: BLUE CROSS/BLUE SHIELD | Source: Ambulatory Visit | Attending: Obstetrics and Gynecology | Admitting: Obstetrics and Gynecology

## 2016-05-24 ENCOUNTER — Ambulatory Visit (HOSPITAL_COMMUNITY): Payer: BLUE CROSS/BLUE SHIELD | Admitting: Anesthesiology

## 2016-05-24 ENCOUNTER — Encounter (HOSPITAL_COMMUNITY): Payer: Self-pay | Admitting: *Deleted

## 2016-05-24 ENCOUNTER — Telehealth: Payer: Self-pay | Admitting: Family Medicine

## 2016-05-24 ENCOUNTER — Encounter (HOSPITAL_COMMUNITY)
Admission: RE | Disposition: A | Discharge status: Home / Self Care | Payer: Self-pay | Source: Ambulatory Visit | Attending: Obstetrics and Gynecology

## 2016-05-24 DIAGNOSIS — Z6839 Body mass index (BMI) 39.0-39.9, adult: Secondary | ICD-10-CM | POA: Diagnosis not present

## 2016-05-24 DIAGNOSIS — N819 Female genital prolapse, unspecified: Secondary | ICD-10-CM | POA: Diagnosis not present

## 2016-05-24 DIAGNOSIS — I10 Essential (primary) hypertension: Secondary | ICD-10-CM | POA: Diagnosis not present

## 2016-05-24 DIAGNOSIS — K219 Gastro-esophageal reflux disease without esophagitis: Secondary | ICD-10-CM | POA: Insufficient documentation

## 2016-05-24 DIAGNOSIS — N950 Postmenopausal bleeding: Secondary | ICD-10-CM | POA: Insufficient documentation

## 2016-05-24 DIAGNOSIS — N840 Polyp of corpus uteri: Secondary | ICD-10-CM | POA: Insufficient documentation

## 2016-05-24 HISTORY — PX: DILATATION & CURETTAGE/HYSTEROSCOPY WITH MYOSURE: SHX6511

## 2016-05-24 LAB — POTASSIUM: POTASSIUM: 3.2 mmol/L — AB (ref 3.5–5.1)

## 2016-05-24 SURGERY — DILATATION & CURETTAGE/HYSTEROSCOPY WITH MYOSURE
Anesthesia: General

## 2016-05-24 MED ORDER — PROPOFOL 10 MG/ML IV BOLUS
INTRAVENOUS | Status: DC | PRN
  Administered 2016-05-24: 200 mg via INTRAVENOUS

## 2016-05-24 MED ORDER — LIDOCAINE HCL 1 % IJ SOLN
INTRAMUSCULAR | Status: AC
  Filled 2016-05-24: qty 20

## 2016-05-24 MED ORDER — LACTATED RINGERS IV SOLN
INTRAVENOUS | Status: DC
  Administered 2016-05-24: 07:00:00 via INTRAVENOUS

## 2016-05-24 MED ORDER — PROMETHAZINE HCL 25 MG/ML IJ SOLN: 6.2500 mg | INTRAMUSCULAR | Status: DC | PRN

## 2016-05-24 MED ORDER — SCOPOLAMINE 1 MG/3DAYS TD PT72
MEDICATED_PATCH | TRANSDERMAL | Status: AC
  Administered 2016-05-24: 1.5 mg via TRANSDERMAL
  Filled 2016-05-24: qty 1

## 2016-05-24 MED ORDER — FENTANYL CITRATE (PF) 100 MCG/2ML IJ SOLN
INTRAMUSCULAR | Status: DC | PRN
Start: 2016-05-24 — End: 2016-05-24
  Administered 2016-05-24: 100 ug via INTRAVENOUS

## 2016-05-24 MED ORDER — SCOPOLAMINE 1 MG/3DAYS TD PT72
1.0000 | MEDICATED_PATCH | Freq: Once | TRANSDERMAL | Status: DC
  Administered 2016-05-24: 1.5 mg via TRANSDERMAL

## 2016-05-24 MED ORDER — FENTANYL CITRATE (PF) 100 MCG/2ML IJ SOLN: 25.0000 ug | INTRAMUSCULAR | Status: DC | PRN

## 2016-05-24 MED ORDER — DEXAMETHASONE SODIUM PHOSPHATE 4 MG/ML IJ SOLN
INTRAMUSCULAR | Status: AC
  Filled 2016-05-24: qty 1

## 2016-05-24 MED ORDER — KETOROLAC TROMETHAMINE 30 MG/ML IJ SOLN
INTRAMUSCULAR | Status: DC | PRN
  Administered 2016-05-24: 30 mg via INTRAVENOUS

## 2016-05-24 MED ORDER — CEFAZOLIN SODIUM-DEXTROSE 2-4 GM/100ML-% IV SOLN: 2.0000 g | INTRAVENOUS | Status: DC

## 2016-05-24 MED ORDER — ONDANSETRON HCL 4 MG/2ML IJ SOLN
INTRAMUSCULAR | Status: AC
  Filled 2016-05-24: qty 2

## 2016-05-24 MED ORDER — LIDOCAINE HCL (CARDIAC) 20 MG/ML IV SOLN
INTRAVENOUS | Status: AC
  Filled 2016-05-24: qty 5

## 2016-05-24 MED ORDER — DEXAMETHASONE SODIUM PHOSPHATE 10 MG/ML IJ SOLN
INTRAMUSCULAR | Status: DC | PRN
  Administered 2016-05-24: 4 mg via INTRAVENOUS

## 2016-05-24 MED ORDER — MIDAZOLAM HCL 2 MG/2ML IJ SOLN
INTRAMUSCULAR | Status: DC | PRN
  Administered 2016-05-24: 2 mg via INTRAVENOUS

## 2016-05-24 MED ORDER — SODIUM CHLORIDE 0.9 % IR SOLN
Status: DC | PRN
  Administered 2016-05-24: 3000 mL

## 2016-05-24 MED ORDER — KETOROLAC TROMETHAMINE 30 MG/ML IJ SOLN
INTRAMUSCULAR | Status: AC
  Filled 2016-05-24: qty 1

## 2016-05-24 MED ORDER — MIDAZOLAM HCL 2 MG/2ML IJ SOLN
INTRAMUSCULAR | Status: AC
  Filled 2016-05-24: qty 2

## 2016-05-24 MED ORDER — FENTANYL CITRATE (PF) 100 MCG/2ML IJ SOLN
INTRAMUSCULAR | Status: AC
  Filled 2016-05-24: qty 2

## 2016-05-24 MED ORDER — LIDOCAINE HCL (CARDIAC) 20 MG/ML IV SOLN
INTRAVENOUS | Status: DC | PRN
  Administered 2016-05-24: 60 mg via INTRAVENOUS

## 2016-05-24 MED ORDER — LACTATED RINGERS IV SOLN: INTRAVENOUS | Status: DC

## 2016-05-24 MED ORDER — PROPOFOL 10 MG/ML IV BOLUS
INTRAVENOUS | Status: AC
  Filled 2016-05-24: qty 40

## 2016-05-24 MED ORDER — ONDANSETRON HCL 4 MG/2ML IJ SOLN
INTRAMUSCULAR | Status: DC | PRN
  Administered 2016-05-24: 4 mg via INTRAVENOUS

## 2016-05-24 SURGICAL SUPPLY — 19 items
CANISTER SUCT 3000ML (MISCELLANEOUS) ×3 IMPLANT
CATH ROBINSON RED A/P 16FR (CATHETERS) ×1 IMPLANT
CLOTH BEACON ORANGE TIMEOUT ST (SAFETY) ×2 IMPLANT
CONTAINER PREFILL 10% NBF 60ML (FORM) ×4 IMPLANT
DEVICE MYOSURE LITE (MISCELLANEOUS) IMPLANT
DEVICE MYOSURE REACH (MISCELLANEOUS) ×1 IMPLANT
FILTER ARTHROSCOPY CONVERTOR (FILTER) ×2 IMPLANT
GLOVE BIO SURGEON STRL SZ 6.5 (GLOVE) ×2 IMPLANT
GLOVE BIOGEL PI IND STRL 7.0 (GLOVE) ×2 IMPLANT
GLOVE BIOGEL PI INDICATOR 7.0 (GLOVE) ×2
GOWN STRL REUS W/TWL LRG LVL3 (GOWN DISPOSABLE) ×4 IMPLANT
PACK VAGINAL MINOR WOMEN LF (CUSTOM PROCEDURE TRAY) ×2 IMPLANT
PAD OB MATERNITY 4.3X12.25 (PERSONAL CARE ITEMS) ×2 IMPLANT
SEAL ROD LENS SCOPE MYOSURE (ABLATOR) ×2 IMPLANT
SYR 20CC LL (SYRINGE) IMPLANT
TOWEL OR 17X24 6PK STRL BLUE (TOWEL DISPOSABLE) ×4 IMPLANT
TUBING AQUILEX INFLOW (TUBING) ×2 IMPLANT
TUBING AQUILEX OUTFLOW (TUBING) ×2 IMPLANT
WATER STERILE IRR 1000ML POUR (IV SOLUTION) ×2 IMPLANT

## 2016-05-24 NOTE — Interval H&P Note (Signed)
History and Physical Interval Note:  05/24/2016 7:13 AM  Whitney Santiago  has presented today for surgery, with the diagnosis of PMB, endometrial polyp  The various methods of treatment have been discussed with the patient and family. After consideration of risks, benefits and other options for treatment, the patient has consented to  Procedure(s): [place] (N/A) as a surgical intervention .  The patient's history has been reviewed, patient examined, no change in status, stable for surgery.  I have reviewed the patient's chart and labs.  Questions were answered to the patient's satisfaction.     Salvadore Dom

## 2016-05-24 NOTE — Anesthesia Postprocedure Evaluation (Signed)
Anesthesia Post Note  Patient: Whitney Santiago  Procedure(s) Performed: Procedure(s) (LRB): DILATATION & CURETTAGE/HYSTEROSCOPY WITH MYOSURE (N/A)  Patient location during evaluation: PACU Anesthesia Type: General Level of consciousness: awake and alert Pain management: pain level controlled Vital Signs Assessment: post-procedure vital signs reviewed and stable Respiratory status: spontaneous breathing, nonlabored ventilation and respiratory function stable Cardiovascular status: blood pressure returned to baseline and stable Postop Assessment: no signs of nausea or vomiting Anesthetic complications: no     Last Vitals:  Vitals:   05/24/16 0815 05/24/16 0830  BP: 124/77 134/83  Pulse: 69 65  Resp: 16 16  Temp:      Last Pain:  Vitals:   05/24/16 0830  TempSrc:   PainSc: 2    Pain Goal: Patients Stated Pain Goal: 3 (05/24/16 0650)               Nilda Simmer

## 2016-05-24 NOTE — Addendum Note (Signed)
Addendum  created 05/24/16 1119 by Jonna Munro, CRNA   Anesthesia Intra Flowsheets edited

## 2016-05-24 NOTE — Anesthesia Procedure Notes (Signed)
Procedure Name: LMA Insertion Date/Time: 05/24/2016 7:26 AM Performed by: Jonna Munro Pre-anesthesia Checklist: Patient identified, Emergency Drugs available, Suction available, Patient being monitored and Timeout performed Patient Re-evaluated:Patient Re-evaluated prior to inductionOxygen Delivery Method: Circle system utilized Preoxygenation: Pre-oxygenation with 100% oxygen Intubation Type: IV induction LMA: LMA inserted LMA Size: 4.0 Number of attempts: 1 Placement Confirmation: positive ETCO2 and breath sounds checked- equal and bilateral Tube secured with: Tape Dental Injury: Teeth and Oropharynx as per pre-operative assessment

## 2016-05-24 NOTE — Transfer of Care (Signed)
Immediate Anesthesia Transfer of Care Note  Patient: Whitney Santiago  Procedure(s) Performed: Procedure(s): DILATATION & CURETTAGE/HYSTEROSCOPY WITH MYOSURE (N/A)  Patient Location: PACU  Anesthesia Type:General  Level of Consciousness: awake, alert  and oriented  Airway & Oxygen Therapy: Patient Spontanous Breathing and Patient connected to nasal cannula oxygen  Post-op Assessment: Report given to RN and Post -op Vital signs reviewed and stable  Post vital signs: Reviewed and stable  Last Vitals:  Vitals:   05/24/16 0650  BP: 117/69  Pulse: 77  Resp: 18  Temp: 36.7 C    Last Pain:  Vitals:   05/24/16 0650  TempSrc: Oral      Patients Stated Pain Goal: 3 ([id number])  Complications: No apparent anesthesia complications

## 2016-05-24 NOTE — Telephone Encounter (Signed)
I called the pt and informed her of the message below.  Patient is confused as she was told to stop this per the OB/GYN's office and does not know what to do at this point?  I tried reading the note from Dr Talbert Nan and her note dated 8/28-8/31 and was not sure what to tell the pt in regards to whether or not she should take the potassium at this point.  Message sent to Dr [name] to address. Patient is aware this may be addressed Thursday when Dr [name] returns to the office.

## 2016-05-24 NOTE — Telephone Encounter (Signed)
Advise to take the potassium daily if low. Advise have her surgeon check before surgery. Thanks. It is only very mildly low and was ok last time she checked here. Thanks.

## 2016-05-24 NOTE — Telephone Encounter (Signed)
Pt wanted to let you know that she stopped the potassium for her surgery today and when they [facility]) checked it it was still low.  Pt will be having a 2nd surgery on the 21st and she wants to know if you want her to continue to stop taking the potassium.

## 2016-05-24 NOTE — Op Note (Signed)
Preoperative Diagnosis: postmenopausal bleeding, endometrial polyps  Postoperative Diagnosis: same, genital prolapse  Procedure: Hysteroscopy, polypectomy, dilation and curettage  Surgeon: Dr Sumner Boast  Assistants: None  Anesthesia: General via LMA  EBL: 5 cc  Fluids: 500 cc LR  Fluid deficit: 144 cc  Urine output: not recorded  Indications for surgery: The patient is a 55 yo female, who presented with postmenopausal bleeding. Work up included an endometrial biopsy with inactive endometrium, fibroid uterus on ultrasound and 2 small polyps on sonohysterogram. No intracavitary myomas The risks of the surgery were reviewed with the patient and the consent form was signed prior to her surgery.  Findings: EUA: slightly enlarged, irregularly shaped uterus, grade 2 uterine prolapse (comes to within 1 cm of the cervix), grade 1-2 cystocele. Hysteroscopy revealed 2 small endometrial polyps, otherwise thin endometrium.   Specimens: endometrial polyp, endocervical curettage, endometrial curettage   Procedure: The patient was taken to the operating room with an IV in place. She was placed in the dorsal lithotomy position and anesthesia was administered. She was prepped and draped in the usual sterile fashion for a vaginal procedure. She voided on the way to the OR. A weighted speculum was placed in the vagina and a single tooth tenaculum was placed on the anterior lip of the cervix. The cervix was dilated to a #7 hagar dilator. The uterus was sounded to 9 cm. The myosure hysteroscope was inserted into the uterine cavity. With continuous infusion of normal saline, the uterine cavity was visualized with the above findings. The myosure reach resectoscope was used to remove the polyps which were at the fundus. The myosure was then removed. An endocervical curettage was performed. The endometrial cavity was then curetted with the small sharp curette. The cavity had the characteristically gritty texture  at the end of the procedure. The curette and the single tooth tenaculum were removed. The speculum was removed. The patients perineum was cleansed of betadine and she was taken out of the dorsal lithotomy position.  Upon awakening the LMA was removed and the patient was transferred to the recovery room in stable and awake condition.  The sponge and instrument count were correct. There were no complications.

## 2016-05-24 NOTE — Discharge Instructions (Signed)
DISCHARGE INSTRUCTIONS: HYSTEROSCOPY / ENDOMETRIAL ABLATION The following instructions have been prepared to help you care for yourself upon your return home.  May Remove Scop patch on or before 05/27/16  May take Ibuprofen after 1:45pm today   Personal hygiene:  Use sanitary pads for vaginal drainage, not tampons.  Shower the day after your procedure.  NO tub baths, pools or Jacuzzis for 2-3 weeks.  Wipe front to back after using the bathroom.  Activity and limitations:  Do NOT drive or operate any equipment for 24 hours. The effects of anesthesia are still present and drowsiness may result.  Do NOT rest in bed all day.  Walking is encouraged.  Walk up and down stairs slowly.  You may resume your normal activity in one to two days or as indicated by your physician. Sexual activity: NO intercourse for at least 2 weeks after the procedure, or as indicated by your Doctor.  Diet: Eat a light meal as desired this evening. You may resume your usual diet tomorrow.  Return to Work: You may resume your work activities in one to two days or as indicated by Marine scientist.  What to expect after your surgery: Expect to have vaginal bleeding/discharge for 2-3 days and spotting for up to 10 days. It is not unusual to have soreness for up to 1-2 weeks. You may have a slight burning sensation when you urinate for the first day. Mild cramps may continue for a couple of days. You may have a regular period in 2-6 weeks.  Call your doctor for any of the following:  Excessive vaginal bleeding or clotting, saturating and changing one pad every hour.  Inability to urinate 6 hours after discharge from hospital.  Pain not relieved by pain medication.  Fever of 100.4 F or greater.  Unusual vaginal discharge or odor.  Return to office _________________Call for an appointment ___________________ Patients signature: ______________________ Nurses signature ________________________  Post  Anesthesia Care Unit [phone]   Post Anesthesia Home Care Instructions  Activity: Get plenty of rest for the remainder of the day. A responsible adult should stay with you for 24 hours following the procedure.  For the next 24 hours, DO NOT: -Drive a car -Paediatric nurse -Drink alcoholic beverages -Take any medication unless instructed by your physician -Make any legal decisions or sign important papers.  Meals: Start with liquid foods such as gelatin or soup. Progress to regular foods as tolerated. Avoid greasy, spicy, heavy foods. If nausea and/or vomiting occur, drink only clear liquids until the nausea and/or vomiting subsides. Call your physician if vomiting continues.  Special Instructions/Symptoms: Your throat may feel dry or sore from the anesthesia or the breathing tube placed in your throat during surgery. If this causes discomfort, gargle with warm salt water. The discomfort should disappear within 24 hours.  If you had a scopolamine patch placed behind your ear for the management of post- operative nausea and/or vomiting:  1. The medication in the patch is effective for 72 hours, after which it should be removed.  Wrap patch in a tissue and discard in the trash. Wash hands thoroughly with soap and water. 2. You may remove the patch earlier than 72 hours if you experience unpleasant side effects which may include dry mouth, dizziness or visual disturbances. 3. Avoid touching the patch. Wash your hands with soap and water after contact with the patch.

## 2016-05-25 ENCOUNTER — Encounter (HOSPITAL_COMMUNITY): Payer: Self-pay | Admitting: Obstetrics and Gynecology

## 2016-05-26 ENCOUNTER — Telehealth: Payer: Self-pay | Admitting: Family Medicine

## 2016-05-26 NOTE — Telephone Encounter (Signed)
LM for pt to return call. Sharyn Lull could you please assist this pt. See message and per our conversation.

## 2016-05-26 NOTE — Telephone Encounter (Signed)
Dr. [name] pt returned your call.

## 2016-05-26 NOTE — Telephone Encounter (Signed)
Error/ltd ° °

## 2016-05-26 NOTE — Telephone Encounter (Signed)
Talked with pt. She plans to take K+10-50mEq daily until follow up with me in oct. Very mildly low potassium - not unusual in pt taking hctz.

## 2016-05-31 NOTE — Pre-Procedure Instructions (Signed)
Arayiah Mancill  05/31/2016      [id number] [address] [place], [place] [address] Phone: [phone] Fax: [phone]  [id number] - [place], [place] [id number] [place] [id number] Phone: [phone] Fax: [phone]    Your procedure is scheduled on  Thursday, September 21.  Report to [facility] Admitting at 0830 AM               Your surgery or procedure is scheduled for 10:30 AM    Call this number if you have problems the morning of [phone]                For any other questions, please call [phone], Monday - Friday 8 AM - 4 PM.   Remember:  Do not eat food or drink liquids after midnight Wednesday, September 20.  Take these medicines the morning of surgery with A SIP OF WATER : None                [facility]- Preparing For Surgery  Before surgery, you can play an important role. Because skin is not sterile, your skin needs to be as free of germs as possible. You can reduce the number of germs on your skin by washing with CHG (chlorahexidine gluconate) Soap before surgery.  CHG is an antiseptic cleaner which kills germs and bonds with the skin to continue killing germs even after washing.  Please do not use if you have an allergy to CHG or antibacterial soaps. If your skin becomes reddened/irritated stop using the CHG.  Do not shave (including legs and underarms) for at least 48 hours prior to first CHG shower. It is OK to shave your face.  Please follow these instructions carefully.   1. Shower the NIGHT BEFORE SURGERY and the MORNING OF SURGERY with CHG.   2. If you chose to wash your hair, wash your hair first as usual with your normal shampoo.  3. After you shampoo, rinse your hair and body thoroughly to remove the shampoo.  4. Use CHG as you would any other liquid soap. You can apply CHG  directly to the skin and wash gently with a scrungie or a clean washcloth.   5. Apply the CHG Soap to your body ONLY FROM THE NECK DOWN.  Do not use on open wounds or open sores. Avoid contact with your eyes, ears, mouth and genitals (private parts). Wash genitals (private parts) with your normal soap.  6. Wash thoroughly, paying special attention to the area where your surgery will be performed.  7. Thoroughly rinse your body with warm water from the neck down.  8. DO NOT shower/wash with your normal soap after using and rinsing off the CHG Soap.  9. Pat yourself dry with a CLEAN TOWEL.   10. Wear CLEAN PAJAMAS   11. Place CLEAN SHEETS on your bed the night of your first shower and DO NOT SLEEP WITH PETS.  Day of Surgery: Do not apply any deodorants/lotions. Please wear clean clothes to the hospital/surgery center.   Do not wear jewelry, make-up or nail polish.  Do not wear lotions, powders, or perfumes, or deoderant.  Do not shave 48 hours prior to surgery.  Men may shave face and neck.  Do not bring valuables to the hospital.  [facility] is not responsible for any belongings or valuables.  Contacts, dentures or bridgework may not be worn into surgery.  Leave your suitcase in the car.  After surgery it may be brought to your room.  For patients admitted to the hospital, discharge time will be determined by your treatment team.  Patients discharged the day of surgery will not be allowed to drive home.   Name and phone number of your driver:  - Special instructions:  -  Please read over the following fact sheets that you were given: -

## 2016-06-01 ENCOUNTER — Encounter (HOSPITAL_COMMUNITY): Payer: Self-pay

## 2016-06-01 ENCOUNTER — Encounter (HOSPITAL_COMMUNITY)
Admission: RE | Admit: 2016-06-01 | Discharge: 2016-06-01 | Disposition: A | Discharge status: Home / Self Care | Payer: BLUE CROSS/BLUE SHIELD | Source: Ambulatory Visit | Attending: General Surgery | Admitting: General Surgery

## 2016-06-01 DIAGNOSIS — Z01812 Encounter for preprocedural laboratory examination: Secondary | ICD-10-CM | POA: Diagnosis not present

## 2016-06-01 DIAGNOSIS — R590 Localized enlarged lymph nodes: Secondary | ICD-10-CM | POA: Insufficient documentation

## 2016-06-01 LAB — CBC
HCT: 37.1 % (ref 36.0–46.0)
Hemoglobin: 11.8 g/dL — ABNORMAL LOW (ref 12.0–15.0)
MCH: 27.4 pg (ref 26.0–34.0)
MCHC: 31.8 g/dL (ref 30.0–36.0)
MCV: 86.3 fL (ref 78.0–100.0)
PLATELETS: 368 10*3/uL (ref 150–400)
RBC: 4.3 MIL/uL (ref 3.87–5.11)
RDW: 14.4 % (ref 11.5–15.5)
WBC: 10.2 10*3/uL (ref 4.0–10.5)

## 2016-06-01 LAB — BASIC METABOLIC PANEL
Anion gap: 9 (ref 5–15)
BUN: 13 mg/dL (ref 6–20)
CHLORIDE: 102 mmol/L (ref 101–111)
CO2: 26 mmol/L (ref 22–32)
CREATININE: 0.87 mg/dL (ref 0.44–1.00)
Calcium: 9 mg/dL (ref 8.9–10.3)
Glucose, Bld: 123 mg/dL — ABNORMAL HIGH (ref 65–99)
POTASSIUM: 3.3 mmol/L — AB (ref 3.5–5.1)
SODIUM: 137 mmol/L (ref 135–145)

## 2016-06-01 NOTE — Progress Notes (Signed)
PCP - Colin Benton Cardiologist - denies  Chest x-ray - not needed EKG - 05/13/16 Stress Test - denies ECHO - denies Cardiac Cath - denies    Patient denies shortness of breath, fever, cough and chest pain at PAT appointment

## 2016-06-09 ENCOUNTER — Ambulatory Visit (HOSPITAL_COMMUNITY)
Admission: RE | Admit: 2016-06-09 | Discharge: 2016-06-09 | Disposition: A | Discharge status: Home / Self Care | Payer: BLUE CROSS/BLUE SHIELD | Source: Ambulatory Visit | Attending: General Surgery | Admitting: General Surgery

## 2016-06-09 ENCOUNTER — Ambulatory Visit (HOSPITAL_COMMUNITY): Payer: BLUE CROSS/BLUE SHIELD | Admitting: Certified Registered Nurse Anesthetist

## 2016-06-09 ENCOUNTER — Encounter (HOSPITAL_COMMUNITY): Payer: Self-pay | Admitting: *Deleted

## 2016-06-09 ENCOUNTER — Encounter (HOSPITAL_COMMUNITY)
Admission: RE | Disposition: A | Discharge status: Home / Self Care | Payer: Self-pay | Source: Ambulatory Visit | Attending: General Surgery

## 2016-06-09 ENCOUNTER — Ambulatory Visit: Payer: BLUE CROSS/BLUE SHIELD | Admitting: Obstetrics and Gynecology

## 2016-06-09 DIAGNOSIS — R591 Generalized enlarged lymph nodes: Secondary | ICD-10-CM | POA: Diagnosis not present

## 2016-06-09 DIAGNOSIS — C8194 Hodgkin lymphoma, unspecified, lymph nodes of axilla and upper limb: Secondary | ICD-10-CM | POA: Insufficient documentation

## 2016-06-09 DIAGNOSIS — I10 Essential (primary) hypertension: Secondary | ICD-10-CM | POA: Diagnosis not present

## 2016-06-09 DIAGNOSIS — R590 Localized enlarged lymph nodes: Secondary | ICD-10-CM | POA: Diagnosis present

## 2016-06-09 DIAGNOSIS — C8104 Nodular lymphocyte predominant Hodgkin lymphoma, lymph nodes of axilla and upper limb: Secondary | ICD-10-CM | POA: Diagnosis not present

## 2016-06-09 HISTORY — PX: AXILLARY LYMPH NODE BIOPSY: SHX5737

## 2016-06-09 SURGERY — AXILLARY LYMPH NODE BIOPSY
Anesthesia: General | Site: Axilla | Laterality: Right

## 2016-06-09 MED ORDER — TRAMADOL HCL 50 MG PO TABS
100.0000 mg | ORAL_TABLET | Freq: Four times a day (QID) | ORAL | Status: DC | PRN
  Administered 2016-06-09: 100 mg via ORAL

## 2016-06-09 MED ORDER — TRAMADOL HCL 50 MG PO TABS
ORAL_TABLET | ORAL | Status: AC
  Filled 2016-06-09: qty 1

## 2016-06-09 MED ORDER — FENTANYL CITRATE (PF) 100 MCG/2ML IJ SOLN
INTRAMUSCULAR | Status: AC
  Filled 2016-06-09: qty 2

## 2016-06-09 MED ORDER — METHYLENE BLUE 0.5 % INJ SOLN
INTRAVENOUS | Status: AC
  Filled 2016-06-09: qty 10

## 2016-06-09 MED ORDER — PROMETHAZINE HCL 25 MG/ML IJ SOLN: 6.2500 mg | INTRAMUSCULAR | Status: DC | PRN

## 2016-06-09 MED ORDER — FENTANYL CITRATE (PF) 100 MCG/2ML IJ SOLN
25.0000 ug | INTRAMUSCULAR | Status: DC | PRN
  Administered 2016-06-09 (×2): 50 ug via INTRAVENOUS

## 2016-06-09 MED ORDER — ACETAMINOPHEN 650 MG RE SUPP: 650.0000 mg | RECTAL | Status: DC | PRN

## 2016-06-09 MED ORDER — FENTANYL CITRATE (PF) 100 MCG/2ML IJ SOLN
INTRAMUSCULAR | Status: AC
  Filled 2016-06-09: qty 4

## 2016-06-09 MED ORDER — KETOROLAC TROMETHAMINE 30 MG/ML IJ SOLN
INTRAMUSCULAR | Status: DC | PRN
  Administered 2016-06-09: 30 mg via INTRAVENOUS

## 2016-06-09 MED ORDER — TRAMADOL HCL 50 MG PO TABS: 50.0000 mg | ORAL_TABLET | ORAL | 0 refills | Status: DC | PRN

## 2016-06-09 MED ORDER — FENTANYL CITRATE (PF) 100 MCG/2ML IJ SOLN
25.0000 ug | INTRAMUSCULAR | Status: DC | PRN
  Administered 2016-06-09: 50 ug via INTRAVENOUS

## 2016-06-09 MED ORDER — CHLORHEXIDINE GLUCONATE CLOTH 2 % EX PADS: 6.0000 | MEDICATED_PAD | Freq: Once | CUTANEOUS | Status: DC

## 2016-06-09 MED ORDER — BUPIVACAINE HCL (PF) 0.25 % IJ SOLN
INTRAMUSCULAR | Status: DC | PRN
  Administered 2016-06-09: 16 mL

## 2016-06-09 MED ORDER — BUPIVACAINE HCL (PF) 0.25 % IJ SOLN
INTRAMUSCULAR | Status: AC
  Filled 2016-06-09: qty 30

## 2016-06-09 MED ORDER — PROPOFOL 10 MG/ML IV BOLUS
INTRAVENOUS | Status: DC | PRN
  Administered 2016-06-09: 200 mg via INTRAVENOUS

## 2016-06-09 MED ORDER — SODIUM CHLORIDE 0.9 % IJ SOLN
INTRAMUSCULAR | Status: AC
  Filled 2016-06-09: qty 10

## 2016-06-09 MED ORDER — LIDOCAINE 2% (20 MG/ML) 5 ML SYRINGE
INTRAMUSCULAR | Status: DC | PRN
  Administered 2016-06-09: 100 mg via INTRAVENOUS

## 2016-06-09 MED ORDER — ONDANSETRON HCL 4 MG/2ML IJ SOLN
INTRAMUSCULAR | Status: DC | PRN
  Administered 2016-06-09: 4 mg via INTRAVENOUS

## 2016-06-09 MED ORDER — MIDAZOLAM HCL 2 MG/2ML IJ SOLN
INTRAMUSCULAR | Status: AC
  Filled 2016-06-09: qty 2

## 2016-06-09 MED ORDER — FENTANYL CITRATE (PF) 100 MCG/2ML IJ SOLN
INTRAMUSCULAR | Status: DC | PRN
  Administered 2016-06-09: 50 ug via INTRAVENOUS
  Administered 2016-06-09 (×3): 25 ug via INTRAVENOUS
  Administered 2016-06-09: 50 ug via INTRAVENOUS
  Administered 2016-06-09 (×3): 25 ug via INTRAVENOUS

## 2016-06-09 MED ORDER — SODIUM CHLORIDE 0.9% FLUSH: 3.0000 mL | INTRAVENOUS | Status: DC | PRN

## 2016-06-09 MED ORDER — CEFAZOLIN SODIUM 1 G IJ SOLR
INTRAMUSCULAR | Status: DC | PRN
  Administered 2016-06-09: 2 g via INTRAMUSCULAR

## 2016-06-09 MED ORDER — MIDAZOLAM HCL 5 MG/5ML IJ SOLN
INTRAMUSCULAR | Status: DC | PRN
  Administered 2016-06-09: 2 mg via INTRAVENOUS

## 2016-06-09 MED ORDER — LACTATED RINGERS IV SOLN
INTRAVENOUS | Status: DC
  Administered 2016-06-09 (×3): via INTRAVENOUS

## 2016-06-09 MED ORDER — ACETAMINOPHEN 325 MG PO TABS: 650.0000 mg | ORAL_TABLET | ORAL | Status: DC | PRN

## 2016-06-09 SURGICAL SUPPLY — 51 items
APL SKNCLS STERI-STRIP NONHPOA (GAUZE/BANDAGES/DRESSINGS) ×1
APPLIER CLIP 9.375 MED OPEN (MISCELLANEOUS) ×2
APR CLP MED 9.3 20 MLT OPN (MISCELLANEOUS) ×1
BENZOIN TINCTURE PRP APPL 2/3 (GAUZE/BANDAGES/DRESSINGS) ×2 IMPLANT
BINDER BREAST LRG (GAUZE/BANDAGES/DRESSINGS) IMPLANT
BINDER BREAST XLRG (GAUZE/BANDAGES/DRESSINGS) IMPLANT
CANISTER SUCTION 2500CC (MISCELLANEOUS) ×2 IMPLANT
CHLORAPREP W/TINT 26ML (MISCELLANEOUS) ×2 IMPLANT
CLIP APPLIE 9.375 MED OPEN (MISCELLANEOUS) IMPLANT
CLSR STERI-STRIP ANTIMIC 1/2X4 (GAUZE/BANDAGES/DRESSINGS) ×1 IMPLANT
CONT SPEC 4OZ CLIKSEAL STRL BL (MISCELLANEOUS) ×2 IMPLANT
COVER PROBE W GEL 5X96 (DRAPES) ×2 IMPLANT
COVER SURGICAL LIGHT HANDLE (MISCELLANEOUS) ×2 IMPLANT
DECANTER SPIKE VIAL GLASS SM (MISCELLANEOUS) ×2 IMPLANT
DEVICE DUBIN SPECIMEN MAMMOGRA (MISCELLANEOUS) IMPLANT
DRAPE CHEST BREAST 15X10 FENES (DRAPES) ×2 IMPLANT
DRAPE UTILITY XL STRL (DRAPES) ×3 IMPLANT
DRSG OPSITE 4X5.5 SM (GAUZE/BANDAGES/DRESSINGS) ×4 IMPLANT
DRSG TEGADERM 4X4.75 (GAUZE/BANDAGES/DRESSINGS) ×1 IMPLANT
ELECT CAUTERY BLADE 6.4 (BLADE) ×2 IMPLANT
ELECT REM PT RETURN 9FT ADLT (ELECTROSURGICAL) ×2
ELECTRODE REM PT RTRN 9FT ADLT (ELECTROSURGICAL) ×1 IMPLANT
GAUZE SPONGE 4X4 12PLY STRL (GAUZE/BANDAGES/DRESSINGS) ×2 IMPLANT
GLOVE BIOGEL PI IND STRL 8 (GLOVE) ×1 IMPLANT
GLOVE BIOGEL PI INDICATOR 8 (GLOVE) ×1
GLOVE ECLIPSE 8.0 STRL XLNG CF (GLOVE) ×2 IMPLANT
GOWN STRL REUS W/ TWL LRG LVL3 (GOWN DISPOSABLE) ×2 IMPLANT
GOWN STRL REUS W/TWL LRG LVL3 (GOWN DISPOSABLE) ×6
KIT BASIN OR (CUSTOM PROCEDURE TRAY) ×2 IMPLANT
KIT ROOM TURNOVER OR (KITS) ×2 IMPLANT
NDL 18GX1X1/2 (RX/OR ONLY) (NEEDLE) IMPLANT
NDL FILTER BLUNT 18X1 1/2 (NEEDLE) IMPLANT
NDL HYPO 25GX1X1/2 BEV (NEEDLE) ×1 IMPLANT
NEEDLE 18GX1X1/2 (RX/OR ONLY) (NEEDLE) IMPLANT
NEEDLE FILTER BLUNT 18X 1/2SAF (NEEDLE)
NEEDLE FILTER BLUNT 18X1 1/2 (NEEDLE) IMPLANT
NEEDLE HYPO 25GX1X1/2 BEV (NEEDLE) ×2 IMPLANT
NS IRRIG 1000ML POUR BTL (IV SOLUTION) ×2 IMPLANT
PACK SURGICAL SETUP 50X90 (CUSTOM PROCEDURE TRAY) ×2 IMPLANT
PAD ARMBOARD 7.5X6 YLW CONV (MISCELLANEOUS) ×2 IMPLANT
PENCIL BUTTON HOLSTER BLD 10FT (ELECTRODE) ×2 IMPLANT
SPONGE GAUZE 4X4 12PLY STER LF (GAUZE/BANDAGES/DRESSINGS) ×1 IMPLANT
SPONGE LAP 4X18 X RAY DECT (DISPOSABLE) ×5 IMPLANT
STRIP CLOSURE SKIN 1/2X4 (GAUZE/BANDAGES/DRESSINGS) ×2 IMPLANT
SUT MNCRL AB 4-0 PS2 18 (SUTURE) ×2 IMPLANT
SUT VIC AB 3-0 SH 18 (SUTURE) ×2 IMPLANT
SYR CONTROL 10ML LL (SYRINGE) ×2 IMPLANT
TOWEL OR 17X24 6PK STRL BLUE (TOWEL DISPOSABLE) ×2 IMPLANT
TOWEL OR 17X26 10 PK STRL BLUE (TOWEL DISPOSABLE) ×2 IMPLANT
TUBE CONNECTING 12X1/4 (SUCTIONS) ×2 IMPLANT
YANKAUER SUCT BULB TIP NO VENT (SUCTIONS) ×2 IMPLANT

## 2016-06-09 NOTE — Anesthesia Procedure Notes (Signed)
Procedure Name: LMA Insertion Date/Time: 06/09/2016 10:49 AM Performed by: Everlean Cherry A Pre-anesthesia Checklist: Emergency Drugs available, Patient identified, Suction available and Patient being monitored Patient Re-evaluated:Patient Re-evaluated prior to inductionOxygen Delivery Method: Circle system utilized Preoxygenation: Pre-oxygenation with 100% oxygen Intubation Type: IV induction Ventilation: Mask ventilation without difficulty LMA: LMA inserted LMA Size: 4.0 Number of attempts: 1 Placement Confirmation: positive ETCO2 and breath sounds checked- equal and bilateral Tube secured with: Tape Dental Injury: Teeth and Oropharynx as per pre-operative assessment

## 2016-06-09 NOTE — Anesthesia Preprocedure Evaluation (Signed)
Anesthesia Evaluation  Patient identified by MRN, date of birth, ID band Patient awake    Reviewed: Allergy & Precautions, NPO status , Patient's Chart, lab work & pertinent test results  Airway Mallampati: II       Dental   Pulmonary    breath sounds clear to auscultation       Cardiovascular hypertension,  Rhythm:Regular Rate:Normal     Neuro/Psych    GI/Hepatic negative GI ROS, Neg liver ROS,   Endo/Other  negative endocrine ROS  Renal/GU negative Renal ROS     Musculoskeletal   Abdominal   Peds  Hematology negative hematology ROS (+)   Anesthesia Other Findings   Reproductive/Obstetrics                             Anesthesia Physical Anesthesia Plan  ASA: III  Anesthesia Plan: General   Post-op Pain Management:    Induction: Intravenous  Airway Management Planned: LMA  Additional Equipment:   Intra-op Plan:   Post-operative Plan: Possible Post-op intubation/ventilation and Extubation in OR  Informed Consent:   Dental advisory given  Plan Discussed with: CRNA and Anesthesiologist  Anesthesia Plan Comments:         Anesthesia Quick Evaluation

## 2016-06-09 NOTE — Interval H&P Note (Signed)
History and Physical Interval Note:  06/09/2016 10:32 AM  Whitney Santiago  has presented today for surgery, with the diagnosis of Right axillary lymphadenopathy  The various methods of treatment have been discussed with the patient and family. After consideration of risks, benefits and other options for treatment, the patient has consented to  Procedure(s): AXILLARY LYMPH NODE BIOPSY (Right) as a surgical intervention .  The patient's history has been reviewed, patient examined, no change in status, stable for surgery.  I have reviewed the patient's chart and labs.  Questions were answered to the patient's satisfaction.     Micayla Brathwaite [name]

## 2016-06-09 NOTE — Discharge Instructions (Signed)
Apply ice packs to the area frequently for the next 3 days.  Leave bandage on for 5 days and then remove it.  Leave Steri-Strips on wound and let them fall off by themselves.  Light activities with the right arm for 1 week then may resume normal activities as long as there is no discomfort or pain.  May return to light work (desk type work) in 3-7 days when comfortable. Full duty in 1-2 weeks when pain-free.  Follow-up appointment in 2-3 weeks. Please call the office protheses [phone]) to make this appointment.  Call for heavy bleeding, signs of infection or other wound problems.  It is not unusual to have some numbness in the arm after this procedure.

## 2016-06-09 NOTE — Anesthesia Postprocedure Evaluation (Signed)
Anesthesia Post Note  Patient: Whitney Santiago  Procedure(s) Performed: Procedure(s) (LRB): AXILLARY LYMPH NODE BIOPSY (Right)  Patient location during evaluation: PACU Anesthesia Type: General Pain management: pain level controlled Vital Signs Assessment: post-procedure vital signs reviewed and stable Respiratory status: spontaneous breathing Cardiovascular status: stable Anesthetic complications: no    Last Vitals:  Vitals:   06/09/16 1415 06/09/16 1430  BP:  126/61  Pulse:  63  Resp:  16  Temp: 36.7 C     Last Pain:  Vitals:   06/09/16 1430  TempSrc:   PainSc: 1                  EDWARDS,Daziah Hesler

## 2016-06-09 NOTE — Transfer of Care (Signed)
Immediate Anesthesia Transfer of Care Note  Patient: Whitney Santiago  Procedure(s) Performed: Procedure(s): AXILLARY LYMPH NODE BIOPSY (Right)  Patient Location: PACU  Anesthesia Type:General  Level of Consciousness: awake, alert , oriented and patient cooperative  Airway & Oxygen Therapy: Patient Spontanous Breathing and Patient connected to nasal cannula oxygen  Post-op Assessment: Report given to RN and Post -op Vital signs reviewed and stable  Post vital signs: Reviewed and stable  Last Vitals:  Vitals:   06/09/16 0849 06/09/16 1244  BP: 129/84 (P) 127/74  Pulse: 82 (P) 67  Resp: 18   Temp: 36.6 C (P) 36.6 C    Last Pain:  Vitals:   06/09/16 0849  TempSrc: Oral      Patients Stated Pain Goal: 3 ([id number])  Complications: No apparent anesthesia complications

## 2016-06-09 NOTE — Op Note (Signed)
Operative Note  Whitney Santiago female 55 y.o. 06/09/2016  PREOPERATIVE DX:  Right axillary lymphadenopathy in a patient with a history of Hodgkin's lymphoma  POSTOPERATIVE DX:  Same  PROCEDURE:   Deep right axillary lymph node biopsy         Surgeon: Odis Hollingshead   Assistants: Armandina Gemma M.D.  Anesthesia: General LMA anesthesia  Indications:   This is a 55 year old female with a history of Hoskins lymphoma. She felt a fullness in her right axillary area. PET scan demonstrated an area of significant hypermetabolic activity. The PET scan was done in March 2016. She was lost to follow-up but then returned in July 2017, referred by her oncologist, for a right axillary lymph node biopsy. She now presents for that.  Note:  Has of her size, the operation was difficult and I initially had difficulty finding the enlarged lymph nodes. Dr. Harlow Asa came in and assisted me and help me find the lymph nodes.    Procedure Detail:  She was seen in the holding area in the right axilla marked with my initials. She is brought to the operating room, placed supine on the operating table, and a general anesthetic was given. The right axillary area was then sterilely prepped and draped. A timeout was performed.  Marcaine was infiltrated in the skin and dermis of the right axillary area. A transverse incision was made through the skin and subcutaneous tissue. The subcutaneous tissue was dense and deep. I then entered the axillary contents and identified the chest wall. I felt small shotty areas but these turned out just to be firm fat nodules. I identified the long thoracic nerve of Bell as well as intercostal brachiocutaneous nerves and preserve them. I was unable to definitively identify enlarged lymph nodes as were seen on the PET scan. I requested that Dr. Harlow Asa come in and assist. We were then able to expose and get into the deeper right axilla and enlarged lymph nodes were noted. These were dissected  out bluntly and then lymphatics were clipped and the lymph nodes were removed. There were approximately 3 lymph nodes removed. These were sent fresh to pathology.  I then inspected the wound. Hemostasis was adequate. The subcutaneous tissues and closed with interrupted 3-0 Vicryl sutures. The skin is closed with a running 4-0 Monocryl subcuticular stitch. Steri-Strips and sterile dressing were applied.  She tolerated the procedure without any apparent complications. She was taken to the recovery room in satisfactory condition.  Estimated Blood Loss:  150 ml                Specimens: Enlarged right axillary lymph nodes.        Complications:  * No complications entered in OR log *         Disposition: PACU - hemodynamically stable.         Condition: stable

## 2016-06-09 NOTE — H&P (Signed)
History of Present Illness  The patient is a 55 year old female.   Note:She is well known to me. She presented in April 2016 for a right axillary lymph node biopsy. She has a history of Hodgkin's lymphoma and had some hypermetabolic adenopathy in both of the axilla areas. She had a palpable lymph node in the right axilla. We had scheduled her for the biopsy but she did not have it done has been lost to follow-up until now. She states she had a major life change in the interim.  She tried to call and get an appointment with her oncologist, Dr.Dr. Osker Mason, and was told that she needed to  get the biopsy first before he could see her as he did not have enough information without the biopsy. She does presents for that. No night sweats. No weight loss. She feels the enlarged lymph node is still present. She now presents for elective surgery.  Problem List/Past Medical  LYMPHADENOPATHY, AXILLARY (R59.0)  Other Problems  High blood pressure Hodgkins Lymphoma  Past Surgical History  Breast Biopsy Left. Colon Polyp Removal - Colonoscopy  Allergies  HYDROcodone Bitartrate *CHEMICALS* Hives. OxyCODONE HCl (Abuse Deter) *ANALGESICS - OPIOID* Hives.  Prior to Admission medications   Medication Sig Start Date End Date Taking? Authorizing Provider  Calcium Carb-Cholecalciferol (CALCIUM + D3 PO) Take 1 tablet by mouth daily.    Yes Historical Provider, MD  Cholecalciferol (VITAMIN D3) 2000 UNITS TABS Take 2,000 Units by mouth daily.    Yes Historical Provider, MD  hydrochlorothiazide (HYDRODIURIL) 25 MG tablet TAKE 1 TABLET (25 MG TOTAL) BY MOUTH DAILY. 05/09/16  Yes Lucretia Kern, DO  lisinopril (PRINIVIL,ZESTRIL) 5 MG tablet TAKE 1 TABLET (5 MG TOTAL) BY MOUTH DAILY. 05/09/16  Yes Lucretia Kern, DO  potassium chloride (K-DUR) 10 MEQ tablet Take 1 tablet (10 mEq total) by mouth 2 (two) times daily. 05/17/16  Yes Salvadore Dom, MD     Social History  Alcohol use  Occasional alcohol use. Caffeine use Coffee, Tea. No drug use Tobacco use Never smoker.  Family History  Cerebrovascular Accident Mother. Diabetes Mellitus Father. Hypertension Brother, Daughter, Mother, Sister.  Pregnancy / Birth History  Age at menarche 56 years. Age of menopause <47 Gravida 2 Maternal age 110-20 Para 2   Review of Systems Odis Hollingshead MD; 04/13/2016 11:33 AM)  Note: No night sweats. No dyspnea. No weight loss.    Physical Exam   The physical exam findings are as follows: Note:General: Morbidly obese female in NAD. Pleasant and cooperative.  CV: RRR, no murmur, no edema  CHEST: Breath sounds equal and clear. Respirations nonlabored.  BREASTS: Symmetrical in size. No dominant masses, nipple discharge or suspicious skin lesions.  LYMPHATIC: Palpable right axillary adenopathy.   NEUROLOGIC: Alert and oriented, answers questions appropriately, normal gait and station.  PSYCHIATRIC: Normal mood, affect , and behavior.    Assessment & Plan  LYMPHADENOPATHY, AXILLARY (R59.0) Impression: She presents now to schedule her right axillary lymph node biopsy. She has a history of Hodgkin's lymphoma.  Right axillary lymph node biopsy as an outpatient. The procedure risks and after care were discussed with her. Risks include but are not limited to bleeding, wound infection, wound healing problem's, nerve damage and weakness or numbness, anesthesia. She seems to understand all this and would like to proceed.  Jackolyn Confer, MD

## 2016-06-10 ENCOUNTER — Encounter (HOSPITAL_COMMUNITY): Payer: Self-pay | Admitting: General Surgery

## 2016-06-13 ENCOUNTER — Ambulatory Visit: Payer: BLUE CROSS/BLUE SHIELD | Admitting: Obstetrics and Gynecology

## 2016-06-14 ENCOUNTER — Ambulatory Visit (HOSPITAL_BASED_OUTPATIENT_CLINIC_OR_DEPARTMENT_OTHER): Payer: BLUE CROSS/BLUE SHIELD | Admitting: Oncology

## 2016-06-14 ENCOUNTER — Telehealth: Payer: Self-pay | Admitting: Internal Medicine

## 2016-06-14 VITALS — BP 129/73 | HR 76 | Temp 97.6°F | Resp 20 | Ht 63.0 in | Wt 229.6 lb

## 2016-06-14 DIAGNOSIS — R599 Enlarged lymph nodes, unspecified: Secondary | ICD-10-CM

## 2016-06-14 DIAGNOSIS — Z8572 Personal history of non-Hodgkin lymphomas: Secondary | ICD-10-CM

## 2016-06-14 DIAGNOSIS — D649 Anemia, unspecified: Secondary | ICD-10-CM | POA: Diagnosis not present

## 2016-06-14 DIAGNOSIS — C8103 Nodular lymphocyte predominant Hodgkin lymphoma, intra-abdominal lymph nodes: Secondary | ICD-10-CM

## 2016-06-14 NOTE — Telephone Encounter (Signed)
Lvm advising appts 10/5 + 10/9. Advised radiology will call with scan appt.

## 2016-06-14 NOTE — Progress Notes (Signed)
Hematology and Oncology Follow Up Visit  Whitney Santiago [id number] 30-Apr-1961 55 y.o. 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 She presented with inguinal adenopathy. She was treated with systemic chemotherapy and hhe has been in remission since year 2001.  She developed incidental axillary lymphadenopathy in 2015.   Prior Therapy: She was treated at [facility] with rituximab concomitantly with chlorambucil orally. She appeared to have a good response and remained disease free between 2001 and 2015.  Current therapy: Has been under observation and surveillance but currently under evaluation axillary adenopathy detected on her shoulder MRI in October 2015.  Interim History: Whitney Santiago presents today for a follow-up visit. She is a pleasant woman I saw in follow-up back in March 2016 for possible recurrent lymphoma. I recommended PET/CT scan as well as pathological sampling and without lymph gland. Patient was lost to follow-up the last year and half but reestablished care with her primary care provider and was referred to Gen. surgery. She had lymph node biopsy done on 06/09/2016 and the results are currently pending.  She was also noted to have a mild anemia with a hemoglobin of 11.8. Her hemoglobin has ranged close to 12 previously. Clinically she feels reasonably well without any recent complaints. She does have some pain and numbness associated with her right axillary lymph node surgery but otherwise no other complaints. She denies any fevers or chills or sweats. Denied any appetite changes or any lymphadenopathy.   She does not report any headaches blurry vision, double vision or syncope. She does not report any fevers, chills, sweats, weight loss, excessive fatigue or tiredness. She has not reported any abdominal distention or early satiety. She did not  report any chest pain, palpitation, orthopnea, edema. She does not report any shortness of breath, dyspnea on exertion, wheezing or cough. She does not report any nausea, vomiting, hematochezia, melena, constipation or diarrhea. She reported frequency, urgency, hesitancy does not report any skeletal complaints. Rest of her review of systems unremarkable.   Medications: I have reviewed the patient's current medications.  Current Outpatient Prescriptions  Medication Sig Dispense Refill  . Calcium Carb-Cholecalciferol (CALCIUM + D3 PO) Take 1 tablet by mouth daily.     . Cholecalciferol (VITAMIN D3) 2000 UNITS TABS Take 2,000 Units by mouth daily.     . hydrochlorothiazide (HYDRODIURIL) 25 MG tablet TAKE 1 TABLET (25 MG TOTAL) BY MOUTH DAILY. 90 tablet 1  . lisinopril (PRINIVIL,ZESTRIL) 5 MG tablet TAKE 1 TABLET (5 MG TOTAL) BY MOUTH DAILY. 90 tablet 1  . traMADol (ULTRAM) 50 MG tablet Take 1-2 tablets (50-100 mg total) by mouth every 4 (four) hours as needed for moderate pain or severe pain. 40 tablet 0   No current facility-administered medications for this visit.      Allergies:  Allergies  Allergen Reactions  . Hydrocodone Hives  . Oxycodone Hives    Past Medical History, Surgical history, Social history, and Family History were reviewed and updated.   Physical Exam: Blood pressure 129/73, pulse 76, temperature 97.6 F (36.4 C), temperature source Oral, resp. rate 20, height 5\' 3"  (1.6 m), weight 229 lb 9.6 oz (104.1 kg), last menstrual period 10/19/1998, SpO2 100 %. ECOG: 0 General appearance: alert and cooperative appeared without distress. Head: Normocephalic, without obvious abnormality Neck: no adenopathy Lymph nodes: Cervical, supraclavicular, and axillary nodes normal. Heart: Regular rate and rhythm. No murmurs rubs or  gallops. Lung: No rhonchi, wheezes, or dullness to percussion. Clear throughout the lung fields. Abdomin: soft, non-tender, without masses or organomegaly  without shifting dullness or ascites. EXT:no erythema, induration, or nodules   Lab Results: Lab Results  Component Value Date   WBC 10.2 06/01/2016   HGB 11.8 (L) 06/01/2016   HCT 37.1 06/01/2016   MCV 86.3 06/01/2016   PLT 368 06/01/2016     Chemistry      Component Value Date/Time   NA 137 06/01/2016 0820   K 3.3 (L) 06/01/2016 0820   CL 102 06/01/2016 0820   CO2 26 06/01/2016 0820   BUN 13 06/01/2016 0820   CREATININE 0.87 06/01/2016 0820      Component Value Date/Time   CALCIUM 9.0 06/01/2016 0820   ALKPHOS 85 09/05/2014 1224   AST 17 09/05/2014 1224   ALT 13 09/05/2014 1224   BILITOT 0.5 09/05/2014 1224       Radiological Studies: CLINICAL DATA: Subsequent treatment strategy for Hodgkin's lymphoma diagnosed in 2000 with subsequent remission. Recent left axillary lymphadenopathy on shoulder MRI. Initial encounter.  EXAM: NUCLEAR MEDICINE PET SKULL BASE TO THIGH  TECHNIQUE: 11.3 mCi F-18 FDG was injected intravenously. Full-ring PET imaging was performed from the skull base to thigh after the radiotracer. CT data was obtained and used for attenuation correction and anatomic localization.  FASTING BLOOD GLUCOSE: Value: 93 mg/dl  COMPARISON: Left shoulder MRI 07/12/2014.  FINDINGS: NECK  No hypermetabolic cervical lymph nodes are identified.There is nonspecific hypermetabolic activity within [facility] ring without demonstrated asymmetry, probably physiologic.  CHEST  Hypermetabolic axillary lymph nodes are present bilaterally. These are larger and more numerous on the right, measuring 1.8 x 2.6 cm on image 54 and 2.0 x 2.5 cm on image 57. Right axillary nodes demonstrate an SUV max of up to 14.0. On the left, there is a 1.4 x 2.1 cm node on image 61 with an SUV max of 8.4. There is no hypermetabolic mediastinal or hilar adenopathy. No abnormal pulmonary activity or suspicious pulmonary nodule.  ABDOMEN/PELVIS  There is no  hypermetabolic activity within the liver, adrenal glands, spleen or pancreas. There is no hypermetabolic nodal activity. The spleen is normal in size. Globular enlargement of the uterus is noted, likely representing fibroids or adenomyosis. No associated abnormal metabolic activity.  SKELETON  There is no hypermetabolic activity to suggest osseous metastatic disease.  IMPRESSION: 1. Bilateral axillary hypermetabolic adenopathy, more extensive on the right. Appearance is worrisome for recurrent lymphoma. Tissue sampling recommended. 2. No other hypermetabolic nodal activity within the neck, chest, abdomen or pelvis. Nonspecific activity within the lymphoid tissue of Waldeyer's ring. 3. Uterine fibroids or adenomyosis.   Impression and Plan:   55 year old woman with the following issues:  1. History of lymphocyte predominant Hodgkin's lymphoma diagnosed in 2000. At that time she presented with inguinal adenopathy although the exact staging at that time is not available to me. She was [facility] at [facility] with rituximab concomitantly with chlorambucil orally. She appeared to have a good response and remained disease free between 2001 and 2015.  CT scan obtained in March 2016 was reviewed again which showed bilateral axillary hypermetabolic adenopathy more on the right than the left. She was lost to follow-up for the last year and a half but subsequently underwent lymph node sampling of the right axillary region. The pathology is currently pending.  The differential diagnosis was discussed again with the patient as well as therapeutic options. Recurrence of her lymphoma is  certainly possibility as well as transformation into aggressive lymphoma is also possible. I think this is less likely but needs to be ruled out. Progressive transformation of germinal centers is also a common feature of this disease.  From a management standpoint, we will await the results of  the lymph node biopsy and I have recommended repeating imaging study since her last scan was a year and a half ago. Depending on these results we will determine the treatment. That could be in the form of aggressive multiagent systemic chemotherapy if she has high-grade transformation. He also be monoclonal antibody rituximab as a single agent or radiation therapy. It could also be a benign process and no treatment will be needed at that time. She'll have a quick follow-up after the completion of her CT scan as well as pathology results.  2. Mild anemia with a hemoglobin of 11.8. Could be related to her lymphoma recurrence versus reactive findings. We will continue to monitor moving forward. I see no evidence to suggest and new hematological disorder.  2. Follow-up: Will be the next 2 weeks to give the final recommendation.  [facility], MD 9/26/20178:57 AM

## 2016-06-15 ENCOUNTER — Ambulatory Visit: Payer: BLUE CROSS/BLUE SHIELD | Admitting: Oncology

## 2016-06-15 ENCOUNTER — Ambulatory Visit (INDEPENDENT_AMBULATORY_CARE_PROVIDER_SITE_OTHER): Payer: BLUE CROSS/BLUE SHIELD | Admitting: Obstetrics and Gynecology

## 2016-06-15 ENCOUNTER — Encounter: Payer: Self-pay | Admitting: Obstetrics and Gynecology

## 2016-06-15 VITALS — BP 138/92 | HR 80 | Resp 14 | Wt 229.0 lb

## 2016-06-15 DIAGNOSIS — N819 Female genital prolapse, unspecified: Secondary | ICD-10-CM | POA: Diagnosis not present

## 2016-06-15 DIAGNOSIS — Z9889 Other specified postprocedural states: Secondary | ICD-10-CM | POA: Diagnosis not present

## 2016-06-15 NOTE — Progress Notes (Signed)
GYNECOLOGY  VISIT   HPI: 55 y.o.   Divorced  [place] American  female   [phone] with Patient's last menstrual period was 10/19/1998 (approximate).   here 22 days s/p D&C hysteroscopy, polypectomy for PMP bleeding. Pathology was all benign. She has recovered well. No c/o. She has had a few days of spotting.  She was noted to have genital prolapse at the time of surgery. She denies symptoms of a bulge, no urinary c/o, no frequency, no leakage, emptying well.  She has a h/o Hodgkins Lymphoma and is s/p lymph node biopsy last week. Being evaluated for recurrent disease.   GYNECOLOGIC HISTORY: Patient's last menstrual period was 10/19/1998 (approximate). Contraception:postmenopause Menopausal hormone therapy: none         OB History    Gravida Para Term Preterm AB Living   2 2 2     2    SAB TAB Ectopic Multiple Live Births           2         Patient Active Problem List   Diagnosis Date Noted  . MDD (major depressive disorder) ([place]) 06/19/2015  . Hyperglycemia 06/19/2015  . LAD (lymphadenopathy), axillary 06/19/2015  . Essential hypertension 01/09/2015  . BMI 39.0-39.9,adult 01/09/2015  . Hyperlipemia 01/09/2015  . Left shoulder pain - managed by [facility] 06/26/2014  . Hodgkin disease, Hx of 2000, treated at [facility] 06/26/2014    Past Medical History:  Diagnosis Date  . Allergy   . Anxiety    no med  . Depression    no med  . Fibroid   . History of migraine    Hx - last migraine 2 yrs ago, no longer a problem  . Hodgkin disease ([place]) 1999   lymphocytic predominant Hodkin's 2000, s/p 4 weeks Rituxan, chlorambucil 10/21/99-04/19/00  . Hx of iron deficiency anemia    Hx -per [facility] notes, since childhood, possible thalassemia  . Hyperglycemia   . Hyperlipemia    no med  . Hypertension   . LAD (lymphadenopathy), axillary    eval with onc and [facility] 2016  . Obesity   . STD (sexually transmitted disease)    gonorrhea about 30 years ago  . SVD (spontaneous vaginal delivery)     x 2    Past Surgical History:  Procedure Laterality Date  . AXILLARY LYMPH NODE BIOPSY Right 06/09/2016   Procedure: AXILLARY LYMPH NODE BIOPSY;  Surgeon: Jackolyn Confer, MD;  Location: [facility];  Service: General;  Laterality: Right;  . BREAST SURGERY Left    due to bloody discharge  . COLONOSCOPY    . DILATATION & CURETTAGE/HYSTEROSCOPY WITH MYOSURE N/A 05/24/2016   Procedure: DILATATION & CURETTAGE/HYSTEROSCOPY WITH MYOSURE;  Surgeon: Salvadore Dom, MD;  Location: [facility];  Service: Gynecology;  Laterality: N/A;  . laparascop    . LAPAROSCOPIC REMOVAL ABDOMINAL MASS  2000   LLQ    Current Outpatient Prescriptions  Medication Sig Dispense Refill  . Calcium Carb-Cholecalciferol (CALCIUM + D3 PO) Take 1 tablet by mouth daily.     . Cholecalciferol (VITAMIN D3) 2000 UNITS TABS Take 2,000 Units by mouth daily.     . hydrochlorothiazide (HYDRODIURIL) 25 MG tablet TAKE 1 TABLET (25 MG TOTAL) BY MOUTH DAILY. 90 tablet 1  . lisinopril (PRINIVIL,ZESTRIL) 5 MG tablet TAKE 1 TABLET (5 MG TOTAL) BY MOUTH DAILY. 90 tablet 1  . traMADol (ULTRAM) 50 MG tablet Take 1-2 tablets (50-100 mg total) by mouth every 4 (four) hours as needed for moderate  pain or severe pain. 40 tablet 0   No current facility-administered medications for this visit.      ALLERGIES: Hydrocodone and Oxycodone  Family History  Problem Relation Age of Onset  . Hypertension Mother   . Stroke Mother   . Hypertension Father   . Diabetes Father   . Hypertension Sister   . Hypertension Brother   . Hypertension Daughter     Social History   Social History  . Marital status: Divorced    Spouse name: N/A  . Number of children: N/A  . Years of education: N/A   Occupational History  . Not on file.   Social History Main Topics  . Smoking status: Never Smoker  . Smokeless tobacco: Never Used  . Alcohol use No  . Drug use: No  . Sexual activity: Yes    Partners: Male    Birth control/ protection: None   Other  Topics Concern  . 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   HENT: Negative.   Eyes: Negative.   Respiratory: Negative.   Cardiovascular: Negative.   Gastrointestinal: Negative.   Genitourinary: Negative.   Musculoskeletal: Negative.   Skin: Negative.   Neurological: Negative.   Endo/Heme/Allergies: Negative.   Psychiatric/Behavioral: Negative.     PHYSICAL EXAMINATION:    BP (!) 138/92 (BP Location: Left Arm, Patient Position: Sitting, Cuff Size: Large)   Pulse 80   Resp 14   Wt 229 lb (103.9 kg)   LMP 10/19/1998 (Approximate)   BMI 40.57 kg/m     General appearance: alert, cooperative and appears stated age Abdomen: soft, non-tender; bowel sounds normal; no masses,  no organomegaly   ASSESSMENT S/P hysteroscopy, polypectomy, benign pathology Genital prolapse noted at the time of surgery, not symptomatic    PLAN Routine f/u Call with any further bleeding   An After Visit Summary was printed and given to the patient.  CC: Dr Colin Benton

## 2016-06-16 ENCOUNTER — Encounter: Payer: Self-pay | Admitting: General Surgery

## 2016-06-16 NOTE — Progress Notes (Signed)
Her pathology is consistent with Hodgkin's disease and this was discussed with her.  She has an appointment with Dr. Alen Blew on 06/27/16.

## 2016-06-17 ENCOUNTER — Telehealth: Payer: Self-pay | Admitting: Family Medicine

## 2016-06-17 DIAGNOSIS — C8590 Non-Hodgkin lymphoma, unspecified, unspecified site: Secondary | ICD-10-CM

## 2016-06-17 NOTE — Telephone Encounter (Signed)
Please call pt to see how we can help her. Appears is seeing oncologist here. Thank you.

## 2016-06-17 NOTE — Telephone Encounter (Signed)
I called the pt and informed her of the message below and she is aware the referral was entered and someone will call with appt info. 

## 2016-06-17 NOTE — Telephone Encounter (Signed)
I called the pt and she stated the surgeon here told her she had cancer and the oncologist here told her she did not have cancer?  Patient states she feels the oncologist here just brushed her off and she would feel better going back to [facility] as this is where she was seen initially?  Message sent to Dr [name].

## 2016-06-17 NOTE — Addendum Note (Signed)
Addended by: Agnes Lawrence on: 06/17/2016 05:10 PM   Modules accepted: Orders

## 2016-06-17 NOTE — Telephone Encounter (Signed)
Pleas let pt know I am so sorry to hear and wish to help in any way possible. Ok to place referral. Did she want referral to a particular person or general referral to [facility]? Ok to place per patient preference for lymphoma. Thanks.

## 2016-06-17 NOTE — Telephone Encounter (Signed)
Pt need to have a referral [facility]  [phone] (for oncology).  Pt would also like a call back.

## 2016-06-21 ENCOUNTER — Telehealth: Payer: Self-pay | Admitting: Family Medicine

## 2016-06-21 NOTE — Telephone Encounter (Signed)
I called the pt and informed her of the message below and she agreed to call Dr Barbaraann Faster.

## 2016-06-21 NOTE — Telephone Encounter (Signed)
Pt is going to have a CT on Thursday and she is claustrophobic. Pharm:  [facility] on [address].

## 2016-06-21 NOTE — Telephone Encounter (Signed)
Please advise she contact provider whom ordered the CT - they may be able to assist her with medication for the procedure. Thanks.

## 2016-06-22 ENCOUNTER — Telehealth: Payer: Self-pay | Admitting: *Deleted

## 2016-06-22 ENCOUNTER — Other Ambulatory Visit: Payer: Self-pay | Admitting: *Deleted

## 2016-06-22 DIAGNOSIS — C8103 Nodular lymphocyte predominant Hodgkin lymphoma, intra-abdominal lymph nodes: Secondary | ICD-10-CM

## 2016-06-22 NOTE — Telephone Encounter (Signed)
"  This is Manuela Schwartz from [facility] scheduling.  I ned to talk with Dr. Hazeline Junker nurse.  This patient needs order changed tomorrow to a PET skull base to thigh, restage.  This may need a new prior authorization."  Call transferred ext [phone].

## 2016-06-23 ENCOUNTER — Ambulatory Visit (HOSPITAL_COMMUNITY): Payer: BLUE CROSS/BLUE SHIELD

## 2016-06-23 ENCOUNTER — Encounter (HOSPITAL_COMMUNITY)
Admission: RE | Admit: 2016-06-23 | Discharge: 2016-06-23 | Disposition: A | Discharge status: Home / Self Care | Payer: BLUE CROSS/BLUE SHIELD | Source: Ambulatory Visit | Attending: Oncology | Admitting: Oncology

## 2016-06-23 ENCOUNTER — Other Ambulatory Visit (HOSPITAL_BASED_OUTPATIENT_CLINIC_OR_DEPARTMENT_OTHER): Payer: BLUE CROSS/BLUE SHIELD

## 2016-06-23 DIAGNOSIS — C8103 Nodular lymphocyte predominant Hodgkin lymphoma, intra-abdominal lymph nodes: Secondary | ICD-10-CM

## 2016-06-23 DIAGNOSIS — C8590 Non-Hodgkin lymphoma, unspecified, unspecified site: Secondary | ICD-10-CM | POA: Diagnosis not present

## 2016-06-23 LAB — COMPREHENSIVE METABOLIC PANEL
ALK PHOS: 91 U/L (ref 40–150)
ALT: 15 U/L (ref 0–55)
ANION GAP: 10 meq/L (ref 3–11)
AST: 17 U/L (ref 5–34)
Albumin: 3.6 g/dL (ref 3.5–5.0)
BUN: 12.2 mg/dL (ref 7.0–26.0)
CALCIUM: 10.2 mg/dL (ref 8.4–10.4)
CHLORIDE: 102 meq/L (ref 98–109)
CO2: 28 mEq/L (ref 22–29)
Creatinine: 0.9 mg/dL (ref 0.6–1.1)
EGFR: 82 mL/min/{1.73_m2} — AB (ref >90)
Glucose: 127 mg/dl (ref 70–140)
POTASSIUM: 3.8 meq/L (ref 3.5–5.1)
Sodium: 140 mEq/L (ref 136–145)
Total Bilirubin: 0.38 mg/dL (ref 0.20–1.20)
Total Protein: 7.8 g/dL (ref 6.4–8.3)

## 2016-06-23 LAB — CBC WITH DIFFERENTIAL/PLATELET
BASO%: 0.4 % (ref 0.0–2.0)
BASOS ABS: 0 10*3/uL (ref 0.0–0.1)
EOS ABS: 0.1 10*3/uL (ref 0.0–0.5)
EOS%: 1 % (ref 0.0–7.0)
HEMATOCRIT: 37.6 % (ref 34.8–46.6)
HGB: 12.2 g/dL (ref 11.6–15.9)
LYMPH%: 30.8 % (ref 14.0–49.7)
MCH: 27.7 pg (ref 25.1–34.0)
MCHC: 32.6 g/dL (ref 31.5–36.0)
MCV: 85 fL (ref 79.5–101.0)
MONO#: 0.5 10*3/uL (ref 0.1–0.9)
MONO%: 6.3 % (ref 0.0–14.0)
NEUT#: 5.3 10*3/uL (ref 1.5–6.5)
NEUT%: 61.5 % (ref 38.4–76.8)
Platelets: 418 10*3/uL — ABNORMAL HIGH (ref 145–400)
RBC: 4.42 10*6/uL (ref 3.70–5.45)
RDW: 14.5 % (ref 11.2–14.5)
WBC: 8.6 10*3/uL (ref 3.9–10.3)
lymph#: 2.6 10*3/uL (ref 0.9–3.3)

## 2016-06-23 LAB — GLUCOSE, CAPILLARY: GLUCOSE-CAPILLARY: 136 mg/dL — AB (ref 65–99)

## 2016-06-23 MED ORDER — FLUDEOXYGLUCOSE F - 18 (FDG) INJECTION
11.2100 | Freq: Once | INTRAVENOUS | Status: AC | PRN
  Administered 2016-06-23: 11.21 via INTRAVENOUS

## 2016-06-24 ENCOUNTER — Telehealth: Payer: Self-pay | Admitting: Oncology

## 2016-06-24 NOTE — Telephone Encounter (Signed)
10/09 Appointment canceled per patient request.

## 2016-06-27 ENCOUNTER — Ambulatory Visit: Payer: BLUE CROSS/BLUE SHIELD | Admitting: Oncology

## 2016-06-29 DIAGNOSIS — R590 Localized enlarged lymph nodes: Secondary | ICD-10-CM | POA: Diagnosis not present

## 2016-06-29 DIAGNOSIS — C8104 Nodular lymphocyte predominant Hodgkin lymphoma, lymph nodes of axilla and upper limb: Secondary | ICD-10-CM | POA: Diagnosis not present

## 2016-07-04 DIAGNOSIS — C8104 Nodular lymphocyte predominant Hodgkin lymphoma, lymph nodes of axilla and upper limb: Secondary | ICD-10-CM | POA: Diagnosis not present

## 2016-07-04 DIAGNOSIS — C8100 Nodular lymphocyte predominant Hodgkin lymphoma, unspecified site: Secondary | ICD-10-CM | POA: Diagnosis not present

## 2016-07-04 NOTE — Progress Notes (Signed)
HPI:  Whitney Santiago is a pleasant 55 yo with PMH listed below here for follow up. She unfortunately has had recurrence of lymphoma per review specialists notes and has been seeing oncology here, but now has transferred care to [facility] and is currently deciding on a treatment plan. She reports she just saw the radiation specialist was very happy with her care. She denies weight loss, fevers, night sweats or any symptoms related to this. She see gyn for women's health isues. Her blood pressure is stable on lisinopril and hctz. She also has prediabetes, obesity and hyperlipidemia. We provided her with information regarding a healthy Mediterranean style diet and regular aerobic exercise for this. She reports that she has not really been working on the diet or exercise but is interested in doing so. She would like to check her labs next time when she has had a chance to work on the lifestyle. Denies chest pain, shortness of breath, vision changes or history of statin intolerance.  ROS: See pertinent positives and negatives per HPI.  Past Medical History:  Diagnosis Date  . Allergy   . Anxiety    no med  . Depression    no med  . Fibroid   . History of migraine    Hx - last migraine 2 yrs ago, no longer a problem  . Hodgkin disease ([place]) 1999   lymphocytic predominant Hodkin's 2000, s/p 4 weeks Rituxan, chlorambucil 10/21/99-04/19/00  . Hx of iron deficiency anemia    Hx -per [facility] notes, since childhood, possible thalassemia  . Hyperglycemia   . Hyperlipemia    no med  . Hypertension   . LAD (lymphadenopathy), axillary    eval with onc and [facility] 2016  . Obesity   . STD (sexually transmitted disease)    gonorrhea about 30 years ago  . SVD (spontaneous vaginal delivery)    x 2    Past Surgical History:  Procedure Laterality Date  . AXILLARY LYMPH NODE BIOPSY Right 06/09/2016   Procedure: AXILLARY LYMPH NODE BIOPSY;  Surgeon: Jackolyn Confer, MD;  Location: [facility];  Service: General;   Laterality: Right;  . BREAST SURGERY Left    due to bloody discharge  . COLONOSCOPY    . DILATATION & CURETTAGE/HYSTEROSCOPY WITH MYOSURE N/A 05/24/2016   Procedure: DILATATION & CURETTAGE/HYSTEROSCOPY WITH MYOSURE;  Surgeon: Salvadore Dom, MD;  Location: [facility];  Service: Gynecology;  Laterality: N/A;  . laparascop    . LAPAROSCOPIC REMOVAL ABDOMINAL MASS  2000   LLQ    Family History  Problem Relation Age of Onset  . Hypertension Mother   . Stroke Mother   . Hypertension Father   . Diabetes Father   . Hypertension Sister   . Hypertension Brother   . Hypertension Daughter     Social History   Social History  . Marital status: Divorced    Spouse name: N/A  . Number of children: N/A  . Years of education: N/A   Social History Main Topics  . Smoking status: Never Smoker  . Smokeless tobacco: Never Used  . Alcohol use No  . Drug use: No  . Sexual activity: Yes    Partners: Male    Birth control/ protection: None   Other Topics Concern  . 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  Calcium Carb-Cholecalciferol (CALCIUM + D3 PO), Take 1 tablet by mouth daily. , Disp: , Rfl:  .  Cholecalciferol (VITAMIN D3) 2000 UNITS TABS, Take 2,000 Units by mouth daily. , Disp: , Rfl:  .  hydrochlorothiazide (HYDRODIURIL) 25 MG tablet, TAKE 1 TABLET (25 MG TOTAL) BY MOUTH DAILY., Disp: 90 tablet, Rfl: 1 .  lisinopril (PRINIVIL,ZESTRIL) 5 MG tablet, TAKE 1 TABLET (5 MG TOTAL) BY MOUTH DAILY., Disp: 90 tablet, Rfl: 1  EXAM:  Vitals:   07/05/16 0849  BP: 118/80  Pulse: 84  Temp: 98.8 F (37.1 C)    Body mass index is 39.77 kg/m.  GENERAL: vitals reviewed and listed above, alert, oriented, appears well hydrated and in no acute  distress  HEENT: atraumatic, conjunttiva clear, no obvious abnormalities on inspection of external nose and ears  NECK: no obvious masses on inspection  LUNGS: clear to auscultation bilaterally, no wheezes, rales or rhonchi, good air movement  CV: HRRR, no peripheral edema  MS: moves all extremities without noticeable abnormality  PSYCH: pleasant and cooperative, no obvious depression or anxiety  ASSESSMENT AND PLAN:  Discussed the following assessment and plan:  Essential hypertension -cont current medications  BMI 39.0-39.9,adult Hyperlipidemia, unspecified hyperlipidemia type Hyperglycemia -lifestyle recommendations - med diet advised -labs next visit  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  Patient Instructions  BEFORE YOU LEAVE: -flu shot -follow up: in 3 months, please come fasting to recheck labs - drink plenty of water   We recommend the following healthy lifestyle for LIFE: 1) Small portions.   Tip: eat off of a salad plate instead of a dinner plate.  Tip: if you need more or a snack choose fruits, veggies and/or a handful of nuts or seeds.  2) Eat a healthy clean diet.  * Tip: Avoid (less then 1 serving per week): processed foods, sweets, sweetened drinks, white starches (rice, flour, bread, potatoes, pasta, etc), red meat, fast foods, butter  *Tip: CHOOSE instead   * 5-9 servings per day of fresh or frozen fruits and vegetables (but not corn, potatoes, bananas, canned or dried fruit)   *nuts and seeds, beans   *olives and olive oil   *small portions of lean meats such as fish and white chicken    *small portions of whole grains  3)Get at least 150 minutes of sweaty aerobic exercise per week.  4)Reduce stress - consider counseling, meditation and relaxation  to balance other aspects of your life. e    Lucretia Kern., DO

## 2016-07-05 ENCOUNTER — Telehealth: Payer: Self-pay | Admitting: Family Medicine

## 2016-07-05 ENCOUNTER — Encounter: Payer: Self-pay | Admitting: Family Medicine

## 2016-07-05 ENCOUNTER — Ambulatory Visit (INDEPENDENT_AMBULATORY_CARE_PROVIDER_SITE_OTHER): Payer: BLUE CROSS/BLUE SHIELD | Admitting: Family Medicine

## 2016-07-05 VITALS — BP 118/80 | HR 84 | Temp 98.8°F | Ht 63.0 in | Wt 224.5 lb

## 2016-07-05 DIAGNOSIS — R739 Hyperglycemia, unspecified: Secondary | ICD-10-CM | POA: Diagnosis not present

## 2016-07-05 DIAGNOSIS — E785 Hyperlipidemia, unspecified: Secondary | ICD-10-CM | POA: Diagnosis not present

## 2016-07-05 DIAGNOSIS — Z6839 Body mass index (BMI) 39.0-39.9, adult: Secondary | ICD-10-CM | POA: Diagnosis not present

## 2016-07-05 DIAGNOSIS — C8103 Nodular lymphocyte predominant Hodgkin lymphoma, intra-abdominal lymph nodes: Secondary | ICD-10-CM

## 2016-07-05 DIAGNOSIS — Z23 Encounter for immunization: Secondary | ICD-10-CM | POA: Diagnosis not present

## 2016-07-05 DIAGNOSIS — I10 Essential (primary) hypertension: Secondary | ICD-10-CM | POA: Diagnosis not present

## 2016-07-05 NOTE — Progress Notes (Signed)
Pre visit review using our clinic review tool, if applicable. No additional management support is needed unless otherwise documented below in the visit note. 

## 2016-07-05 NOTE — Patient Instructions (Addendum)
BEFORE YOU LEAVE: -flu shot -follow up: in 3 months, please come fasting to recheck labs - drink plenty of water   We recommend the following healthy lifestyle for LIFE: 1) Small portions.   Tip: eat off of a salad plate instead of a dinner plate.  Tip: if you need more or a snack choose fruits, veggies and/or a handful of nuts or seeds.  2) Eat a healthy clean diet.  * Tip: Avoid (less then 1 serving per week): processed foods, sweets, sweetened drinks, white starches (rice, flour, bread, potatoes, pasta, etc), red meat, fast foods, butter  *Tip: CHOOSE instead   * 5-9 servings per day of fresh or frozen fruits and vegetables (but not corn, potatoes, bananas, canned or dried fruit)   *nuts and seeds, beans   *olives and olive oil   *small portions of lean meats such as fish and white chicken    *small portions of whole grains  3)Get at least 150 minutes of sweaty aerobic exercise per week.  4)Reduce stress - consider counseling, meditation and relaxation to balance other aspects of your life. e

## 2016-07-05 NOTE — Telephone Encounter (Signed)
Pt would like a call back to discuss her lab results.

## 2016-09-01 ENCOUNTER — Ambulatory Visit: Payer: BLUE CROSS/BLUE SHIELD | Attending: General Surgery | Admitting: Physical Therapy

## 2016-09-01 DIAGNOSIS — M25611 Stiffness of right shoulder, not elsewhere classified: Secondary | ICD-10-CM

## 2016-09-01 DIAGNOSIS — M79601 Pain in right arm: Secondary | ICD-10-CM

## 2016-09-01 DIAGNOSIS — R293 Abnormal posture: Secondary | ICD-10-CM

## 2016-09-01 NOTE — Patient Instructions (Signed)
MEDIAN NERVE: Mobilization XI    Stand with right palm flat on wall, fingers back, elbow bent, head tilted away. Sidestep away from wall, straightening elbow. Do _1__ sets of _5__ repetitions per session. Do _3__ sessions per day.  Neurovascular: Ulnar Nerve Stretch - Supine    Lie with neck supported, right arm out to side, elbow bent to pain-free position, fingers pointing toward ear, palm up. Slowly bend elbow further, as far as possible without pain. Hold __5__ seconds. Repeat __5__ times per set. Do __1__ sets per session. Do __3__ sessions per week.  Copyright  VHI. All rights reserved.

## 2016-09-01 NOTE — Therapy (Signed)
[place], [place], [address] Phone: [phone]   Fax:  [phone]  Physical Therapy Evaluation  Patient Details  Name: Whitney Santiago MRN: [id number] Date of Birth: 08-Nov-1960 Referring Provider: Dr. Jackolyn Confer  Encounter Date: 09/01/2016      PT End of Session - 09/01/16 1147    Visit Number 1   Number of Visits 12   Date for PT Re-Evaluation 09/29/16   PT Start Time 0808   PT Stop Time 0848   PT Time Calculation (min) 40 min   Activity Tolerance Patient tolerated treatment well   Behavior During Therapy [facility] for tasks assessed/performed      Past Medical History:  Diagnosis Date  . Allergy   . Anxiety    no med  . Depression    no med  . Fibroid   . History of migraine    Hx - last migraine 2 yrs ago, no longer a problem  . Hodgkin disease ([place]) 1999   lymphocytic predominant Hodkin's 2000, s/p 4 weeks Rituxan, chlorambucil 10/21/99-04/19/00  . Hx of iron deficiency anemia    Hx -per [facility] notes, since childhood, possible thalassemia  . Hyperglycemia   . Hyperlipemia    no med  . Hypertension   . LAD (lymphadenopathy), axillary    eval with onc and [facility] 2016  . Obesity   . STD (sexually transmitted disease)    gonorrhea about 30 years ago  . SVD (spontaneous vaginal delivery)    x 2    Past Surgical History:  Procedure Laterality Date  . AXILLARY LYMPH NODE BIOPSY Right 06/09/2016   Procedure: AXILLARY LYMPH NODE BIOPSY;  Surgeon: Jackolyn Confer, MD;  Location: [place];  Service: General;  Laterality: Right;  . BREAST SURGERY Left    due to bloody discharge  . COLONOSCOPY    . DILATATION & CURETTAGE/HYSTEROSCOPY WITH MYOSURE N/A 05/24/2016   Procedure: DILATATION & CURETTAGE/HYSTEROSCOPY WITH MYOSURE;  Surgeon: Salvadore Dom, MD;  Location: [facility];  Service: Gynecology;  Laterality: N/A;  . laparascop    . LAPAROSCOPIC REMOVAL ABDOMINAL MASS  2000   LLQ    There  were no vitals filed for this visit.       Subjective Assessment - 09/01/16 0812    Subjective Patient reports she had non-Hodgkins lymphoma in 2004 treated with "light chemo". She reports she did well from that time.  She fell in 2015 and was found to have an enlarged lymph node when they did a left shoulder MRI. She had a scan last year and returned to Dr. Zella Richer this year, had a right axillary lymph node biopsy.  She reports the delay in care was due to personal factors. The biopsy was done 06/09/16 and reports her right arm pain began 08/03/16.   Pertinent History Non-Hodgkins lymphoma 2004; recurrence recently but was seen at [facility] and no treatment is needed. Hypertension.   Patient Stated Goals Decrease right arm pain   Currently in Pain? Yes   Pain Score 4    Pain Location Arm   Pain Orientation Right   Pain Descriptors / Indicators Burning   Pain Type Neuropathic pain   Pain Onset 1 to 4 weeks ago   Pain Frequency Intermittent   Aggravating Factors  Touching the arm   Pain Relieving Factors Not touching it   Multiple Pain Sites No            [facility] PT Assessment - 09/01/16 0001  Assessment   Medical Diagnosis Right arm pain   Referring Provider Dr. Jackolyn Confer   Onset Date/Surgical Date 06/09/16   Hand Dominance Right   Prior Therapy none     Precautions   Precautions Other (comment)   Precaution Comments Active non-hodgkins lymphoma     Restrictions   Weight Bearing Restrictions No     Balance Screen   Has the patient fallen in the past 6 months No   Has the patient had a decrease in activity level because of a fear of falling?  No   Is the patient reluctant to leave their home because of a fear of falling?  No     Home Environment   Living Environment Private residence   Living Arrangements Children  40 y.o. daughter, 16 and 4 y.o. 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   Leisure She does not exercise     Cognition   Overall Cognitive Status Within Functional Limits for tasks assessed     Posture/Postural Control   Posture/Postural Control Postural limitations   Postural Limitations Rounded Shoulders;Forward head     ROM / Strength   AROM / PROM / Strength AROM;Strength     AROM   AROM Assessment Site Shoulder;Cervical   Right/Left Shoulder Right;Left   Right Shoulder Extension 43 Degrees   Right Shoulder Flexion 148 Degrees   Right Shoulder ABduction 163 Degrees   Right Shoulder Internal Rotation 35 Degrees   Right Shoulder External Rotation 90 Degrees   Left Shoulder Extension 50 Degrees   Left Shoulder Flexion 147 Degrees   Left Shoulder ABduction 150 Degrees   Left Shoulder Internal Rotation 57 Degrees   Left Shoulder External Rotation 72 Degrees   Cervical Flexion WNL   Cervical Extension WNL   Cervical - Right Side Bend WNL   Cervical - Left Side Bend WNL   Cervical - Right Rotation WNL   Cervical - Left Rotation WNL     Strength   Strength Assessment Site Shoulder   Right/Left Shoulder Right;Left   Right Shoulder Flexion 4/5   Right Shoulder Extension 5/5   Right Shoulder ABduction 4/5   Right Shoulder Internal Rotation 5/5   Right Shoulder External Rotation 5/5   Left Shoulder Flexion 5/5   Left Shoulder Extension 5/5   Left Shoulder ABduction 5/5   Left Shoulder Internal Rotation 5/5   Left Shoulder External Rotation 5/5     Palpation   Palpation comment Bulky tissue / scar tissue present in right axilla around incision site     Special Tests    Special Tests --  Positive right arm neural tension testing           LYMPHEDEMA/ONCOLOGY QUESTIONNAIRE - 09/01/16 0834      Type   Cancer Type Non-Hodgins Lymphoma     Surgeries   Sentinel Lymph Node Biopsy Date 06/09/16    Other Surgery Date 06/09/16   Number Lymph Nodes Removed 3     Treatment   Past Chemotherapy Treatment Yes   Date 09/19/02     Lymphedema Assessments   Lymphedema Assessments Upper extremities     Right Upper Extremity Lymphedema   10 cm Proximal to Olecranon Process 37.2 cm   Olecranon Process 28.4 cm   10 cm  Proximal to Ulnar Styloid Process 25.8 cm   Just Proximal to Ulnar Styloid Process 17.7 cm   Across Hand at [employer] 20.6 cm   At [place] of 2nd Digit 6.5 cm     Left Upper Extremity Lymphedema   10 cm Proximal to Olecranon Process 38.2 cm   Olecranon Process 28.5 cm   10 cm Proximal to Ulnar Styloid Process 25.4 cm   Just Proximal to Ulnar Styloid Process 17.9 cm   Across Hand at [employer] 20.2 cm   At [place] of 2nd Digit 6.3 cm           [place] - 09/01/16 0001    Open a tight or new jar Mild difficulty   Do heavy household chores (wash walls, wash floors) Mild difficulty   Carry a shopping bag or briefcase Mild difficulty   Wash your back Mild difficulty   Use a knife to cut food Mild difficulty   Recreational activities in which you take some force or impact through your arm, shoulder, or hand (golf, hammering, tennis) Mild difficulty   During the past week, to what extent has your arm, shoulder or hand problem interfered with your normal social activities with family, friends, neighbors, or groups? Slightly   During the past week, to what extent has your arm, shoulder or hand problem limited your work or other regular daily activities Slightly   Arm, shoulder, or hand pain. Moderate   Tingling (pins and needles) in your arm, shoulder, or hand None   Difficulty Sleeping Moderate difficulty   DASH Score 27.27 %                     PT Education - 09/01/16 1146    Education provided Yes   Education Details Nerve stretches for RUE   Person(s) Educated Patient   Methods Explanation;Demonstration;Handout   Comprehension Returned  demonstration;Verbalized understanding                [facility] Term Clinic Goals - 09/01/16 1158      CC Long Term Goal  #1   Title Patient will demonstrate proper technique with home exercise program.   Time 4   Period Weeks   Status New     CC Long Term Goal  #2   Title Patient will report >/= 25% improvement in right arm pain and symptoms to tolerate daily tasks with greater ease.   Time 4   Period Weeks   Status New     CC Long Term Goal  #3   Title Patient will report >/= 25% less stiffness and limitations with performing work tasks.   Time 4   Period Weeks   Status New            Plan - 09/01/16 1149    Clinical Impression Statement Patient is a pleasant 55 y.o. woman s/p right sentinel node biopsy due to enlarged nodes from Non-Hodgkins lymphoma.  She reports having chemotherapy in 2004 for this same problem and that it stayed dormant until recently when she had a scan and then the node was biopsied.  She said currently it is not considered active disease and she went to [facility] and was told there is no treatment needed at this time.  Due to the node biopsy, she is having nerve pain down her arm which appears to originate in her axilla. She also has what feels like scar tissue in and around her incision site which  may be contributing to her symptoms.  She will benefit from PT to reduce arm pain and mobilize the tissue in her axilla.  She is somewhat limited with attending PT due to working 2 jobs.  Due to her lack of other comobidities, her eval is of low complexity.    Rehab Potential Good   Clinical Impairments Affecting Rehab Potential none   PT Frequency 3x / week   PT Duration 4 weeks   PT Treatment/Interventions ADLs/Self Care Home Management;Electrical Stimulation;Passive range of motion;Scar mobilization;Therapeutic exercise;Manual techniques;Therapeutic activities;Patient/family education   PT Next Visit Plan RUE neural stretching; axillary scar mobilization,  PROM right shoulder   PT Home Exercise Plan Neural stretches   Consulted and Agree with Plan of Care Patient      Patient will benefit from skilled therapeutic intervention in order to improve the following deficits and impairments:  Impaired UE functional use, Pain, Postural dysfunction  Visit Diagnosis: Pain in right arm - Plan: PT plan of care cert/re-cert  Abnormal posture - Plan: PT plan of care cert/re-cert  Stiffness of right shoulder, not elsewhere classified - Plan: PT plan of care cert/re-cert     Problem List Patient Active Problem List   Diagnosis Date Noted  . MDD (major depressive disorder) 06/19/2015  . Hyperglycemia 06/19/2015  . Essential hypertension 01/09/2015  . BMI 39.0-39.9,adult 01/09/2015  . Hyperlipemia 01/09/2015  . Left shoulder pain - managed by [facility] 06/26/2014  . Hodgkin disease, Hx of 2000, treated at [facility] 06/26/2014   Annia Friendly, PT 09/01/16 12:01 PM  [place], [place], [address] Phone: [phone]   Fax:  [phone]  Name: Whitney Santiago MRN: [id number] Date of Birth: Aug 09, 1961

## 2016-09-02 ENCOUNTER — Ambulatory Visit: Payer: BLUE CROSS/BLUE SHIELD | Admitting: Physical Therapy

## 2016-09-02 DIAGNOSIS — M25611 Stiffness of right shoulder, not elsewhere classified: Secondary | ICD-10-CM

## 2016-09-02 DIAGNOSIS — R293 Abnormal posture: Secondary | ICD-10-CM

## 2016-09-02 DIAGNOSIS — M79601 Pain in right arm: Secondary | ICD-10-CM

## 2016-09-02 NOTE — Therapy (Signed)
[place], [place], [address] Phone: [phone]   Fax:  [phone]  Physical Therapy Treatment  Patient Details  Name: Whitney Santiago MRN: [id number] Date of Birth: 1961-05-20 Referring Provider: Dr. Jackolyn Confer  Encounter Date: 09/02/2016      PT End of Session - 09/02/16 1752    Visit Number 2   Number of Visits 12   Date for PT Re-Evaluation 09/29/16   PT Start Time 0803   PT Stop Time [id number]   PT Time Calculation (min) 41 min   Activity Tolerance Patient tolerated treatment well   Behavior During Therapy [facility] for tasks assessed/performed      Past Medical History:  Diagnosis Date  . Allergy   . Anxiety    no med  . Depression    no med  . Fibroid   . History of migraine    Hx - last migraine 2 yrs ago, no longer a problem  . Hodgkin disease ([place]) 1999   lymphocytic predominant Hodkin's 2000, s/p 4 weeks Rituxan, chlorambucil 10/21/99-04/19/00  . Hx of iron deficiency anemia    Hx -per [facility] notes, since childhood, possible thalassemia  . Hyperglycemia   . Hyperlipemia    no med  . Hypertension   . LAD (lymphadenopathy), axillary    eval with onc and [facility] 2016  . Obesity   . STD (sexually transmitted disease)    gonorrhea about 30 years ago  . SVD (spontaneous vaginal delivery)    x 2    Past Surgical History:  Procedure Laterality Date  . AXILLARY LYMPH NODE BIOPSY Right 06/09/2016   Procedure: AXILLARY LYMPH NODE BIOPSY;  Surgeon: Jackolyn Confer, MD;  Location: [place];  Service: General;  Laterality: Right;  . BREAST SURGERY Left    due to bloody discharge  . COLONOSCOPY    . DILATATION & CURETTAGE/HYSTEROSCOPY WITH MYOSURE N/A 05/24/2016   Procedure: DILATATION & CURETTAGE/HYSTEROSCOPY WITH MYOSURE;  Surgeon: Salvadore Dom, MD;  Location: [facility];  Service: Gynecology;  Laterality: N/A;  . laparascop    . LAPAROSCOPIC REMOVAL ABDOMINAL MASS  2000   LLQ    There were  no vitals filed for this visit.      Subjective Assessment - 09/02/16 0806    Subjective Nothing new.  Did one of the stretches.   Currently in Pain? Yes   Pain Score 2   to 5 if touches it   Pain Location Arm   Pain Orientation Right   Aggravating Factors  touching the arm   Pain Relieving Factors not touching the arm                         [facility] Adult PT Treatment/Exercise - 09/02/16 0001      Exercises   Exercises Other Exercises   Other Exercises  Pt. is able to demonstrate her HEP from yesterday.     Shoulder Exercises: Seated   Other Seated Exercises shoulder rolls and active shoulder flexion and abduction at end of session     Manual Therapy   Manual Therapy Soft tissue mobilization;Myofascial release;Passive ROM;Neural Stretch   Soft tissue mobilization at right axilla area of tightness;    Myofascial Release crosshands across right axilla; right UE pulling in supine; right UE pulling in left sidelying with concurrent distraction at lower right flank   Passive ROM Right shoulder ir, er, abduction, and flexion, with stretch to tolerance for the latter  two; shoulder horizontal abduction stretch in supine with right UE over edge of mat   Neural Stretch right UE in supine                PT Education - 09/01/16 1146    Education provided Yes   Education Details Nerve stretches for RUE   Person(s) Educated Patient   Methods Explanation;Demonstration;Handout   Comprehension Returned demonstration;Verbalized understanding                [place] - 09/01/16 1158      CC Long Term Goal  #1   Title Patient will demonstrate proper technique with home exercise program.   Time 4   Period Weeks   Status New     CC Long Term Goal  #2   Title Patient will report >/= 25% improvement in right arm pain and symptoms to tolerate daily tasks with greater ease.   Time 4   Period Weeks   Status New     CC Long Term Goal  #3    Title Patient will report >/= 25% less stiffness and limitations with performing work tasks.   Time 4   Period Weeks   Status New            Plan - 09/02/16 1752    Clinical Impression Statement Patient reported feeling good after session, and expressed some relief that she could be helped through therapy, saying she thought she would need another surgery.  She tolerated manual techniques well today.   Rehab Potential Good   Clinical Impairments Affecting Rehab Potential none   PT Frequency 3x / week   PT Duration 4 weeks   PT Treatment/Interventions ADLs/Self Care Home Management;Electrical Stimulation;Passive range of motion;Scar mobilization;Therapeutic exercise;Manual techniques;Therapeutic activities;Patient/family education   PT Next Visit Plan RUE neural stretching; axillary scar mobilization, PROM right shoulder   PT Home Exercise Plan Neural stretches   Consulted and Agree with Plan of Care Patient      Patient will benefit from skilled therapeutic intervention in order to improve the following deficits and impairments:  Impaired UE functional use, Pain, Postural dysfunction  Visit Diagnosis: Pain in right arm  Abnormal posture  Stiffness of right shoulder, not elsewhere classified     Problem List Patient Active Problem List   Diagnosis Date Noted  . MDD (major depressive disorder) 06/19/2015  . Hyperglycemia 06/19/2015  . Essential hypertension 01/09/2015  . BMI 39.0-39.9,adult 01/09/2015  . Hyperlipemia 01/09/2015  . Left shoulder pain - managed by [facility] 06/26/2014  . Hodgkin disease, Hx of 2000, treated at [facility] 06/26/2014    [facility] 09/02/2016, 5:54 PM  [place], [place], [address] Phone: [phone]   Fax:  [phone]  Name: Whitney Santiago MRN: [id number] Date of Birth: 16-Dec-1960   Serafina Royals, PT 09/02/16 5:54 PM

## 2016-09-05 ENCOUNTER — Ambulatory Visit: Payer: BLUE CROSS/BLUE SHIELD

## 2016-09-05 DIAGNOSIS — M25611 Stiffness of right shoulder, not elsewhere classified: Secondary | ICD-10-CM

## 2016-09-05 DIAGNOSIS — M79601 Pain in right arm: Secondary | ICD-10-CM

## 2016-09-05 DIAGNOSIS — R293 Abnormal posture: Secondary | ICD-10-CM

## 2016-09-05 NOTE — Therapy (Signed)
[place], [place], [address] Phone: [phone]   Fax:  [phone]  Physical Therapy Treatment  Patient Details  Name: Whitney Santiago MRN: [id number] Date of Birth: [birth year] Referring Provider: Dr. Jackolyn Confer  Encounter Date: 09/05/2016      PT End of Session - 09/05/16 0919    Visit Number 3   Number of Visits 12   Date for PT Re-Evaluation 09/29/16   PT Start Time 0802   PT Stop Time 0907   PT Time Calculation (min) 65 min   Activity Tolerance Patient tolerated treatment well   Behavior During Therapy [facility] for tasks assessed/performed      Past Medical History:  Diagnosis Date  . Allergy   . Anxiety    no med  . Depression    no med  . Fibroid   . History of migraine    Hx - last migraine 2 yrs ago, no longer a problem  . Hodgkin disease ([place]) 1999   lymphocytic predominant Hodkin's 2000, s/p 4 weeks Rituxan, chlorambucil 10/21/99-04/19/00  . Hx of iron deficiency anemia    Hx -per [facility] notes, since childhood, possible thalassemia  . Hyperglycemia   . Hyperlipemia    no med  . Hypertension   . LAD (lymphadenopathy), axillary    eval with onc and [facility] 2016  . Obesity   . STD (sexually transmitted disease)    gonorrhea about 30 years ago  . SVD (spontaneous vaginal delivery)    x 2    Past Surgical History:  Procedure Laterality Date  . AXILLARY LYMPH NODE BIOPSY Right 06/09/2016   Procedure: AXILLARY LYMPH NODE BIOPSY;  Surgeon: Jackolyn Confer, MD;  Location: [place];  Service: General;  Laterality: Right;  . BREAST SURGERY Left    due to bloody discharge  . COLONOSCOPY    . DILATATION & CURETTAGE/HYSTEROSCOPY WITH MYOSURE N/A 05/24/2016   Procedure: DILATATION & CURETTAGE/HYSTEROSCOPY WITH MYOSURE;  Surgeon: Salvadore Dom, MD;  Location: [facility];  Service: Gynecology;  Laterality: N/A;  . laparascop    . LAPAROSCOPIC REMOVAL ABDOMINAL MASS  2000   LLQ    There were  no vitals filed for this visit.      Subjective Assessment - 09/05/16 0806    Subjective I am starting to have more feeling in the back of my Rt upper arm so I'm excited about that! Not having any pain currently. I did good with my exercises over the weekend, did them twice.    Pertinent History Non-Hodgkins lymphoma 2004; recurrence recently but was seen at [facility] and no treatment is needed. Hypertension.   Patient Stated Goals Decrease right arm pain   Currently in Pain? No/denies            [facility] PT Assessment - 09/05/16 0001      AROM   Right Shoulder Flexion 165 Degrees   Right Shoulder ABduction 172 Degrees   Right Shoulder Internal Rotation 38 Degrees                     [facility] Adult PT Treatment/Exercise - 09/05/16 0001      Manual Therapy   Manual Therapy Soft tissue mobilization;Myofascial release;Passive ROM;Neural Stretch   Soft tissue mobilization at right axilla area of tightness;    Myofascial Release crosshands across right axilla; right UE pulling in supine; right UE pulling in left sidelying with concurrent distraction at lower right flank   Passive ROM Right  shoulder ir, er, abduction, and flexion, with stretch to tolerance for the latter two; shoulder horizontal abduction stretch in supine with right UE over edge of mat   Neural Stretch --                PT Education - 09/05/16 0918    Education provided Yes   Education Details Cont AA/ROM stretching, stretch in doorway for end ROM and IR stretch with towel.    Person(s) Educated Patient   Methods Explanation;Demonstration   Comprehension Verbalized understanding                [facility] Goals - 09/05/16 [phone]      CC Long Term Goal  #1   Title Patient will demonstrate proper technique with home exercise program.   Status On-going     CC Long Term Goal  #2   Title Patient will report >/= 25% improvement in right arm pain and symptoms to tolerate daily tasks with  greater ease.   Baseline Pt reports already feeling at least 50% improved with Rt arm pain-09/05/16   Status Achieved     CC Long Term Goal  #3   Title Patient will report >/= 25% less stiffness and limitations with performing work tasks.   Baseline Pt reports feeling at least 50% improved with stiffness-09/05/16   Status Achieved            Plan - 09/05/16 0920    Clinical Impression Statement Pt conts to reports feeling good after session and she verbalized feeling increased ROM with putting her jacket on after session. Her A/ROM have improved with flexion and abduction, IR about the same so demonstrated for her towel IR stretch she an perform at home. Pt is very pleased with her progress thus far and reports feeling at least 50% improved overall since start of care.   Rehab Potential Good   Clinical Impairments Affecting Rehab Potential none   PT Frequency 3x / week   PT Duration 4 weeks   PT Treatment/Interventions ADLs/Self Care Home Management;Electrical Stimulation;Passive range of motion;Scar mobilization;Therapeutic exercise;Manual techniques;Therapeutic activities;Patient/family education   PT Next Visit Plan RUE neural stretching; axillary scar mobilization, PROM right shoulder   PT Home Exercise Plan Neural stretches; end range stretching in doorway and IR stretch with towel.    Consulted and Agree with Plan of Care Patient      Patient will benefit from skilled therapeutic intervention in order to improve the following deficits and impairments:  Impaired UE functional use, Pain, Postural dysfunction  Visit Diagnosis: Pain in right arm  Abnormal posture  Stiffness of right shoulder, not elsewhere classified     Problem List Patient Active Problem List   Diagnosis Date Noted  . MDD (major depressive disorder) 06/19/2015  . Hyperglycemia 06/19/2015  . Essential hypertension 01/09/2015  . BMI 39.0-39.9,adult 01/09/2015  . Hyperlipemia 01/09/2015  . Left  shoulder pain - managed by [facility] 06/26/2014  . Hodgkin disease, Hx of 2000, treated at [facility] 06/26/2014    Otelia Limes, PTA 09/05/2016, 9:31 AM  [place], [place], [address] Phone: [phone]   Fax:  [phone]  Name: Whitney Santiago MRN: [id number] Date of Birth: Sep 01, 1961

## 2016-09-14 ENCOUNTER — Ambulatory Visit: Payer: BLUE CROSS/BLUE SHIELD | Admitting: Physical Therapy

## 2016-09-14 DIAGNOSIS — M25611 Stiffness of right shoulder, not elsewhere classified: Secondary | ICD-10-CM

## 2016-09-14 DIAGNOSIS — M79601 Pain in right arm: Secondary | ICD-10-CM | POA: Diagnosis not present

## 2016-09-14 DIAGNOSIS — R293 Abnormal posture: Secondary | ICD-10-CM | POA: Diagnosis not present

## 2016-09-14 NOTE — Therapy (Addendum)
[place], [place], [address] Phone: [phone]   Fax:  [phone]  Physical Therapy Treatment  Patient Details  Name: Whitney Santiago MRN: [id number] Date of Birth: 1960/11/17 Referring Provider: Dr. Jackolyn Confer  Encounter Date: 09/14/2016      PT End of Session - 09/14/16 0949    Visit Number 4   Number of Visits 12   Date for PT Re-Evaluation 09/29/16   PT Start Time 0805   PT Stop Time 0849   PT Time Calculation (min) 44 min   Activity Tolerance Patient tolerated treatment well   Behavior During Therapy [facility] for tasks assessed/performed      Past Medical History:  Diagnosis Date  . Allergy   . Anxiety    no med  . Depression    no med  . Fibroid   . History of migraine    Hx - last migraine 2 yrs ago, no longer a problem  . Hodgkin disease ([place]) 1999   lymphocytic predominant Hodkin's 2000, s/p 4 weeks Rituxan, chlorambucil 10/21/99-04/19/00  . Hx of iron deficiency anemia    Hx -per [facility] notes, since childhood, possible thalassemia  . Hyperglycemia   . Hyperlipemia    no med  . Hypertension   . LAD (lymphadenopathy), axillary    eval with onc and [facility] 2016  . Obesity   . STD (sexually transmitted disease)    gonorrhea about 30 years ago  . SVD (spontaneous vaginal delivery)    x 2    Past Surgical History:  Procedure Laterality Date  . AXILLARY LYMPH NODE BIOPSY Right 06/09/2016   Procedure: AXILLARY LYMPH NODE BIOPSY;  Surgeon: Jackolyn Confer, MD;  Location: [place];  Service: General;  Laterality: Right;  . BREAST SURGERY Left    due to bloody discharge  . COLONOSCOPY    . DILATATION & CURETTAGE/HYSTEROSCOPY WITH MYOSURE N/A 05/24/2016   Procedure: DILATATION & CURETTAGE/HYSTEROSCOPY WITH MYOSURE;  Surgeon: Salvadore Dom, MD;  Location: [facility];  Service: Gynecology;  Laterality: N/A;  . laparascop    . LAPAROSCOPIC REMOVAL ABDOMINAL MASS  2000   LLQ    There were  no vitals filed for this visit.      Subjective Assessment - 09/14/16 0807    Subjective "I don't wake up in the middle of the night no more with those sharp pains, and I can feel back there (back of upper arm."  No questionsabout HEP   Currently in Pain? Yes   Pain Score 2    Pain Location Arm   Pain Orientation Right   Pain Descriptors / Indicators Sore   Aggravating Factors  working   Pain Relieving Factors stretches                         [facility] Adult PT Treatment/Exercise - 09/14/16 0001      Manual Therapy   Manual Therapy Scapular mobilization   Soft tissue mobilization at right axilla area of tightness;    Myofascial Release crosshands across right axilla; right UE pulling in supine; right UE pulling in left sidelying with concurrent distraction at lower right flank   Scapular Mobilization to right in left sidelying for protraction and depression   Passive ROM Right shoulder ir, er, abduction, and flexion, with stretch to tolerance for the latter two; shoulder horizontal abduction stretch in supine with right UE over edge of mat   Neural Stretch right UE in  supine                        Long Term Clinic Goals - 09/05/16 6226      CC Long Term Goal  #1   Title Patient will demonstrate proper technique with home exercise program.   Status On-going     CC Long Term Goal  #2   Title Patient will report >/= 25% improvement in right arm pain and symptoms to tolerate daily tasks with greater ease.   Baseline Pt reports already feeling at least 50% improved with Rt arm pain-09/05/16   Status Achieved     CC Long Term Goal  #3   Title Patient will report >/= 25% less stiffness and limitations with performing work tasks.   Baseline Pt reports feeling at least 50% improved with stiffness-09/05/16   Status Achieved            Plan - 09/14/16 0950    Clinical Impression Statement Continues to do well with therapy, and being able to reach  up better.  Still with visible and palpable tight tissue just superior to axillary scar.   Rehab Potential Good   Clinical Impairments Affecting Rehab Potential none   PT Frequency 3x / week   PT Duration 4 weeks   PT Treatment/Interventions ADLs/Self Care Home Management;Electrical Stimulation;Passive range of motion;Scar mobilization;Therapeutic exercise;Manual techniques;Therapeutic activities;Patient/family education   PT Next Visit Plan continue myofascial release and soft tissue mobilization at right axilla/flank/chest area   Consulted and Agree with Plan of Care Patient      Patient will benefit from skilled therapeutic intervention in order to improve the following deficits and impairments:  Impaired UE functional use, Pain, Postural dysfunction  Visit Diagnosis: Pain in right arm  Abnormal posture  Stiffness of right shoulder, not elsewhere classified     Problem List Patient Active Problem List   Diagnosis Date Noted  . MDD (major depressive disorder) 06/19/2015  . Hyperglycemia 06/19/2015  . Essential hypertension 01/09/2015  . BMI 39.0-39.9,adult 01/09/2015  . Hyperlipemia 01/09/2015  . Left shoulder pain - managed by [facility] 06/26/2014  . Hodgkin disease, Hx of 2000, treated at [facility] 06/26/2014    [facility] 09/14/2016, 9:54 AM  [place], [place], [address] Phone: [phone]   Fax:  [phone]  Name: Whitney Santiago MRN: [id number] Date of Birth: [birth year]  Serafina Royals, PT 09/14/16 9:54 AM  PHYSICAL THERAPY DISCHARGE SUMMARY  Visits from Start of Care: 4  Current functional level related to goals / functional outcomes: Pt. Had made good progress with therapy; goals largely met as noted above.   Remaining deficits: Unknown:  Pt. Did not complete her course of therapy.   Education / Equipment: Home exercise program. Plan: Patient agrees to  discharge.  Patient goals were partially met. Patient is being discharged due to not returning since the last visit.  ?????    Serafina Royals, PT 11/30/17 3:59 PM

## 2016-09-20 ENCOUNTER — Ambulatory Visit: Payer: BLUE CROSS/BLUE SHIELD

## 2016-09-22 ENCOUNTER — Ambulatory Visit: Payer: BLUE CROSS/BLUE SHIELD

## 2016-10-14 ENCOUNTER — Other Ambulatory Visit: Payer: Self-pay | Admitting: Family Medicine

## 2016-10-17 ENCOUNTER — Ambulatory Visit: Payer: BLUE CROSS/BLUE SHIELD | Admitting: Family Medicine

## 2016-10-23 NOTE — Progress Notes (Signed)
HPI:  Whitney Santiago is a pleasant 56 yo with a PMH significant for HTN, obesity, hyperglycemia, hyperlipidemia and recurrently lymphoma. She is seeing specialists at [facility] for the [employer] and was deciding on treatment at her last visit here. Reports doing well. Reports has opted on observation for the lymphoma and is seeing [place] quarterly for follow up. No complaints. No regular exercise. Diet poor. Denies fevers, malaise, CP, SOb, DOE. Due for mammogram (sees gyn), hgba1c, lipids, bmp, cbc.  ROS: See pertinent positives and negatives per HPI.  Past Medical History:  Diagnosis Date  . Allergy   . Anxiety    no med  . Depression    no med  . Fibroid   . History of migraine    Hx - last migraine 2 yrs ago, no longer a problem  . Hodgkin disease ([place]) 1999   lymphocytic predominant Hodkin's 2000, s/p 4 weeks Rituxan, chlorambucil 10/21/99-04/19/00  . Hx of iron deficiency anemia    Hx -per [facility] notes, since childhood, possible thalassemia  . Hyperglycemia   . Hyperlipemia    no med  . Hypertension   . LAD (lymphadenopathy), axillary    eval with onc and [facility] 2016  . Obesity   . STD (sexually transmitted disease)    gonorrhea about 30 years ago  . SVD (spontaneous vaginal delivery)    x 2    Past Surgical History:  Procedure Laterality Date  . AXILLARY LYMPH NODE BIOPSY Right 06/09/2016   Procedure: AXILLARY LYMPH NODE BIOPSY;  Surgeon: Jackolyn Confer, MD;  Location: [place];  Service: General;  Laterality: Right;  . BREAST SURGERY Left    due to bloody discharge  . COLONOSCOPY    . DILATATION & CURETTAGE/HYSTEROSCOPY WITH MYOSURE N/A 05/24/2016   Procedure: DILATATION & CURETTAGE/HYSTEROSCOPY WITH MYOSURE;  Surgeon: Salvadore Dom, MD;  Location: [facility];  Service: Gynecology;  Laterality: N/A;  . laparascop    . LAPAROSCOPIC REMOVAL ABDOMINAL MASS  2000   LLQ    Family History  Problem Relation Age of Onset  . Hypertension Mother   . Stroke Mother   . Hypertension  Father   . Diabetes Father   . Hypertension Sister   . Hypertension Brother   . Hypertension Daughter     Social History   Social History  . Marital status: Divorced    Spouse name: N/A  . Number of children: N/A  . Years of education: N/A   Social History Main Topics  . Smoking status: Never Smoker  . Smokeless tobacco: Never Used  . Alcohol use No  . Drug use: No  . Sexual activity: Yes    Partners: Male    Birth control/ protection: None   Other Topics Concern  . 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  Calcium Carb-Cholecalciferol (CALCIUM + D3 PO), Take 1 tablet by mouth daily. , Disp: , Rfl:  .  Cholecalciferol (VITAMIN D3) 2000 UNITS TABS, Take 2,000 Units by mouth daily. , Disp: , Rfl:  .  hydrochlorothiazide (HYDRODIURIL) 25 MG tablet, TAKE 1 TABLET (25 MG TOTAL) BY MOUTH DAILY., Disp: 90 tablet, Rfl: 1 .  lisinopril (PRINIVIL,ZESTRIL) 5 MG tablet, TAKE 1 TABLET (5 MG TOTAL) BY MOUTH DAILY., Disp: 90 tablet, Rfl: 1  EXAM:  Vitals:   10/24/16 0841  BP: 122/84  Pulse: 84  Temp: 97.8 F (36.6 C)    Body mass index is 40.92 kg/m.  GENERAL: vitals reviewed and listed above, alert, oriented, appears well hydrated and in no acute distress  HEENT: atraumatic, conjunttiva clear, no obvious abnormalities on inspection of external nose and ears  NECK: no obvious masses on inspection  LUNGS: clear to auscultation bilaterally, no wheezes, rales or rhonchi, good air movement  CV: HRRR, no peripheral edema  MS: moves all extremities without noticeable abnormality  PSYCH: pleasant and cooperative, no obvious depression or anxiety  ASSESSMENT AND PLAN:  Discussed the following assessment and plan:  Essential  hypertension - Plan: Basic metabolic panel, CBC (no diff)  BMI 40.0-44.9, adult (HCC)  Hyperlipidemia, unspecified hyperlipidemia type - Plan: Lipid Panel  Hyperglycemia - Plan: Hemoglobin A1c  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 Mina Marble at [facility] in March -Patient advised to return or notify a doctor immediately if symptoms worsen or persist or new concerns arise.  Patient Instructions  BEFORE YOU LEAVE: -follow up: 3 months -labs   Call to make sure you have follow up with your cancer specialist at [facility].  Call today to schedule your mammogram.  If you are not yet signed up for [facility], please SIGN UP TODAY. We now offer online scheduling, same day appointments and extended hours. WHEN YOU DON'T FEEL YOUR BEST.Marland KitchenMarland KitchenWE ARE HERE TO HELP.  We have ordered labs or studies at this visit. It can take up to 1-2 weeks for results and processing. IF results require follow up or explanation, we will call you with instructions. Clinically stable results will be released to your [facility]. If you have not heard from [place] or cannot find your results in [facility] in 2 weeks please contact our office at [phone].  If you are not yet signed up for [facility], please consider signing up.  We have ordered labs or studies at this visit. It can take up to 1-2 weeks for results and processing. IF results require follow up or explanation, we will call you with instructions. Clinically stable results will be released to your [facility]. If you have not heard from [place] or cannot find your results in [facility] in 2 weeks please contact our office at [phone].    We recommend the following healthy lifestyle for LIFE: 1) Small portions.   Tip: eat off of a salad plate instead of a dinner plate.  Tip: It is ok to feel hungry  after a meal - that likely means you ate an appropriate portion.  Tip: if you need more or a snack choose fruits, veggies and/or a handful of nuts or seeds.  2) Eat a healthy clean diet.  * Tip: Avoid (less then 1 serving per week): processed foods, sweets, sweetened drinks, white starches (rice, flour, bread, potatoes, pasta, etc), red meat, fast foods, butter  *Tip: CHOOSE instead   * 5-9 servings per day of fresh or frozen fruits and vegetables (but not corn, potatoes, bananas, canned or dried fruit)   *nuts and seeds, beans   *olives and olive oil   *small portions of lean meats such as fish and white  chicken    *small portions of whole grains  3)Get at least 150 minutes of sweaty aerobic exercise per week.  4)Reduce stress - consider counseling, meditation and relaxation to balance other aspects of your life.           Colin Benton R., DO

## 2016-10-24 ENCOUNTER — Encounter: Payer: Self-pay | Admitting: Family Medicine

## 2016-10-24 ENCOUNTER — Ambulatory Visit (INDEPENDENT_AMBULATORY_CARE_PROVIDER_SITE_OTHER): Payer: BLUE CROSS/BLUE SHIELD | Admitting: Family Medicine

## 2016-10-24 VITALS — BP 122/84 | HR 84 | Temp 97.8°F | Ht 63.0 in | Wt 231.0 lb

## 2016-10-24 DIAGNOSIS — R739 Hyperglycemia, unspecified: Secondary | ICD-10-CM | POA: Diagnosis not present

## 2016-10-24 DIAGNOSIS — L600 Ingrowing nail: Secondary | ICD-10-CM

## 2016-10-24 DIAGNOSIS — I10 Essential (primary) hypertension: Secondary | ICD-10-CM

## 2016-10-24 DIAGNOSIS — E785 Hyperlipidemia, unspecified: Secondary | ICD-10-CM | POA: Diagnosis not present

## 2016-10-24 DIAGNOSIS — C8103 Nodular lymphocyte predominant Hodgkin lymphoma, intra-abdominal lymph nodes: Secondary | ICD-10-CM

## 2016-10-24 DIAGNOSIS — Z6841 Body Mass Index (BMI) 40.0 and over, adult: Secondary | ICD-10-CM

## 2016-10-24 LAB — BASIC METABOLIC PANEL
BUN: 18 mg/dL (ref 6–23)
CALCIUM: 9.6 mg/dL (ref 8.4–10.5)
CO2: 30 meq/L (ref 19–32)
Chloride: 101 mEq/L (ref 96–112)
Creatinine, Ser: 0.77 mg/dL (ref 0.40–1.20)
GFR: 100.55 mL/min (ref >60.00)
Glucose, Bld: 120 mg/dL — ABNORMAL HIGH (ref 70–99)
Potassium: 3.8 mEq/L (ref 3.5–5.1)
SODIUM: 137 meq/L (ref 135–145)

## 2016-10-24 LAB — CBC
HCT: 38.6 % (ref 36.0–46.0)
Hemoglobin: 12.6 g/dL (ref 12.0–15.0)
MCHC: 32.7 g/dL (ref 30.0–36.0)
MCV: 85.2 fl (ref 78.0–100.0)
PLATELETS: 396 10*3/uL (ref 150.0–400.0)
RBC: 4.53 Mil/uL (ref 3.87–5.11)
RDW: 14.9 % (ref 11.5–15.5)
WBC: 8.8 10*3/uL (ref 4.0–10.5)

## 2016-10-24 LAB — LIPID PANEL
CHOLESTEROL: 207 mg/dL — AB (ref 0–200)
HDL: 57.8 mg/dL (ref >39.00)
LDL CALC: 132 mg/dL — AB (ref 0–99)
NONHDL: 149.41
Total CHOL/HDL Ratio: 4
Triglycerides: 85 mg/dL (ref 0.0–149.0)
VLDL: 17 mg/dL (ref 0.0–40.0)

## 2016-10-24 LAB — HEMOGLOBIN A1C: HEMOGLOBIN A1C: 6.3 % (ref 4.6–6.5)

## 2016-10-24 NOTE — Progress Notes (Signed)
Pre visit review using our clinic review tool, if applicable. No additional management support is needed unless otherwise documented below in the visit note. 

## 2016-10-24 NOTE — Addendum Note (Signed)
Addended by: Agnes Lawrence on: 10/24/2016 12:11 PM   Modules accepted: Orders

## 2016-10-24 NOTE — Patient Instructions (Signed)
BEFORE YOU LEAVE: -follow up: 3 months -labs   Call to make sure you have follow up with your cancer specialist at [facility].  Call today to schedule your mammogram.  If you are not yet signed up for [facility], please SIGN UP TODAY. We now offer online scheduling, same day appointments and extended hours. WHEN YOU DON'T FEEL YOUR BEST.Marland KitchenMarland KitchenWE ARE HERE TO HELP.  We have ordered labs or studies at this visit. It can take up to 1-2 weeks for results and processing. IF results require follow up or explanation, we will call you with instructions. Clinically stable results will be released to your [facility]. If you have not heard from [place] or cannot find your results in [facility] in 2 weeks please contact our office at [phone].  If you are not yet signed up for [facility], please consider signing up.  We have ordered labs or studies at this visit. It can take up to 1-2 weeks for results and processing. IF results require follow up or explanation, we will call you with instructions. Clinically stable results will be released to your [facility]. If you have not heard from [place] or cannot find your results in [facility] in 2 weeks please contact our office at [phone].    We recommend the following healthy lifestyle for LIFE: 1) Small portions.   Tip: eat off of a salad plate instead of a dinner plate.  Tip: It is ok to feel hungry after a meal - that likely means you ate an appropriate portion.  Tip: if you need more or a snack choose fruits, veggies and/or a handful of nuts or seeds.  2) Eat a healthy clean diet.  * Tip: Avoid (less then 1 serving per week): processed foods, sweets, sweetened drinks, white starches (rice, flour, bread, potatoes, pasta, etc), red meat, fast foods, butter  *Tip: CHOOSE instead   * 5-9 servings per day of fresh or frozen fruits and vegetables (but not corn, potatoes, bananas, canned or dried fruit)   *nuts and seeds, beans   *olives and olive oil   *small portions of lean meats  such as fish and white chicken    *small portions of whole grains  3)Get at least 150 minutes of sweaty aerobic exercise per week.  4)Reduce stress - consider counseling, meditation and relaxation to balance other aspects of your life.

## 2016-10-26 ENCOUNTER — Telehealth: Payer: Self-pay | Admitting: Family Medicine

## 2016-10-26 NOTE — Telephone Encounter (Signed)
Whitney Santiago pt state she is returning your call.

## 2016-10-27 NOTE — Telephone Encounter (Signed)
I left a detailed message stating the results were left on her voicemail a few days ago and to call back if she has any further questions.

## 2016-10-31 ENCOUNTER — Other Ambulatory Visit: Payer: Self-pay | Admitting: Family Medicine

## 2016-10-31 DIAGNOSIS — Z1231 Encounter for screening mammogram for malignant neoplasm of breast: Secondary | ICD-10-CM

## 2016-11-18 ENCOUNTER — Encounter: Payer: Self-pay | Admitting: Podiatry

## 2016-11-18 ENCOUNTER — Ambulatory Visit: Payer: BLUE CROSS/BLUE SHIELD

## 2016-11-18 ENCOUNTER — Ambulatory Visit (INDEPENDENT_AMBULATORY_CARE_PROVIDER_SITE_OTHER): Payer: BLUE CROSS/BLUE SHIELD | Admitting: Podiatry

## 2016-11-18 VITALS — BP 158/100 | HR 87

## 2016-11-18 DIAGNOSIS — B351 Tinea unguium: Secondary | ICD-10-CM | POA: Diagnosis not present

## 2016-11-18 DIAGNOSIS — L603 Nail dystrophy: Secondary | ICD-10-CM

## 2016-11-18 DIAGNOSIS — L600 Ingrowing nail: Secondary | ICD-10-CM

## 2016-11-18 NOTE — Progress Notes (Signed)
   Subjective:    Patient ID: Whitney Santiago, female    DOB: November 15, 1960, 56 y.o.   MRN: [id number]  HPI  56 year old female presents the office with concerns of her toenails becoming dark, discolored, thick on the right foot was gently her right big toe and also this toenails also become ingrown. She gets pain to the toenail a toenail gets elongated as long she keeps the toenail trimmed does not become painful. She denies any swelling, redness or any drainage. She said no recent treatment for this. She has no other complaints today.  Review of Systems  All other systems reviewed and are negative.      Objective:   Physical Exam General: AAO x3, NAD  Dermatological: Nails are hypertrophic, dystrophic, brittle, discolored, elongated x 5. No surrounding redness or drainage.there is mild incurvation of the right hallux toenail how there is no tenderness palpation. There is no swelling redness or drainage or any signs of infection. No open lesions or pre-ulcerative lesions are identified today.   Vascular: Dorsalis Pedis artery and Posterior Tibial artery pedal pulses are 2/4 bilateral with immedate capillary fill time. There is no pain with calf compression, swelling, warmth, erythema.   Neruologic: Grossly intact via light touch bilateral. Vibratory intact via tuning fork bilateral. Protective threshold with Semmes Wienstein monofilament intact to all pedal sites bilateral.   Musculoskeletal: No gross boney pedal deformities bilateral. No pain, crepitus, or limitation noted with foot and ankle range of motion bilateral. Muscular strength 5/5 in all groups tested bilateral.  Gait: Unassisted, Nonantalgic.      Assessment & Plan:  56 year old female right hallux onychodystrophy likely onychomycosis as well -Treatment options discussed including all alternatives, risks, and complications -Etiology of symptoms were discussed -I discussed ingrown toenail partial nail avulsion however is not  painful this time there is no signs of infection. Given the dystrophy of her toenail is likely recur. With the treating the nail fungus we more appropriate for her. She agrees this plan. I trimmed her toenails on the right side and this was sent for culture/biopsy. -RTC after nail culture or sooner if needed.  Celesta Gentile, DPM

## 2016-11-25 ENCOUNTER — Ambulatory Visit: Payer: BLUE CROSS/BLUE SHIELD

## 2016-12-09 ENCOUNTER — Ambulatory Visit: Payer: BLUE CROSS/BLUE SHIELD

## 2016-12-23 ENCOUNTER — Other Ambulatory Visit: Payer: Self-pay | Admitting: Podiatry

## 2016-12-23 ENCOUNTER — Ambulatory Visit (INDEPENDENT_AMBULATORY_CARE_PROVIDER_SITE_OTHER): Payer: BLUE CROSS/BLUE SHIELD | Admitting: Podiatry

## 2016-12-23 DIAGNOSIS — Z79899 Other long term (current) drug therapy: Secondary | ICD-10-CM | POA: Diagnosis not present

## 2016-12-23 DIAGNOSIS — B351 Tinea unguium: Secondary | ICD-10-CM

## 2016-12-23 MED ORDER — TERBINAFINE HCL 250 MG PO TABS: 250.0000 mg | ORAL_TABLET | Freq: Every day | ORAL | 0 refills | Status: DC

## 2016-12-23 NOTE — Progress Notes (Signed)
Subjective: 56 year old female presents the also discussed nail culture results. She is at the nails and in the same the are not painful and she denies any redness or drainage. Denies any systemic complaints such as fevers, chills, nausea, vomiting. No acute changes since last appointment, and no other complaints at this time.   Objective: AAO x3, NAD DP/PT pulses palpable bilaterally, CRT less than 3 seconds Nails and the right foot continued be hypertrophic, dystrophic, discolored with yellow to brown discoloration. There is no swelling redness or drainage from the toenail sites there is no pain today.  No open lesions or pre-ulcerative lesions of the right. There is no pain with calf compression, swelling, warmth, erythema.  Assessment: 56 year old female with onychomycosis  Plan: -Treatment options discussed including all alternatives, risks, and complications -Etiology of symptoms were discussed -I discussed nail culture results of the patient which were positive for onychomycosis. After discussion in regards to treatment options she elected proceed with oral treatment. Lamisil was prescribed. Discussed side effects the medication. Also prescribed LFTs and CBC. Do not start the medications I call her with results of blood work and she verbally understood this. -Follow up in 6 weeks or sooner if needed. Call any questions or concerns meantime.  Celesta Gentile, DPM

## 2016-12-23 NOTE — Patient Instructions (Signed)

## 2016-12-24 LAB — CBC WITH DIFFERENTIAL/PLATELET
Basophils Absolute: 0 10*3/uL (ref 0.0–0.2)
Basos: 0 %
EOS (ABSOLUTE): 0.1 10*3/uL (ref 0.0–0.4)
EOS: 1 %
HEMATOCRIT: 35.3 % (ref 34.0–46.6)
Hemoglobin: 11.7 g/dL (ref 11.1–15.9)
Immature Grans (Abs): 0 10*3/uL (ref 0.0–0.1)
Immature Granulocytes: 1 %
LYMPHS ABS: 3.2 10*3/uL — AB (ref 0.7–3.1)
Lymphs: 37 %
MCH: 27 pg (ref 26.6–33.0)
MCHC: 33.1 g/dL (ref 31.5–35.7)
MCV: 82 fL (ref 79–97)
Monocytes Absolute: 0.5 10*3/uL (ref 0.1–0.9)
Monocytes: 6 %
NEUTROS PCT: 55 %
Neutrophils Absolute: 4.8 10*3/uL (ref 1.4–7.0)
PLATELETS: 378 10*3/uL (ref 150–379)
RBC: 4.33 x10E6/uL (ref 3.77–5.28)
RDW: 14.8 % (ref 12.3–15.4)
WBC: 8.7 10*3/uL (ref 3.4–10.8)

## 2016-12-24 LAB — HEPATIC FUNCTION PANEL (6)
ALK PHOS: 83 IU/L (ref 39–117)
ALT: 17 IU/L (ref 0–32)
AST: 21 IU/L (ref 0–40)
Albumin: 4.2 g/dL (ref 3.5–5.5)
BILIRUBIN TOTAL: 0.2 mg/dL (ref 0.0–1.2)
Bilirubin, Direct: 0.08 mg/dL (ref 0.00–0.40)

## 2016-12-27 ENCOUNTER — Telehealth: Payer: Self-pay | Admitting: *Deleted

## 2016-12-27 NOTE — Telephone Encounter (Deleted)
-----   Message from Trula Slade, DPM sent at 12/26/2016  7:52 AM EDT ----- OK to start lamisil. Please let her know.

## 2016-12-28 NOTE — Telephone Encounter (Addendum)
-----   Message from Trula Slade, DPM sent at 12/26/2016  7:52 AM EDT ----- OK to start lamisil. Please let her know. 12/28/2016-pt presents to office for results.

## 2016-12-30 ENCOUNTER — Ambulatory Visit: Payer: BLUE CROSS/BLUE SHIELD

## 2017-01-06 ENCOUNTER — Ambulatory Visit
Admission: RE | Admit: 2017-01-06 | Discharge: 2017-01-06 | Disposition: A | Discharge status: Home / Self Care | Payer: BLUE CROSS/BLUE SHIELD | Source: Ambulatory Visit | Attending: Family Medicine | Admitting: Family Medicine

## 2017-01-06 DIAGNOSIS — Z1231 Encounter for screening mammogram for malignant neoplasm of breast: Secondary | ICD-10-CM | POA: Diagnosis not present

## 2017-01-20 DIAGNOSIS — C8104 Nodular lymphocyte predominant Hodgkin lymphoma, lymph nodes of axilla and upper limb: Secondary | ICD-10-CM | POA: Diagnosis not present

## 2017-01-27 ENCOUNTER — Ambulatory Visit: Payer: BLUE CROSS/BLUE SHIELD | Admitting: Family Medicine

## 2017-02-03 ENCOUNTER — Ambulatory Visit (INDEPENDENT_AMBULATORY_CARE_PROVIDER_SITE_OTHER): Payer: BLUE CROSS/BLUE SHIELD | Admitting: Podiatry

## 2017-02-03 ENCOUNTER — Encounter: Payer: Self-pay | Admitting: Podiatry

## 2017-02-03 DIAGNOSIS — B351 Tinea unguium: Secondary | ICD-10-CM

## 2017-02-03 DIAGNOSIS — Z79899 Other long term (current) drug therapy: Secondary | ICD-10-CM | POA: Diagnosis not present

## 2017-02-03 NOTE — Patient Instructions (Signed)

## 2017-02-03 NOTE — Progress Notes (Signed)
Subjective: 56 year old female presents the office for follow-up evaluation 6 weeks officially Lamisil. She denies any side effects the medication. She states that she has noticed improvement in her nails. No pain toenails. There is no swelling redness or drainage. Denies any systemic complaints such as fevers, chills, nausea, vomiting. No acute changes since last appointment, and no other complaints at this time.   Objective: AAO x3, NAD DP/PT pulses palpable bilaterally, CRT less than 3 seconds Nails continue dystrophic, discolored most notably the right hallux toenail however there does appear to be evidence of clearing on the proximal nail border. There is no pain the nail is no surrounding redness or drainage. No open lesions or pre-ulcerative lesions.  No pain with calf compression, swelling, warmth, erythema  Assessment: Onychomycosis, improvement  Plan: -All treatment options discussed with the patient including all alternatives, risks, complications.  -Recommended continued Lamisil for total of 90 days. May consider joint 4 months total. We'll recheck LFTs and CBC today. Continue to monitor for any side effects the medication. -Patient encouraged to call the office with any questions, concerns, change in symptoms.   Celesta Gentile, DPM

## 2017-02-17 ENCOUNTER — Ambulatory Visit: Payer: BLUE CROSS/BLUE SHIELD | Admitting: Family Medicine

## 2017-02-24 ENCOUNTER — Telehealth: Payer: Self-pay | Admitting: Podiatry

## 2017-02-24 DIAGNOSIS — Z79899 Other long term (current) drug therapy: Secondary | ICD-10-CM

## 2017-02-24 NOTE — Telephone Encounter (Signed)
Reordered Hepatic function and CBC with Diff for [facility].

## 2017-02-24 NOTE — Telephone Encounter (Signed)
Pt needs new lab orders sent to [facility]

## 2017-02-27 DIAGNOSIS — Z79899 Other long term (current) drug therapy: Secondary | ICD-10-CM | POA: Diagnosis not present

## 2017-02-27 LAB — CBC WITH DIFFERENTIAL/PLATELET
BASOS ABS: 0 10*3/uL (ref 0.0–0.2)
Basos: 0 %
EOS (ABSOLUTE): 0.1 10*3/uL (ref 0.0–0.4)
Eos: 1 %
Hematocrit: 33.5 % — ABNORMAL LOW (ref 34.0–46.6)
Hemoglobin: 11.6 g/dL (ref 11.1–15.9)
LYMPHS ABS: 3.5 10*3/uL — AB (ref 0.7–3.1)
Lymphs: 36 %
MCH: 27.6 pg (ref 26.6–33.0)
MCHC: 34.6 g/dL (ref 31.5–35.7)
MCV: 80 fL (ref 79–97)
MONOCYTES: 7 %
MONOS ABS: 0.7 10*3/uL (ref 0.1–0.9)
NEUTROS ABS: 5.5 10*3/uL (ref 1.4–7.0)
Neutrophils: 56 %
PLATELETS: 382 10*3/uL — AB (ref 150–379)
RBC: 4.2 x10E6/uL (ref 3.77–5.28)
RDW: 15 % (ref 12.3–15.4)
WBC: 9.7 10*3/uL (ref 3.4–10.8)

## 2017-02-27 LAB — HEPATIC FUNCTION PANEL
ALK PHOS: 94 IU/L (ref 39–117)
ALT: 14 IU/L (ref 0–32)
AST: 17 IU/L (ref 0–40)
Albumin: 4.1 g/dL (ref 3.5–5.5)
Bilirubin Total: <0.2 mg/dL (ref 0.0–1.2)
Total Protein: 7.3 g/dL (ref 6.0–8.5)

## 2017-02-28 ENCOUNTER — Other Ambulatory Visit: Payer: Self-pay | Admitting: Family Medicine

## 2017-03-01 ENCOUNTER — Telehealth: Payer: Self-pay | Admitting: *Deleted

## 2017-03-01 NOTE — Telephone Encounter (Addendum)
-----   Message from Trula Slade, DPM sent at 02/28/2017  7:15 AM EDT ----- Blood work normal, she can continue lamisil for 3 months total. 03/01/2017-Pt called for blood work results. Left message informing pt of Dr. Leigh Aurora review of results and orders.

## 2017-03-10 ENCOUNTER — Ambulatory Visit (INDEPENDENT_AMBULATORY_CARE_PROVIDER_SITE_OTHER): Payer: BLUE CROSS/BLUE SHIELD | Admitting: Family Medicine

## 2017-03-10 ENCOUNTER — Ambulatory Visit: Payer: BLUE CROSS/BLUE SHIELD | Admitting: Podiatry

## 2017-03-10 ENCOUNTER — Encounter: Payer: Self-pay | Admitting: Family Medicine

## 2017-03-10 VITALS — BP 130/90 | HR 88 | Temp 98.3°F | Wt 235.1 lb

## 2017-03-10 DIAGNOSIS — E785 Hyperlipidemia, unspecified: Secondary | ICD-10-CM | POA: Diagnosis not present

## 2017-03-10 DIAGNOSIS — C8103 Nodular lymphocyte predominant Hodgkin lymphoma, intra-abdominal lymph nodes: Secondary | ICD-10-CM

## 2017-03-10 DIAGNOSIS — R739 Hyperglycemia, unspecified: Secondary | ICD-10-CM | POA: Diagnosis not present

## 2017-03-10 DIAGNOSIS — I10 Essential (primary) hypertension: Secondary | ICD-10-CM | POA: Diagnosis not present

## 2017-03-10 MED ORDER — LISINOPRIL 10 MG PO TABS: 10.0000 mg | ORAL_TABLET | Freq: Every day | ORAL | 3 refills | Status: DC

## 2017-03-10 NOTE — Progress Notes (Signed)
HPI:  Whitney Santiago is a pleasant 56 yo with a PMH significant for HTN, obesity, hyperglycemia, hyperlipidemia and recurrently lymphoma. She is seeing specialists at [facility] for the [place]. Reports is doing well. Reports lymphoma in remission and monitors at [facility] q 6 months. Taking BP meds. No CP, OSB, DOE, HA. Poor diet - LOTS of sugar (sweet tea, starbucks, lots of breads and simple starches). No regular exercise. Wants to loose weight.  ROS: See pertinent positives and negatives per HPI.  Past Medical History:  Diagnosis Date  . Allergy   . Anxiety    no med  . Depression    no med  . Fibroid   . History of migraine    Hx - last migraine 2 yrs ago, no longer a problem  . Hodgkin disease ([place]) 1999   lymphocytic predominant Hodkin's 2000, s/p 4 weeks Rituxan, chlorambucil 10/21/99-04/19/00  . Hx of iron deficiency anemia    Hx -per [facility] notes, since childhood, possible thalassemia  . Hyperglycemia   . Hyperlipemia    no med  . Hypertension   . LAD (lymphadenopathy), axillary    eval with onc and [facility] 2016  . Obesity   . STD (sexually transmitted disease)    gonorrhea about 30 years ago  . SVD (spontaneous vaginal delivery)    x 2    Past Surgical History:  Procedure Laterality Date  . AXILLARY LYMPH NODE BIOPSY Right 06/09/2016   Procedure: AXILLARY LYMPH NODE BIOPSY;  Surgeon: Jackolyn Confer, MD;  Location: [place];  Service: General;  Laterality: Right;  . BREAST EXCISIONAL BIOPSY Left 2006  . BREAST SURGERY Left    due to bloody discharge  . COLONOSCOPY    . DILATATION & CURETTAGE/HYSTEROSCOPY WITH MYOSURE N/A 05/24/2016   Procedure: DILATATION & CURETTAGE/HYSTEROSCOPY WITH MYOSURE;  Surgeon: Salvadore Dom, MD;  Location: [facility];  Service: Gynecology;  Laterality: N/A;  . laparascop    . LAPAROSCOPIC REMOVAL ABDOMINAL MASS  2000   LLQ    Family History  Problem Relation Age of Onset  . Hypertension Mother   . Stroke Mother   . Hypertension Father   . Diabetes  Father   . Hypertension Sister   . Hypertension Brother   . Hypertension Daughter   . Breast cancer Neg Hx     Social History   Social History  . Marital status: Divorced    Spouse name: N/A  . Number of children: N/A  . Years of education: N/A   Social History Main Topics  . Smoking status: Never Smoker  . Smokeless tobacco: Never Used  . Alcohol use No  . Drug use: No  . Sexual activity: Yes    Partners: Male    Birth control/ protection: None   Other Topics Concern  . 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  Calcium Carb-Cholecalciferol (CALCIUM + D3 PO), Take 1 tablet by mouth daily. , Disp: , Rfl:  .  Cholecalciferol (VITAMIN D3) 2000 UNITS TABS, Take 2,000 Units by mouth daily. , Disp: , Rfl:  .  hydrochlorothiazide (HYDRODIURIL) 25 MG tablet, TAKE 1 TABLET (25 MG TOTAL) BY MOUTH  DAILY., Disp: 90 tablet, Rfl: 0 .  lisinopril (PRINIVIL,ZESTRIL) 10 MG tablet, Take 1 tablet (10 mg total) by mouth daily., Disp: 90 tablet, Rfl: 3 .  terbinafine (LAMISIL) 250 MG tablet, Take 1 tablet (250 mg total) by mouth daily., Disp: 90 tablet, Rfl: 0  EXAM:  Vitals:   03/10/17 1521  BP: 130/90  Pulse: 88  Temp: 98.3 F (36.8 C)    Body mass index is 41.65 kg/m.  GENERAL: vitals reviewed and listed above, alert, oriented, appears well hydrated and in no acute distress  HEENT: atraumatic, conjunttiva clear, no obvious abnormalities on inspection of external nose and ears  NECK: no obvious masses on inspection  LUNGS: clear to auscultation bilaterally, no wheezes, rales or rhonchi, good air movement  CV: HRRR, no peripheral edema  MS: moves all extremities without noticeable abnormality  PSYCH: pleasant and cooperative,  no obvious depression or anxiety  ASSESSMENT AND PLAN:  Discussed the following assessment and plan:  Essential hypertension  Hyperlipidemia, unspecified hyperlipidemia type  Hyperglycemia  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  Patient Instructions  BEFORE YOU LEAVE: -follow up: 3-4 months  Increase lisinopril to 10 mg daily. I sent a new prescription.  Eat a healthy diet. Cut out the sugars and simple starches.  Advise regular aerobic exercise (at least 150 minutes per week of sweaty exercise) and a healthy diet. Try to eat at least 5-9 servings of vegetables and fruits per day (not corn, potatoes or bananas.) Avoid sweets, red meat, pork, butter, fried foods, fast food, processed food, excessive dairy, eggs and coconut. Replace bad fats with good fats - fish, nuts and seeds, canola oil, olive oil.       Colin Benton R., DO

## 2017-03-10 NOTE — Patient Instructions (Addendum)
BEFORE YOU LEAVE: -follow up: 3-4 months  Increase lisinopril to 10 mg daily. I sent a new prescription.  Eat a healthy diet. Cut out the sugars and simple starches.  Advise regular aerobic exercise (at least 150 minutes per week of sweaty exercise) and a healthy diet. Try to eat at least 5-9 servings of vegetables and fruits per day (not corn, potatoes or bananas.) Avoid sweets, red meat, pork, butter, fried foods, fast food, processed food, excessive dairy, eggs and coconut. Replace bad fats with good fats - fish, nuts and seeds, canola oil, olive oil.

## 2017-04-06 ENCOUNTER — Telehealth: Payer: Self-pay | Admitting: *Deleted

## 2017-04-06 ENCOUNTER — Other Ambulatory Visit: Payer: Self-pay | Admitting: Podiatry

## 2017-04-06 NOTE — Telephone Encounter (Signed)
Pt states she has had a life changing experience and kind of let her treatment with Dr. Jacqualyn Posey slide, but completed her medication last week and would like an appt. I informed pt she should make an appt for September to have her toenails evaluated after the medication. Pt states understanding and I transferred to schedulers.

## 2017-05-11 ENCOUNTER — Telehealth: Payer: Self-pay | Admitting: Family Medicine

## 2017-05-11 NOTE — Telephone Encounter (Signed)
PT said pharm is still giving her lisinopril 5 mg instead of 10 mg. Please call [facility] [address]

## 2017-05-11 NOTE — Telephone Encounter (Signed)
I called [facility] and spoke with Ander Purpura and she stated they do have the 10mg  Rx on file from June.  I called the pt and informed her of this and she stated she will contact the pharmacy and call back if needed.

## 2017-05-26 ENCOUNTER — Ambulatory Visit: Payer: BLUE CROSS/BLUE SHIELD | Admitting: Podiatry

## 2017-06-07 ENCOUNTER — Other Ambulatory Visit: Payer: Self-pay | Admitting: Family Medicine

## 2017-06-08 NOTE — Telephone Encounter (Signed)
Last OV 03/10/17, f/u 07/14/17

## 2017-06-19 ENCOUNTER — Other Ambulatory Visit: Payer: Self-pay | Admitting: Family Medicine

## 2017-06-30 ENCOUNTER — Encounter: Payer: BLUE CROSS/BLUE SHIELD | Admitting: Family Medicine

## 2017-07-14 ENCOUNTER — Ambulatory Visit: Payer: BLUE CROSS/BLUE SHIELD | Admitting: Family Medicine

## 2017-09-29 DIAGNOSIS — J209 Acute bronchitis, unspecified: Secondary | ICD-10-CM | POA: Diagnosis not present

## 2017-09-29 DIAGNOSIS — H6693 Otitis media, unspecified, bilateral: Secondary | ICD-10-CM | POA: Diagnosis not present

## 2017-12-21 ENCOUNTER — Other Ambulatory Visit: Payer: Self-pay | Admitting: Family Medicine

## 2018-01-17 ENCOUNTER — Other Ambulatory Visit: Payer: Self-pay | Admitting: Family Medicine

## 2018-02-23 ENCOUNTER — Other Ambulatory Visit: Payer: Self-pay | Admitting: Family Medicine

## 2018-02-25 NOTE — Progress Notes (Signed)
HPI:  Using dictation device. Unfortunately this device frequently misinterprets words/phrases.  Whitney Santiago is a pleasant 57 y.o. here for follow up. Chronic medical problems summarized below were reviewed for changes. She has a history of poor compliance with follow up recommendations and has not been in for follow up in about a year.  Doing well.  No complaints today.  Did have some bronchitis a few weeks ago, this is now resolved.  Admits diet is not great.  No regular exercise.  Has been able to lose a few pounds since a year ago. Denies CP, SOB, DOE, treatment intolerance or new symptoms. Due for labs  HTN: -meds: hctz, lisinopril  HLD/Hyperglycemia/Morbid Obesity: -admitted to very poor diet - lots of sugar, starches -has tried to improve some  No regular exercise- -wt 6/18 235 --> wt 6/19 230  Lymphoma: -sees specialist at [facility] for management    ROS: See pertinent positives and negatives per HPI.  Past Medical History:  Diagnosis Date  . Allergy   . Anxiety    no med  . Depression    no med  . Fibroid   . History of migraine    Hx - last migraine 2 yrs ago, no longer a problem  . Hodgkin disease ([place]) 1999   lymphocytic predominant Hodkin's 2000, s/p 4 weeks Rituxan, chlorambucil 10/21/99-04/19/00  . Hx of iron deficiency anemia    Hx -per [facility] notes, since childhood, possible thalassemia  . Hyperglycemia   . Hyperlipemia    no med  . Hypertension   . LAD (lymphadenopathy), axillary    eval with onc and [facility] 2016  . Obesity   . STD (sexually transmitted disease)    gonorrhea about 30 years ago  . SVD (spontaneous vaginal delivery)    x 2    Past Surgical History:  Procedure Laterality Date  . AXILLARY LYMPH NODE BIOPSY Right 06/09/2016   Procedure: AXILLARY LYMPH NODE BIOPSY;  Surgeon: Jackolyn Confer, MD;  Location: [place];  Service: General;  Laterality: Right;  . BREAST EXCISIONAL BIOPSY Left 2006  . BREAST SURGERY Left    due to bloody discharge  .  COLONOSCOPY    . DILATATION & CURETTAGE/HYSTEROSCOPY WITH MYOSURE N/A 05/24/2016   Procedure: DILATATION & CURETTAGE/HYSTEROSCOPY WITH MYOSURE;  Surgeon: Salvadore Dom, MD;  Location: [facility];  Service: Gynecology;  Laterality: N/A;  . laparascop    . LAPAROSCOPIC REMOVAL ABDOMINAL MASS  2000   LLQ    Family History  Problem Relation Age of Onset  . Hypertension Mother   . Stroke Mother   . Hypertension Father   . Diabetes Father   . Hypertension Sister   . Hypertension Brother   . Hypertension Daughter   . Breast cancer Neg Hx     SOCIAL HX: see hpi   Current Outpatient Medications:  .  Calcium Carb-Cholecalciferol (CALCIUM + D3 PO), Take 1 tablet by mouth daily. , Disp: , Rfl:  .  Cholecalciferol (VITAMIN D3) 2000 UNITS TABS, Take 2,000 Units by mouth daily. , Disp: , Rfl:  .  hydrochlorothiazide (HYDRODIURIL) 25 MG tablet, TAKE 1 TABLET BY MOUTH EVERY DAY *NEED APPOINTMENT FOR REFILLS*, Disp: 30 tablet, Rfl: 0 .  lisinopril (PRINIVIL,ZESTRIL) 10 MG tablet, Take 1 tablet (10 mg total) by mouth daily., Disp: 90 tablet, Rfl: 3  EXAM:  Vitals:   02/26/18 0824  BP: 102/80  Pulse: 89  Temp: (!) 97.5 F (36.4 C)    Body mass index is 40.9 kg/m.  GENERAL: vitals reviewed and listed above, alert, oriented, appears well hydrated and in no acute distress  HEENT: atraumatic, conjunttiva clear, no obvious abnormalities on inspection of external nose and ears  NECK: no obvious masses on inspection  LUNGS: clear to auscultation bilaterally, no wheezes, rales or rhonchi, good air movement  CV: HRRR, no peripheral edema  MS: moves all extremities without noticeable abnormality  PSYCH: pleasant and cooperative, no obvious depression or anxiety  ASSESSMENT AND PLAN:  Discussed the following assessment and plan:  Hyperglycemia - Plan: Hemoglobin A1c  Hyperlipidemia, unspecified hyperlipidemia type - Plan: HDL cholesterol, Cholesterol, total  Essential hypertension -  Plan: Basic metabolic panel, CBC with Differential/Platelet  Morbid obesity (HCC)  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 It can take up to 1-2 weeks for results and processing. IF results require follow up or explanation, we will call you with instructions. Clinically stable results will be released to your [facility]. If you have not heard from [place] or cannot find your results in [facility] in 2 weeks please contact our office at [phone].  If you are not yet signed up for [facility], please consider signing up.   We recommend the following healthy lifestyle for LIFE: 1) Small portions. But, make sure to get regular (at least 3 per day), healthy meals and small healthy snacks if needed.  2) Eat a healthy clean diet.   TRY TO EAT: -at least 5-7 servings of low sugar, colorful, and nutrient rich vegetables per day (not corn, potatoes or bananas.) -berries are the best choice if you wish to eat fruit (only eat small amounts if trying to reduce weight)  -lean meets (fish, white meat of chicken or [place]) -vegan proteins for some meals - beans or tofu, whole grains, nuts and seeds -Replace bad fats with good fats - good fats include: fish, nuts and seeds, canola oil, olive oil -small amounts of low fat or non fat dairy -small amounts of100 % whole grains - check the lables -drink plenty of water  AVOID: -SUGAR, sweets, anything with added sugar, corn syrup or sweeteners - must read labels as even foods advertised as "healthy" often are loaded with sugar -if you must have a sweetener, small amounts of stevia may be  best -sweetened beverages and artificially sweetened beverages -simple starches (rice, bread, potatoes, pasta, chips, etc - small amounts of 100% whole grains are ok) -red meat, pork, butter -fried foods, fast food, processed food, excessive dairy, eggs and coconut.  3)Get at least 150 minutes of sweaty aerobic exercise per week.  4)Reduce stress - consider counseling, meditation and relaxation to balance other aspects of your life.          Lucretia Kern, DO

## 2018-02-26 ENCOUNTER — Telehealth: Payer: Self-pay | Admitting: Oncology

## 2018-02-26 ENCOUNTER — Ambulatory Visit: Payer: BLUE CROSS/BLUE SHIELD | Admitting: Family Medicine

## 2018-02-26 ENCOUNTER — Encounter: Payer: Self-pay | Admitting: Family Medicine

## 2018-02-26 ENCOUNTER — Other Ambulatory Visit: Payer: Self-pay | Admitting: Family Medicine

## 2018-02-26 VITALS — BP 102/80 | HR 89 | Temp 97.5°F | Ht 63.0 in | Wt 230.9 lb

## 2018-02-26 DIAGNOSIS — R739 Hyperglycemia, unspecified: Secondary | ICD-10-CM

## 2018-02-26 DIAGNOSIS — C8103 Nodular lymphocyte predominant Hodgkin lymphoma, intra-abdominal lymph nodes: Secondary | ICD-10-CM

## 2018-02-26 DIAGNOSIS — I10 Essential (primary) hypertension: Secondary | ICD-10-CM | POA: Diagnosis not present

## 2018-02-26 DIAGNOSIS — E785 Hyperlipidemia, unspecified: Secondary | ICD-10-CM

## 2018-02-26 DIAGNOSIS — Z1231 Encounter for screening mammogram for malignant neoplasm of breast: Secondary | ICD-10-CM

## 2018-02-26 LAB — CBC WITH DIFFERENTIAL/PLATELET
Basophils Absolute: 0 10*3/uL (ref 0.0–0.1)
Basophils Relative: 0.3 % (ref 0.0–3.0)
EOS ABS: 0.1 10*3/uL (ref 0.0–0.7)
Eosinophils Relative: 0.7 % (ref 0.0–5.0)
HCT: 36.8 % (ref 36.0–46.0)
HEMOGLOBIN: 12.2 g/dL (ref 12.0–15.0)
Lymphocytes Relative: 25.1 % (ref 12.0–46.0)
Lymphs Abs: 2.5 10*3/uL (ref 0.7–4.0)
MCHC: 33 g/dL (ref 30.0–36.0)
MCV: 85.1 fl (ref 78.0–100.0)
MONO ABS: 0.6 10*3/uL (ref 0.1–1.0)
Monocytes Relative: 6.4 % (ref 3.0–12.0)
Neutro Abs: 6.8 10*3/uL (ref 1.4–7.7)
Neutrophils Relative %: 67.5 % (ref 43.0–77.0)
Platelets: 461 10*3/uL — ABNORMAL HIGH (ref 150.0–400.0)
RBC: 4.33 Mil/uL (ref 3.87–5.11)
RDW: 15.4 % (ref 11.5–15.5)
WBC: 10 10*3/uL (ref 4.0–10.5)

## 2018-02-26 LAB — BASIC METABOLIC PANEL
BUN: 18 mg/dL (ref 6–23)
CO2: 30 mEq/L (ref 19–32)
CREATININE: 0.74 mg/dL (ref 0.40–1.20)
Calcium: 10.3 mg/dL (ref 8.4–10.5)
Chloride: 99 mEq/L (ref 96–112)
GFR: 104.75 mL/min (ref >60.00)
Glucose, Bld: 140 mg/dL — ABNORMAL HIGH (ref 70–99)
POTASSIUM: 3.7 meq/L (ref 3.5–5.1)
Sodium: 138 mEq/L (ref 135–145)

## 2018-02-26 LAB — HDL CHOLESTEROL: HDL: 46.2 mg/dL (ref >39.00)

## 2018-02-26 LAB — HEMOGLOBIN A1C: HEMOGLOBIN A1C: 6.6 % — AB (ref 4.6–6.5)

## 2018-02-26 LAB — CHOLESTEROL, TOTAL: CHOLESTEROL: 212 mg/dL — AB (ref 0–200)

## 2018-02-26 NOTE — Patient Instructions (Signed)
BEFORE YOU LEAVE: -Labs -follow up: 3 to 4 months  We have ordered labs or studies at this visit. It can take up to 1-2 weeks for results and processing. IF results require follow up or explanation, we will call you with instructions. Clinically stable results will be released to your [facility]. If you have not heard from [place] or cannot find your results in [facility] in 2 weeks please contact our office at [phone].  If you are not yet signed up for [facility], please consider signing up.   We recommend the following healthy lifestyle for LIFE: 1) Small portions. But, make sure to get regular (at least 3 per day), healthy meals and small healthy snacks if needed.  2) Eat a healthy clean diet.   TRY TO EAT: -at least 5-7 servings of low sugar, colorful, and nutrient rich vegetables per day (not corn, potatoes or bananas.) -berries are the best choice if you wish to eat fruit (only eat small amounts if trying to reduce weight)  -lean meets (fish, white meat of chicken or [place]) -vegan proteins for some meals - beans or tofu, whole grains, nuts and seeds -Replace bad fats with good fats - good fats include: fish, nuts and seeds, canola oil, olive oil -small amounts of low fat or non fat dairy -small amounts of100 % whole grains - check the lables -drink plenty of water  AVOID: -SUGAR, sweets, anything with added sugar, corn syrup or sweeteners - must read labels as even foods advertised as "healthy" often are loaded with sugar -if you must have a sweetener, small amounts of stevia may be best -sweetened beverages and artificially sweetened beverages -simple starches (rice, bread, potatoes, pasta, chips, etc - small amounts of 100% whole grains are ok) -red meat, pork, butter -fried foods, fast food, processed food, excessive dairy, eggs and coconut.  3)Get at least 150 minutes of sweaty aerobic exercise per week.  4)Reduce stress - consider counseling, meditation and relaxation to balance  other aspects of your life.

## 2018-02-26 NOTE — Telephone Encounter (Signed)
Patient called in to reschedule her appointment from 06/2016

## 2018-02-28 ENCOUNTER — Inpatient Hospital Stay: Payer: BLUE CROSS/BLUE SHIELD | Attending: Oncology | Admitting: Oncology

## 2018-02-28 ENCOUNTER — Telehealth: Payer: Self-pay

## 2018-02-28 VITALS — BP 138/86 | HR 83 | Temp 98.6°F | Resp 18 | Ht 63.0 in | Wt 229.5 lb

## 2018-02-28 DIAGNOSIS — C8103 Nodular lymphocyte predominant Hodgkin lymphoma, intra-abdominal lymph nodes: Secondary | ICD-10-CM | POA: Diagnosis not present

## 2018-02-28 NOTE — Progress Notes (Signed)
Hematology and Oncology Follow Up Visit  Whitney Santiago [id number] 07/16/1961 57 y.o. 02/28/2018 9:16 AM Whitney Santiago, Whitney Santiago, Whitney Major, DO   Principle Diagnosis: 57 year old woman  with lymphocyte predominant Hodgkin's lymphoma in 2000.  She was treated initially and achieved complete response in 2001 and subsequently developed relapse in 2017.    Prior Therapy: She was treated at [facility] with rituximab concomitantly with chlorambucil orally. She appeared to have a good response and remained disease free between 2001 and 2015.  Current therapy: Active surveillance.  Interim History: Ms. Cardin presents today for a follow-up visit.  Since the last visit, she reports no complaints or changes in her health.  She remains active and continues to work 2 full-time jobs.  She has failed to follow-up on multiple occasions but remained asymptomatic from her diagnoses.  She denies any lymphadenopathy, weight loss or changes in her performance status.  She does not report any headaches blurry vision, double vision or syncope.  She denies any alteration of mental status or confusion.  She does not report any fevers, chills, sweats, weight loss. She did not report any chest pain, palpitation, orthopnea, edema. She does not report any shortness of breath, dyspnea on exertion, wheezing or cough. She does not report any nausea, vomiting, hematochezia, melena, or changes in her bowels.  She reported frequency, urgency, hesitancy.  She denies any arthralgias or myalgias.  She denies any easy bruising or bleeding. Rest of her review of systems is negative.  Medications: I have reviewed the patient's current medications.  Current Outpatient Medications  Medication Sig Dispense Refill  . Calcium Carb-Cholecalciferol (CALCIUM + D3 PO) Take 1 tablet by mouth daily.     . Cholecalciferol (VITAMIN D3) 2000 UNITS TABS Take 2,000 Units by mouth daily.     . hydrochlorothiazide (HYDRODIURIL) 25 MG  tablet TAKE 1 TABLET BY MOUTH EVERY DAY *NEED APPOINTMENT FOR REFILLS* 30 tablet 0  . lisinopril (PRINIVIL,ZESTRIL) 10 MG tablet Take 1 tablet (10 mg total) by mouth daily. 90 tablet 3   No current facility-administered medications for this visit.      Allergies:  Allergies  Allergen Reactions  . Hydrocodone Hives  . Oxycodone Hives    Past Medical History, Surgical history, Social history, and Family History reviewed and remain unchanged.   Physical Exam: Blood pressure 138/86, pulse 83, temperature 98.6 F (37 C), temperature source Oral, resp. rate 18, height 5\' 3"  (1.6 m), weight 229 lb 8 oz (104.1 kg), last menstrual period 10/08/2013, SpO2 98 %.   ECOG: 0 General appearance: Well-appearing woman without distress. Head: Atraumatic without abnormalities. Eyes: No scleral icterus.  Pupils are equal and round reactive to light. Oropharynx: Without any thrush or ulcers. Lymph nodes: No lymphadenopathy palpated in the cervical, supraclavicular, or axillary nodes  Heart: Regular rate without any murmurs or gallops.  No lower extremity edema. Lung: Clear to auscultation without any rhonchi, wheezes or dullness to percussion. Abdomin: Soft, nontender without any rebound or guarding. Skin skeletal: No joint deformity or effusion.   Lab Results: Lab Results  Component Value Date   WBC 10.0 02/26/2018   HGB 12.2 02/26/2018   HCT 36.8 02/26/2018   MCV 85.1 02/26/2018   PLT 461.0 (H) 02/26/2018     Chemistry      Component Value Date/Time   NA 138 02/26/2018 0857   NA 140 06/23/2016 0855   K 3.7 02/26/2018 0857   K 3.8 06/23/2016 0855   CL 99 02/26/2018 0857  CO2 30 02/26/2018 0857   CO2 28 06/23/2016 0855   BUN 18 02/26/2018 0857   BUN 12.2 06/23/2016 0855   CREATININE 0.74 02/26/2018 0857   CREATININE 0.9 06/23/2016 0855      Component Value Date/Time   CALCIUM 10.3 02/26/2018 0857   CALCIUM 10.2 06/23/2016 0855   ALKPHOS 94 02/27/2017 1358   ALKPHOS 91  06/23/2016 0855   AST 17 02/27/2017 1358   AST 17 06/23/2016 0855   ALT 14 02/27/2017 1358   ALT 15 06/23/2016 0855   BILITOT <0.2 02/27/2017 1358   BILITOT 0.38 06/23/2016 0855       IMPRESSION: 1. Bilateral axillary hypermetabolic adenopathy, more extensive on the right. Appearance is worrisome for recurrent lymphoma. Tissue sampling recommended. 2. No other hypermetabolic nodal activity within the neck, chest, abdomen or pelvis. Nonspecific activity within the lymphoid tissue of Waldeyer's ring. 3. Uterine fibroids or adenomyosis.   Impression and Plan:   57 year old woman with:  1. Lymphocyte predominant Hodgkin's lymphoma diagnosed in 2000.  She relapsed in 2016 with documented lymphadenopathy on a PET scan in March 2016 and in October 2017.  Lymph node biopsy confirmed these findings September 2017.  The natural course of this disease was reviewed again with the patient who has been notoriously noncompliant with follow-up.  He is completely asymptomatic from these findings and options of therapy were reviewed.  These were include continued active surveillance versus active treatment utilizing rituximab or chemotherapy with rituximab.  After discussion today, she is opted to proceed with active surveillance at this time and intervene if she is symptomatic.  I recommended continued close surveillance and intervention if she develops any symptoms.  2.  Anemia: Has resolved at this time and hemoglobin on February 26, 2018 was within normal range.  3. Follow-up: We will be in the next 6 months or so to follow her progress.  15  minutes was spent with the patient face-to-face today.  More than 50% of time was dedicated to patient counseling, education and coordination of her care.   [name], MD 6/12/20199:16 AM

## 2018-02-28 NOTE — Telephone Encounter (Signed)
Call and left a voice mail for the patient concerning her upcoming appointment. Per 6/12 los. Will also mail a calender and letter to patient.

## 2018-03-16 ENCOUNTER — Other Ambulatory Visit: Payer: Self-pay | Admitting: Family Medicine

## 2018-03-20 ENCOUNTER — Other Ambulatory Visit: Payer: Self-pay | Admitting: Family Medicine

## 2018-03-26 ENCOUNTER — Ambulatory Visit
Admission: RE | Admit: 2018-03-26 | Discharge: 2018-03-26 | Disposition: A | Discharge status: Home / Self Care | Payer: BLUE CROSS/BLUE SHIELD | Source: Ambulatory Visit | Attending: Family Medicine | Admitting: Family Medicine

## 2018-03-26 DIAGNOSIS — Z1231 Encounter for screening mammogram for malignant neoplasm of breast: Secondary | ICD-10-CM

## 2018-06-25 ENCOUNTER — Encounter: Payer: Self-pay | Admitting: Family Medicine

## 2018-06-25 ENCOUNTER — Ambulatory Visit: Payer: BLUE CROSS/BLUE SHIELD | Admitting: Family Medicine

## 2018-06-25 VITALS — BP 118/72 | HR 76 | Temp 98.3°F | Ht 63.0 in | Wt 228.6 lb

## 2018-06-25 DIAGNOSIS — C8103 Nodular lymphocyte predominant Hodgkin lymphoma, intra-abdominal lymph nodes: Secondary | ICD-10-CM

## 2018-06-25 DIAGNOSIS — R739 Hyperglycemia, unspecified: Secondary | ICD-10-CM | POA: Diagnosis not present

## 2018-06-25 DIAGNOSIS — E785 Hyperlipidemia, unspecified: Secondary | ICD-10-CM | POA: Diagnosis not present

## 2018-06-25 DIAGNOSIS — I10 Essential (primary) hypertension: Secondary | ICD-10-CM | POA: Diagnosis not present

## 2018-06-25 DIAGNOSIS — Z23 Encounter for immunization: Secondary | ICD-10-CM | POA: Diagnosis not present

## 2018-06-25 LAB — BASIC METABOLIC PANEL
BUN: 16 mg/dL (ref 6–23)
CALCIUM: 10 mg/dL (ref 8.4–10.5)
CO2: 27 mEq/L (ref 19–32)
Chloride: 100 mEq/L (ref 96–112)
Creatinine, Ser: 0.84 mg/dL (ref 0.40–1.20)
GFR: 90.38 mL/min (ref >60.00)
Glucose, Bld: 128 mg/dL — ABNORMAL HIGH (ref 70–99)
Potassium: 3.9 mEq/L (ref 3.5–5.1)
Sodium: 136 mEq/L (ref 135–145)

## 2018-06-25 LAB — CBC
HCT: 37.8 % (ref 36.0–46.0)
Hemoglobin: 12.6 g/dL (ref 12.0–15.0)
MCHC: 33.3 g/dL (ref 30.0–36.0)
MCV: 85 fl (ref 78.0–100.0)
Platelets: 422 10*3/uL — ABNORMAL HIGH (ref 150.0–400.0)
RBC: 4.45 Mil/uL (ref 3.87–5.11)
RDW: 14.9 % (ref 11.5–15.5)
WBC: 9.1 10*3/uL (ref 4.0–10.5)

## 2018-06-25 LAB — HEMOGLOBIN A1C: HEMOGLOBIN A1C: 6.5 % (ref 4.6–6.5)

## 2018-06-25 NOTE — Patient Instructions (Addendum)
BEFORE YOU LEAVE: -flu shot -labs -follow up: 3-4 months  We have ordered labs or studies at this visit. It can take up to 1-2 weeks for results and processing. IF results require follow up or explanation, we will call you with instructions. Clinically stable results will be released to your [facility]. If you have not heard from [place] or cannot find your results in [facility] in 2 weeks please contact our office at [phone].  If you are not yet signed up for [facility], please consider signing up.   We recommend the following healthy lifestyle for LIFE: GOAL: healthy diet, 150 minutes aerobic exercise per week, 10 lb weight reduction in 3-4 months 1) Small portions. But, make sure to get regular (at least 3 per day), healthy meals and small healthy snacks if needed.  2) Eat a healthy clean diet.   TRY TO EAT: -at least 5-7 servings of low sugar, colorful, and nutrient rich vegetables per day (not corn, potatoes or bananas.) -berries are the best choice if you wish to eat fruit (only eat small amounts if trying to reduce weight)  -lean meets (fish, white meat of chicken or [place]) -vegan proteins for some meals - beans or tofu, whole grains, nuts and seeds -Replace bad fats with good fats - good fats include: fish, nuts and seeds, canola oil, olive oil -small amounts of low fat or non fat dairy -small amounts of100 % whole grains - check the lables -drink plenty of water  AVOID: -SUGAR, sweets, anything with added sugar, corn syrup or sweeteners - must read labels as even foods advertised as "healthy" often are loaded with sugar -if you must have a sweetener, small amounts of stevia may be best -sweetened beverages and artificially sweetened beverages -simple starches (rice, bread, potatoes, pasta, chips, etc - small amounts of 100% whole grains are ok) -red meat, pork, butter -fried foods, fast food, processed food, excessive dairy, eggs and coconut.  3)Get at least 150 minutes of sweaty  aerobic exercise per week.  4)Reduce stress - consider counseling, meditation and relaxation to balance other aspects of your life. e

## 2018-06-25 NOTE — Addendum Note (Signed)
Addended by: Agnes Lawrence on: 06/25/2018 09:55 AM   Modules accepted: Orders

## 2018-06-25 NOTE — Progress Notes (Signed)
HPI:  Using dictation device. Unfortunately this device frequently misinterprets words/phrases.  Whitney Santiago is a pleasant 57 y.o. here for follow up. Chronic medical problems summarized below were reviewed for changes and stability and were updated as needed below. These issues and their treatment remain stable for the most part.  Reports doing well for the most part.  Trying to eat a little healthier.  Trying to get some exercise, but not getting enough. Saw the oncologist.  Reports she will see them every 6 months. Denies CP, SOB, DOE, treatment intolerance or new symptoms.  HTN: -meds: hctz, lisinopril   HLD/Hyperglycemia/Morbid Obesity: -admitted to very poor diet - lots of sugar, starches -has tried to improve some  No regular exercise- -wt 6/18 235 --> wt 6/19 230 --> 228 (10/19)  Lymphoma/Anemia: -saw specialist at [facility] for management -Now seeing Dr. Alen Blew in 2019 for surveillance   ROS: See pertinent positives and negatives per HPI.  Past Medical History:  Diagnosis Date  . Allergy   . Anxiety    no med  . Depression    no med  . Fibroid   . History of migraine    Hx - last migraine 2 yrs ago, no longer a problem  . Hodgkin disease ([place]) 1999   lymphocytic predominant Hodkin's 2000, s/p 4 weeks Rituxan, chlorambucil 10/21/99-04/19/00  . Hx of iron deficiency anemia    Hx -per [facility] notes, since childhood, possible thalassemia  . Hyperglycemia   . Hyperlipemia    no med  . Hypertension   . LAD (lymphadenopathy), axillary    eval with onc and [facility] 2016  . Obesity   . STD (sexually transmitted disease)    gonorrhea about 30 years ago  . SVD (spontaneous vaginal delivery)    x 2    Past Surgical History:  Procedure Laterality Date  . AXILLARY LYMPH NODE BIOPSY Right 06/09/2016   Procedure: AXILLARY LYMPH NODE BIOPSY;  Surgeon: Jackolyn Confer, MD;  Location: [place];  Service: General;  Laterality: Right;  . BREAST EXCISIONAL BIOPSY Left 2006  . BREAST  SURGERY Left    due to bloody discharge  . COLONOSCOPY    . DILATATION & CURETTAGE/HYSTEROSCOPY WITH MYOSURE N/A 05/24/2016   Procedure: DILATATION & CURETTAGE/HYSTEROSCOPY WITH MYOSURE;  Surgeon: Salvadore Dom, MD;  Location: [facility];  Service: Gynecology;  Laterality: N/A;  . laparascop    . LAPAROSCOPIC REMOVAL ABDOMINAL MASS  2000   LLQ    Family History  Problem Relation Age of Onset  . Hypertension Mother   . Stroke Mother   . Hypertension Father   . Diabetes Father   . Hypertension Sister   . Hypertension Brother   . Hypertension Daughter   . Breast cancer Neg Hx     SOCIAL HX: See HPI   Current Outpatient Medications:  .  Calcium Carb-Cholecalciferol (CALCIUM + D3 PO), Take 1 tablet by mouth daily. , Disp: , Rfl:  .  Cholecalciferol (VITAMIN D3) 2000 UNITS TABS, Take 2,000 Units by mouth daily. , Disp: , Rfl:  .  hydrochlorothiazide (HYDRODIURIL) 25 MG tablet, Take 1 tablet (25 mg total) by mouth daily., Disp: 30 tablet, Rfl: 5 .  lisinopril (PRINIVIL,ZESTRIL) 10 MG tablet, TAKE 1 TABLET BY MOUTH EVERY DAY, Disp: 30 tablet, Rfl: 5  EXAM:  Vitals:   06/25/18 0902  BP: 118/72  Pulse: 76  Temp: 98.3 F (36.8 C)    Body mass index is 40.49 kg/m.  GENERAL: vitals reviewed and listed above,  alert, oriented, appears well hydrated and in no acute distress  HEENT: atraumatic, conjunttiva clear, no obvious abnormalities on inspection of external nose and ears  NECK: no obvious masses on inspection  LUNGS: clear to auscultation bilaterally, no wheezes, rales or rhonchi, good air movement  CV: HRRR, no peripheral edema  MS: moves all extremities without noticeable abnormality  PSYCH: pleasant and cooperative, no obvious depression or anxiety  ASSESSMENT AND PLAN:  Discussed the following assessment and plan:  Hyperglycemia  Essential hypertension  Hyperlipidemia, unspecified hyperlipidemia type  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 It can take up to 1-2 weeks for results and processing. IF results require follow up or explanation, we will call you with instructions. Clinically stable results will be released to your [facility]. If you have not heard from [place] or cannot find your results in [facility] in 2 weeks please contact our office at [phone].  If you are not yet signed up for [facility], please consider signing up.   We recommend the following healthy lifestyle for LIFE: GOAL: healthy diet, 150 minutes aerobic exercise per week, 10 lb weight reduction in 3-4 months 1) Small portions. But, make sure to get regular (at least 3 per day), healthy meals and small healthy snacks if needed.  2) Eat a healthy clean diet.   TRY TO EAT: -at least 5-7 servings of low sugar, colorful, and nutrient rich vegetables per day (not corn, potatoes or bananas.) -berries are the best choice if you wish to eat fruit (only eat small amounts if trying to reduce weight)  -lean meets (fish, white meat of chicken or [place]) -vegan proteins for some meals - beans or tofu, whole grains, nuts and seeds -Replace bad fats with good fats - good fats include: fish, nuts and seeds, canola oil, olive oil -small amounts of low fat or non fat dairy -small amounts of100 % whole grains - check the lables -drink plenty of  water  AVOID: -SUGAR, sweets, anything with added sugar, corn syrup or sweeteners - must read labels as even foods advertised as "healthy" often are loaded with sugar -if you must have a sweetener, small amounts of stevia may be best -sweetened beverages and artificially sweetened beverages -simple starches (rice, bread, potatoes, pasta, chips, etc - small amounts of 100% whole grains are ok) -red meat, pork, butter -fried foods, fast food, processed food, excessive dairy, eggs and coconut.  3)Get at least 150 minutes of sweaty aerobic exercise per week.  4)Reduce stress - consider counseling, meditation and relaxation to balance other aspects of your life. e         Lucretia Kern, DO

## 2018-08-30 ENCOUNTER — Other Ambulatory Visit: Payer: Self-pay | Admitting: Family Medicine

## 2018-09-26 ENCOUNTER — Inpatient Hospital Stay: Payer: BLUE CROSS/BLUE SHIELD | Attending: Family Medicine

## 2018-09-26 ENCOUNTER — Inpatient Hospital Stay: Payer: BLUE CROSS/BLUE SHIELD | Admitting: Oncology

## 2018-10-23 NOTE — Progress Notes (Deleted)
HPI:  Using dictation device. Unfortunately this device frequently misinterprets words/phrases.  Whitney Santiago is a pleasant 58 y.o. here for follow up. Chronic medical problems summarized below were reviewed for changes and stability and were updated as needed below. These issues and their treatment remain stable for the most part. ***. Denies CP, SOB, DOE, treatment intolerance or new symptoms. Due for labs, BMI follow-up  HTN: -meds: hctz, lisinopril   HLD/Hyperglycemia/Morbid Obesity: -admitted to very poor diet - lots of sugar, starches-has tried to improve some  No regular exercise- -wt 6/18 235 --> wt 6/19230 --> 228 (10/19)  Lymphoma/Anemia: -saw specialist at [facility] for management -Now seeing Dr. Alen Blew in 2019 for surveillance  ROS: See pertinent positives and negatives per HPI.  Past Medical History:  Diagnosis Date  . Allergy   . Anxiety    no med  . Depression    no med  . Fibroid   . History of migraine    Hx - last migraine 2 yrs ago, no longer a problem  . Hodgkin disease ([place]) 1999   lymphocytic predominant Hodkin's 2000, s/p 4 weeks Rituxan, chlorambucil 10/21/99-04/19/00  . Hx of iron deficiency anemia    Hx -per [facility] notes, since childhood, possible thalassemia  . Hyperglycemia   . Hyperlipemia    no med  . Hypertension   . LAD (lymphadenopathy), axillary    eval with onc and [facility] 2016  . Obesity   . STD (sexually transmitted disease)    gonorrhea about 30 years ago  . SVD (spontaneous vaginal delivery)    x 2    Past Surgical History:  Procedure Laterality Date  . AXILLARY LYMPH NODE BIOPSY Right 06/09/2016   Procedure: AXILLARY LYMPH NODE BIOPSY;  Surgeon: Jackolyn Confer, MD;  Location: [place];  Service: General;  Laterality: Right;  . BREAST EXCISIONAL BIOPSY Left 2006  . BREAST SURGERY Left    due to bloody discharge  . COLONOSCOPY    . DILATATION & CURETTAGE/HYSTEROSCOPY WITH MYOSURE N/A 05/24/2016   Procedure: DILATATION &  CURETTAGE/HYSTEROSCOPY WITH MYOSURE;  Surgeon: Salvadore Dom, MD;  Location: [facility];  Service: Gynecology;  Laterality: N/A;  . laparascop    . LAPAROSCOPIC REMOVAL ABDOMINAL MASS  2000   LLQ    Family History  Problem Relation Age of Onset  . Hypertension Mother   . Stroke Mother   . Hypertension Father   . Diabetes Father   . Hypertension Sister   . Hypertension Brother   . Hypertension Daughter   . Breast cancer Neg Hx     SOCIAL HX: ***   Current Outpatient Medications:  .  Calcium Carb-Cholecalciferol (CALCIUM + D3 PO), Take 1 tablet by mouth daily. , Disp: , Rfl:  .  Cholecalciferol (VITAMIN D3) 2000 UNITS TABS, Take 2,000 Units by mouth daily. , Disp: , Rfl:  .  hydrochlorothiazide (HYDRODIURIL) 25 MG tablet, TAKE 1 TABLET BY MOUTH EVERY DAY, Disp: 90 tablet, Rfl: 1 .  lisinopril (PRINIVIL,ZESTRIL) 10 MG tablet, TAKE 1 TABLET BY MOUTH EVERY DAY, Disp: 90 tablet, Rfl: 1  EXAM:  There were no vitals filed for this visit.  There is no height or weight on file to calculate BMI.  GENERAL: vitals reviewed and listed above, alert, oriented, appears well hydrated and in no acute distress  HEENT: atraumatic, conjunttiva clear, no obvious abnormalities on inspection of external nose and ears  NECK: no obvious masses on inspection  LUNGS: clear to auscultation bilaterally, no wheezes, rales or rhonchi,  good air movement  CV: HRRR, no peripheral edema  MS: moves all extremities without noticeable abnormality *** PSYCH: pleasant and cooperative, no obvious depression or anxiety  ASSESSMENT AND PLAN:  Discussed the following assessment and plan:  No diagnosis found.  *** -Patient advised to return or notify a doctor immediately if symptoms worsen or persist or new concerns arise.  There are no Patient Instructions on file for this visit.  Lucretia Kern, DO

## 2018-10-25 ENCOUNTER — Ambulatory Visit: Payer: BLUE CROSS/BLUE SHIELD | Admitting: Family Medicine

## 2019-02-22 ENCOUNTER — Other Ambulatory Visit: Payer: Self-pay | Admitting: Family Medicine

## 2019-03-12 ENCOUNTER — Ambulatory Visit: Payer: Self-pay | Admitting: Family Medicine

## 2019-03-12 ENCOUNTER — Encounter: Payer: Self-pay | Admitting: Family Medicine

## 2019-03-12 ENCOUNTER — Ambulatory Visit (INDEPENDENT_AMBULATORY_CARE_PROVIDER_SITE_OTHER): Payer: BC Managed Care – PPO | Admitting: Family Medicine

## 2019-03-12 ENCOUNTER — Other Ambulatory Visit: Payer: Self-pay

## 2019-03-12 DIAGNOSIS — Z7189 Other specified counseling: Secondary | ICD-10-CM

## 2019-03-12 NOTE — Telephone Encounter (Signed)
Clinic RN spoke with patient. 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 The second co-worker tested positive on Sunday and employees were notified. 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 Scheduled patient an appointment with Dr. [name] today at 4:40 PM.

## 2019-03-12 NOTE — Telephone Encounter (Signed)
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   Pt is asymptomatic. States she is immunocompromised. 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  Pt is requesting Covid testing. 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 Would need work note if advise not work.  CB# [phone].  Assisted pt to set up MyChart but requested call back.

## 2019-03-12 NOTE — Progress Notes (Signed)
Virtual Visit via Video Note  I connected with Whitney Santiago  on 03/12/19 at  4:40 PM EDT by a video enabled telemedicine application and verified that I am speaking with the correct person using two identifiers.  Location patient: home Location provider:work or home office Persons participating in the virtual visit: patient, provider  I discussed the limitations of evaluation and management by telemedicine and the availability of in person appointments. The patient expressed understanding and agreed to proceed.   HPI:  Possible COVID19 exposure: -she works at [employer] -a Mudlogger or a Barista tested positive, but they have not told her who had it -she was not on the list of the people whom were contacted because of exposure (she was not in that group) but she is worried -all employees are wearing masks and gloves, but customers do not -she wonders if she should be tested, work did not advise that she be tested as they told her she was not around the positive case -denies fevers, malaise, body aches, congestin, cough, SOB, NVD or any symptoms  ROS: See pertinent positives and negatives per HPI.  Past Medical History:  Diagnosis Date  . Allergy   . Anxiety    no med  . Depression    no med  . Fibroid   . History of migraine    Hx - last migraine 2 yrs ago, no longer a problem  . Hodgkin disease ([place]) 1999   lymphocytic predominant Hodkin's 2000, s/p 4 weeks Rituxan, chlorambucil 10/21/99-04/19/00  . Hx of iron deficiency anemia    Hx -per [facility] notes, since childhood, possible thalassemia  . Hyperglycemia   . Hyperlipemia    no med  . Hypertension   . LAD (lymphadenopathy), axillary    eval with onc and [facility] 2016  . Obesity   . STD (sexually transmitted disease)    gonorrhea about 30 years ago  . SVD (spontaneous vaginal delivery)    x 2    Past Surgical History:  Procedure Laterality Date  . AXILLARY LYMPH NODE BIOPSY Right 06/09/2016   Procedure: AXILLARY LYMPH NODE BIOPSY;   Surgeon: Jackolyn Confer, MD;  Location: [place];  Service: General;  Laterality: Right;  . BREAST EXCISIONAL BIOPSY Left 2006  . BREAST SURGERY Left    due to bloody discharge  . COLONOSCOPY    . DILATATION & CURETTAGE/HYSTEROSCOPY WITH MYOSURE N/A 05/24/2016   Procedure: DILATATION & CURETTAGE/HYSTEROSCOPY WITH MYOSURE;  Surgeon: Salvadore Dom, MD;  Location: [facility];  Service: Gynecology;  Laterality: N/A;  . laparascop    . LAPAROSCOPIC REMOVAL ABDOMINAL MASS  2000   LLQ    Family History  Problem Relation Age of Onset  . Hypertension Mother   . Stroke Mother   . Hypertension Father   . Diabetes Father   . Hypertension Sister   . Hypertension Brother   . Hypertension Daughter   . Breast cancer Neg Hx     SOCIAL HX: see hpi   Current Outpatient Medications:  .  Calcium Carb-Cholecalciferol (CALCIUM + D3 PO), Take 1 tablet by mouth daily. , Disp: , Rfl:  .  Cholecalciferol (VITAMIN D3) 2000 UNITS TABS, Take 2,000 Units by mouth daily. , Disp: , Rfl:  .  hydrochlorothiazide (HYDRODIURIL) 25 MG tablet, TAKE 1 TABLET BY MOUTH EVERY DAY, Disp: 90 tablet, Rfl: 0 .  lisinopril (ZESTRIL) 10 MG tablet, TAKE 1 TABLET BY MOUTH EVERY DAY, Disp: 90 tablet, Rfl: 0  EXAM:  VITALS per patient if applicable:  GENERAL: alert, oriented, appears well and in no acute distress  HEENT: atraumatic, conjunttiva clear, no obvious abnormalities on inspection of external nose and ears  NECK: normal movements of the head and neck  LUNGS: on inspection no signs of respiratory distress, breathing rate appears normal, no obvious gross SOB, gasping or wheezing  CV: no obvious cyanosis  MS: moves all visible extremities without noticeable abnormality  PSYCH/NEURO: pleasant and cooperative, no obvious depression or anxiety, speech and thought processing grossly intact  ASSESSMENT AND PLAN:  Discussed the following assessment and plan:  Counseling on health promotion and disease prevention    Discussed her case. It sounds like she was not in any close contact or around the individual who tested positive. She has been working for 1 week since she found out about this and is not having any symptoms. She opted to continue using a mask, social distancing and hand hygiene and follow up if develops any symptoms or if she has any exposure to someone with COVID19.     I discussed the assessment and treatment plan with the patient. The patient was provided an opportunity to ask questions and all were answered. The patient agreed with the plan and demonstrated an understanding of the instructions.   The patient was advised to call back or seek an in-person evaluation if the symptoms worsen or if the condition fails to improve as anticipated.   Lucretia Kern, DO

## 2019-03-13 ENCOUNTER — Telehealth: Payer: Self-pay | Admitting: *Deleted

## 2019-03-13 NOTE — Telephone Encounter (Signed)
Left a detailed message at the pts cell number for the pt to call back and schedule an appt as below.

## 2019-03-13 NOTE — Telephone Encounter (Signed)
-----   Message from Lucretia Kern, DO sent at 03/12/2019  5:08 PM EDT ----- -regular follow up with Dr. [name] in 1 month

## 2019-03-27 ENCOUNTER — Encounter: Payer: Self-pay | Admitting: Family Medicine

## 2019-03-27 ENCOUNTER — Other Ambulatory Visit: Payer: Self-pay

## 2019-03-27 ENCOUNTER — Ambulatory Visit (INDEPENDENT_AMBULATORY_CARE_PROVIDER_SITE_OTHER): Payer: BC Managed Care – PPO | Admitting: Family Medicine

## 2019-03-27 DIAGNOSIS — I10 Essential (primary) hypertension: Secondary | ICD-10-CM

## 2019-03-27 DIAGNOSIS — E119 Type 2 diabetes mellitus without complications: Secondary | ICD-10-CM

## 2019-03-27 DIAGNOSIS — C8103 Nodular lymphocyte predominant Hodgkin lymphoma, intra-abdominal lymph nodes: Secondary | ICD-10-CM

## 2019-03-27 DIAGNOSIS — E785 Hyperlipidemia, unspecified: Secondary | ICD-10-CM

## 2019-03-27 NOTE — Progress Notes (Signed)
Virtual Visit via Video Note  I connected with Whitney Santiago   on 03/27/19 at 11:30 AM EDT by a video enabled telemedicine application and verified that I am speaking with the correct person using two identifiers.  Location patient: home Location provider:work office Persons participating in the virtual visit: patient, provider  I discussed the limitations of evaluation and management by telemedicine and the availability of in person appointments. The patient expressed understanding and agreed to proceed.   Whitney Santiago DOB: 1961-07-07 Encounter date: 03/27/2019  This is a 58 y.o. female who presents to establish care. No chief complaint on file.  Last visit with [facility] was 03/12/2019. Possible COVID19 exposure at work.   Due for bloodwork. A1C in diabetic range but has been diet controlled.  History of present illness: One of lymph nodes got swollen under right arm. Happens on and off for her and has been this way for years.  Lasts for couple of weeks and then tends to go away.. Not certain if there is any infection there or something that caused them. This one present last couple of weeks. She is due for follow up with Dr. Alen Blew. No fevers, chills, night sweats. Did have some body ache on Monday but went away. This is normal for her.   LPF:XTKW, lisinopril. She does not check pressures at home but good about taking bp medication daily.  HL: tries to eat well at least most of time.   Not been exercising. 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   Hx of hodgkin disease 69; tx at [facility]. Follows up q 6 months with oncology for lymphoma/anemia(Dr. Alen Blew) for surveilance.   Past Medical History:  Diagnosis Date  . Allergy   . Anxiety    no med  . Depression    no med  . Fibroid   . History of migraine    Hx - last migraine 2 yrs ago, no longer a problem  . Hodgkin disease ([place]) 1999   lymphocytic predominant Hodkin's 2000, s/p 4 weeks Rituxan, chlorambucil  10/21/99-04/19/00  . Hx of iron deficiency anemia    Hx -per [facility] notes, since childhood, possible thalassemia  . Hyperglycemia   . Hyperlipemia    no med  . Hypertension   . LAD (lymphadenopathy), axillary    eval with onc and [facility] 2016  . Obesity   . STD (sexually transmitted disease)    gonorrhea about 30 years ago  . SVD (spontaneous vaginal delivery)    x 2   Past Surgical History:  Procedure Laterality Date  . AXILLARY LYMPH NODE BIOPSY Right 06/09/2016   Procedure: AXILLARY LYMPH NODE BIOPSY;  Surgeon: Jackolyn Confer, MD;  Location: [place];  Service: General;  Laterality: Right;  . BREAST EXCISIONAL BIOPSY Left 2006  . BREAST SURGERY Left    due to bloody discharge  . COLONOSCOPY    . DILATATION & CURETTAGE/HYSTEROSCOPY WITH MYOSURE N/A 05/24/2016   Procedure: DILATATION & CURETTAGE/HYSTEROSCOPY WITH MYOSURE;  Surgeon: Salvadore Dom, MD;  Location: [facility];  Service: Gynecology;  Laterality: N/A;  . LAPAROSCOPIC REMOVAL ABDOMINAL MASS  2000   LLQ   Allergies  Allergen Reactions  . Hydrocodone Hives  . Oxycodone Hives   No outpatient medications have been marked as taking for the 03/27/19 encounter (Office Visit) with Caren Macadam, MD.   Social History   Tobacco Use  . Smoking status: Never Smoker  . Smokeless tobacco: Never Used  Substance Use  Topics  . Alcohol use: No    Alcohol/week: 0.0 standard drinks   Family History  Problem Relation Age of Onset  . Hypertension Mother   . Stroke Mother 73  . Hypertension Father   . Diabetes Father   . Hypertension Sister   . Liver disease Sister        related to infected appendix  . Hypertension Brother   . Hypertension Daughter   . Hypertension Sister   . Breast cancer Neg Hx      Review of Systems  Constitutional: Negative for chills, fatigue and fever.  Respiratory: Negative for cough, chest tightness, shortness of breath and wheezing.   Cardiovascular: Negative for chest pain, palpitations and leg  swelling.    Objective:  LMP 10/08/2013       BP Readings from Last 3 Encounters:  06/25/18 118/72  02/28/18 138/86  02/26/18 102/80   Wt Readings from Last 3 Encounters:  06/25/18 228 lb 9.6 oz (103.7 kg)  02/28/18 229 lb 8 oz (104.1 kg)  02/26/18 230 lb 14.4 oz (104.7 kg)    EXAM:  GENERAL: alert, oriented, appears well and in no acute distress  HEENT: atraumatic, conjunctiva clear, no obvious abnormalities on inspection of external nose and ears  NECK: normal movements of the head and neck  LUNGS: on inspection no signs of respiratory distress, breathing rate appears normal, no obvious gross SOB, gasping or wheezing  CV: no obvious cyanosis  MS: moves all visible extremities without noticeable abnormality  PSYCH/NEURO: pleasant and cooperative, no obvious depression or anxiety, speech and thought processing grossly intact  SKIN: No Facial or neck skin abnormalities noted  Assessment/Plan  1. Essential hypertension We will start with blood work.  She will need an office exam at some point since she does not check her blood pressures at home, pressure documented was within normal range. - CBC with Differential/Platelet; Future - Comprehensive metabolic panel; Future  2. Nodular lymphocyte predominant Hodgkin lymphoma of intra-abdominal lymph nodes ([place]) We will get baseline blood work.  Encouraged her to schedule her follow-up appointment with heme-onc but okay to wait to schedule this until after blood work.  If no abnormalities on blood work and lymph node has resolved then urgency of appointment is less.  If Right axillary lymph node remains enlarged then would encourage more urgent referral.  3. Hyperlipidemia, unspecified hyperlipidemia type - Lipid panel; Future - TSH; Future  4. Controlled type 2 diabetes mellitus without complication, without long-term current use of insulin (HCC) Currently diet controlled.  Has difficulty with fitting and exercise to daily  routine.  We will continue to encourage this.  Encouraged low carbohydrate diet. - Hemoglobin A1c; Future - Microalbumin / creatinine urine ratio; Future  Return for bloodwork and CCV pending results.    I discussed the assessment and treatment plan with the patient. The patient was provided an opportunity to ask questions and all were answered. The patient agreed with the plan and demonstrated an understanding of the instructions.   The patient was advised to call back or seek an in-person evaluation if the symptoms worsen or if the condition fails to improve as anticipated.  I provided 32 minutes of non-face-to-face time during this encounter.   Micheline Rough, MD

## 2019-03-28 ENCOUNTER — Telehealth: Payer: Self-pay | Admitting: *Deleted

## 2019-03-28 NOTE — Telephone Encounter (Signed)
I called the pt and scheduled an appt for 7/13 at 8am.

## 2019-03-28 NOTE — Telephone Encounter (Signed)
-----   Message from Caren Macadam, MD sent at 03/27/2019 11:51 AM EDT ----- Please schedule bloodwork visit for her in next week

## 2019-04-01 ENCOUNTER — Other Ambulatory Visit (INDEPENDENT_AMBULATORY_CARE_PROVIDER_SITE_OTHER): Payer: BC Managed Care – PPO

## 2019-04-01 DIAGNOSIS — I10 Essential (primary) hypertension: Secondary | ICD-10-CM

## 2019-04-01 DIAGNOSIS — E785 Hyperlipidemia, unspecified: Secondary | ICD-10-CM | POA: Diagnosis not present

## 2019-04-01 DIAGNOSIS — E119 Type 2 diabetes mellitus without complications: Secondary | ICD-10-CM | POA: Diagnosis not present

## 2019-04-01 LAB — LIPID PANEL
Cholesterol: 198 mg/dL (ref 0–200)
HDL: 50.9 mg/dL (ref >39.00)
LDL Cholesterol: 129 mg/dL — ABNORMAL HIGH (ref 0–99)
NonHDL: 147
Total CHOL/HDL Ratio: 4
Triglycerides: 91 mg/dL (ref 0.0–149.0)
VLDL: 18.2 mg/dL (ref 0.0–40.0)

## 2019-04-01 LAB — COMPREHENSIVE METABOLIC PANEL
ALT: 12 U/L (ref 0–35)
AST: 11 U/L (ref 0–37)
Albumin: 3.9 g/dL (ref 3.5–5.2)
Alkaline Phosphatase: 75 U/L (ref 39–117)
BUN: 20 mg/dL (ref 6–23)
CO2: 26 mEq/L (ref 19–32)
Calcium: 9.4 mg/dL (ref 8.4–10.5)
Chloride: 102 mEq/L (ref 96–112)
Creatinine, Ser: 0.72 mg/dL (ref 0.40–1.20)
GFR: 101.31 mL/min (ref >60.00)
Glucose, Bld: 131 mg/dL — ABNORMAL HIGH (ref 70–99)
Potassium: 3.9 mEq/L (ref 3.5–5.1)
Sodium: 139 mEq/L (ref 135–145)
Total Bilirubin: 0.4 mg/dL (ref 0.2–1.2)
Total Protein: 6.8 g/dL (ref 6.0–8.3)

## 2019-04-01 LAB — CBC WITH DIFFERENTIAL/PLATELET
Basophils Absolute: 0 10*3/uL (ref 0.0–0.1)
Basophils Relative: 0.2 % (ref 0.0–3.0)
Eosinophils Absolute: 0.1 10*3/uL (ref 0.0–0.7)
Eosinophils Relative: 0.7 % (ref 0.0–5.0)
HCT: 36 % (ref 36.0–46.0)
Hemoglobin: 11.9 g/dL — ABNORMAL LOW (ref 12.0–15.0)
Lymphocytes Relative: 35.7 % (ref 12.0–46.0)
Lymphs Abs: 3.8 10*3/uL (ref 0.7–4.0)
MCHC: 33.1 g/dL (ref 30.0–36.0)
MCV: 86.2 fl (ref 78.0–100.0)
Monocytes Absolute: 0.6 10*3/uL (ref 0.1–1.0)
Monocytes Relative: 5.7 % (ref 3.0–12.0)
Neutro Abs: 6.1 10*3/uL (ref 1.4–7.7)
Neutrophils Relative %: 57.7 % (ref 43.0–77.0)
Platelets: 394 10*3/uL (ref 150.0–400.0)
RBC: 4.17 Mil/uL (ref 3.87–5.11)
RDW: 14.5 % (ref 11.5–15.5)
WBC: 10.5 10*3/uL (ref 4.0–10.5)

## 2019-04-01 LAB — MICROALBUMIN / CREATININE URINE RATIO
Creatinine,U: 116.4 mg/dL
Microalb Creat Ratio: 1.2 mg/g (ref 0.0–30.0)
Microalb, Ur: 1.4 mg/dL (ref 0.0–1.9)

## 2019-04-01 LAB — HEMOGLOBIN A1C: Hgb A1c MFr Bld: 6.7 % — ABNORMAL HIGH (ref 4.6–6.5)

## 2019-04-01 LAB — TSH: TSH: 1.18 u[IU]/mL (ref 0.35–4.50)

## 2019-04-05 ENCOUNTER — Ambulatory Visit: Payer: BLUE CROSS/BLUE SHIELD | Admitting: Family Medicine

## 2019-05-20 ENCOUNTER — Other Ambulatory Visit: Payer: Self-pay | Admitting: Family Medicine

## 2019-05-28 ENCOUNTER — Telehealth: Payer: Self-pay | Admitting: *Deleted

## 2019-05-28 NOTE — Telephone Encounter (Signed)
Copied from [place] [phone]. Topic: General - Other >> May 28, 2019  1:40 PM Alanda Slim E wrote: Reason for CRM: Pt wears a mask and it is hard for her to work with her mask on and she would like advise from Dr. Ethlyn Gallery Pt also thinks she has sleep apnea and would like a  referral or testing / please advise and leave a message if Pt doesn't answer

## 2019-05-29 ENCOUNTER — Other Ambulatory Visit: Payer: Self-pay | Admitting: Family Medicine

## 2019-05-29 DIAGNOSIS — Z1231 Encounter for screening mammogram for malignant neoplasm of breast: Secondary | ICD-10-CM

## 2019-05-29 NOTE — Telephone Encounter (Signed)
I think it is important to mask in public due to risk of COVID. Some masks are easier to tolerate than others. You want one that is going to protect you, but sometimes the ones that protect against viral transfer are thicker and harder to breathe with. Could switch out with more breathable fabric mask. At least giving still some boundary. But, I do think you want to try and protect self as much as possible.   I am ok with sleep study referral, but we have to document a few specifics.  1. Snoring? 2. Anyone witness her stop breathing when sleeping? 3. Day time sleepiness? 4 prior sleep study?  5. How long has this been issue?   I think if you document all above and advise to avoid driving if excessive daytime sleepiness then it is ok to order ref sleep study.

## 2019-05-30 NOTE — Telephone Encounter (Signed)
Pt called back.  Per FC send message.

## 2019-05-30 NOTE — Telephone Encounter (Signed)
Left a message for the pt to return my call.  

## 2019-05-30 NOTE — Telephone Encounter (Signed)
Left a detailed message at the pts cell number with the information below and asked that she call back with answers to questions.  CRM created.

## 2019-06-12 ENCOUNTER — Telehealth (INDEPENDENT_AMBULATORY_CARE_PROVIDER_SITE_OTHER): Payer: BC Managed Care – PPO | Admitting: Adult Health

## 2019-06-12 ENCOUNTER — Other Ambulatory Visit: Payer: Self-pay

## 2019-06-12 ENCOUNTER — Encounter: Payer: Self-pay | Admitting: Adult Health

## 2019-06-12 DIAGNOSIS — T6591XA Toxic effect of unspecified substance, accidental (unintentional), initial encounter: Secondary | ICD-10-CM | POA: Diagnosis not present

## 2019-06-12 NOTE — Progress Notes (Signed)
Virtual Visit via Video Note  I connected with Whitney Santiago on 06/12/19 at  7:30 AM EDT by a video enabled telemedicine application and verified that I am speaking with the correct person using two identifiers.  Location patient: home Location provider:work or home office Persons participating in the virtual visit: patient, provider  I discussed the limitations of evaluation and management by telemedicine and the availability of in person appointments. The patient expressed understanding and agreed to proceed.   HPI: 58 year old female who is being evaluated today for possible allergic reaction.  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  She noticed that despite wearing a mask never she uses the cleaner she has shortness of breath and cough for the rest of the day.  When she is at home and not working the register her symptoms resolved.  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  She denies fevers, chills, chest pain   ROS: See pertinent positives and negatives per HPI.  Past Medical History:  Diagnosis Date  . Allergy   . Anxiety    no med  . Depression    no med  . Fibroid   . History of migraine    Hx - last migraine 2 yrs ago, no longer a problem  . 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 Hx of iron deficiency anemia    Hx -per [facility] notes, since childhood, possible thalassemia  . Hyperglycemia   . Hyperlipemia    no med  . Hypertension   . LAD (lymphadenopathy), axillary    eval with onc and [facility] 2016  . Obesity   . STD (sexually transmitted disease)    gonorrhea about 30 years ago  . SVD (spontaneous vaginal delivery)    x 2    Past Surgical History:  Procedure Laterality Date  . AXILLARY LYMPH NODE BIOPSY Right 06/09/2016   Procedure: AXILLARY LYMPH NODE BIOPSY;  Surgeon: Jackolyn Confer, MD;  Location: [place];  Service: General;  Laterality: Right;  . BREAST EXCISIONAL BIOPSY Left 2006  . BREAST SURGERY Left    due to bloody discharge  . COLONOSCOPY    . DILATATION & CURETTAGE/HYSTEROSCOPY WITH MYOSURE N/A 05/24/2016   Procedure: DILATATION & CURETTAGE/HYSTEROSCOPY WITH MYOSURE;  Surgeon: Salvadore Dom, MD;  Location: [facility];  Service: Gynecology;  Laterality: N/A;  . LAPAROSCOPIC REMOVAL ABDOMINAL MASS  2000   LLQ    Family History  Problem Relation Age of Onset  . Hypertension Mother   . Stroke Mother 106  . Hypertension Father   . Diabetes Father   . Hypertension Sister   . Liver disease Sister        related to infected appendix  . Hypertension Brother   . Hypertension Daughter   . Hypertension Sister   . Breast cancer Neg Hx      Current Outpatient Medications:  .  Calcium Carb-Cholecalciferol (CALCIUM + D3 PO), Take 1 tablet by mouth daily. , Disp: , Rfl:  .  Cholecalciferol (VITAMIN D3) 2000 UNITS TABS, Take 2,000 Units by mouth daily. , Disp: , Rfl:  .  hydrochlorothiazide (HYDRODIURIL) 25 MG tablet, TAKE 1 TABLET BY MOUTH EVERY DAY, Disp: 90 tablet, Rfl: 0 .  lisinopril (ZESTRIL) 10 MG tablet, TAKE 1 TABLET BY MOUTH EVERY DAY, Disp: 90 tablet, Rfl:  0  EXAM:  VITALS per patient if applicable:  GENERAL: alert, oriented, appears well and in no acute distress  HEENT: atraumatic, conjunttiva clear, no obvious abnormalities on inspection of external nose and ears  NECK: normal movements of the head and neck  LUNGS: on inspection no signs of respiratory distress, breathing rate appears normal, no obvious gross SOB, gasping or wheezing  CV: no obvious cyanosis  MS: moves all visible extremities without noticeable abnormality  PSYCH/NEURO: pleasant and cooperative, no obvious depression or anxiety, speech and thought processing grossly intact  ASSESSMENT AND PLAN:  Discussed the following assessment and plan:  1. Allergic reaction  to chemical substance, accidental or unintentional, initial encounter - Work note placed at front desk for pick up     I discussed the assessment and treatment plan with the patient. The patient was provided an opportunity to ask questions and all were answered. The patient agreed with the plan and demonstrated an understanding of the instructions.   The patient was advised to call back or seek an in-person evaluation if the symptoms worsen or if the condition fails to improve as anticipated.   Dorothyann Peng, NP

## 2019-06-28 ENCOUNTER — Other Ambulatory Visit: Payer: Self-pay

## 2019-06-28 DIAGNOSIS — Z20828 Contact with and (suspected) exposure to other viral communicable diseases: Secondary | ICD-10-CM | POA: Diagnosis not present

## 2019-06-28 DIAGNOSIS — Z20822 Contact with and (suspected) exposure to covid-19: Secondary | ICD-10-CM

## 2019-06-29 LAB — NOVEL CORONAVIRUS, NAA: SARS-CoV-2, NAA: NOT DETECTED

## 2019-07-07 NOTE — Progress Notes (Signed)
Whitney Santiago DOB: [birth year] Encounter date: 07/08/2019  This is a 58 y.o. female who presents with Chief Complaint  Patient presents with  . Follow-up    History of present illness: This is 3 month follow up. Last visit in July A1C was elevated along with cholesterol. We wanted to work on diet/exercise and then recheck/discuss today. From lab results: -A1C is higher which means sugar is more poorly controlled. Trying to fit in daily exercise and decreasing carbs is really important. We can either set up 3 month repeat A1C and if not improving at that time then discuss additional treatment options; OR if she is interested in treatment options at this point we can set up earlier visit. Meeting with dietician is also option (ok to refer if interested).  -kidney function looks good.  -cholesterol is higher than desired, esp with blood sugars being elevated. We will also need to discuss cholesterol control and I would recommend statin to help prevent heart attack/stroke. We should discuss this further. We can do this at future visit as well.  -slight anemia; otherwise blood counts look ok. If lymph node in armpit not gone, follow up for exam with oncology asap. If resolved can follow up with them as directed.   Has a couple of issues: when she uses bathroom and gets up she has some leakage.Feels like this is getting worse. Just notes when standing up when she thinks she is done. Notes more with working. Enough that she has to wear a pad. Thinks this has been going on for 2 years.   Allergies were flared from spraying at work. 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   Was snoring loudly when she was with her daughter and it made her worry about sleep apnea. Snores off and on. Not feeling rested when she gets up in morning. Daughter told her she was stopping breathing while sleeping. Seemed to really just get bad  when she was using spray at work.   New position at [employer] she is moving around a lot more.   Does have gynecologist. But has only seen one time.   Due for repeat colonoscopy 11/2019.  Has mammogram scheduled next week.    Allergies  Allergen Reactions  . Hydrocodone Hives  . Oxycodone Hives   Current Meds  Medication Sig  . Calcium Carb-Cholecalciferol (CALCIUM + D3 PO) Take 1 tablet by mouth daily.   . Cholecalciferol (VITAMIN D3) 2000 UNITS TABS Take 2,000 Units by mouth daily.   . hydrochlorothiazide (HYDRODIURIL) 25 MG tablet TAKE 1 TABLET BY MOUTH EVERY DAY  . lisinopril (ZESTRIL) 10 MG tablet TAKE 1 TABLET BY MOUTH EVERY DAY    Review of Systems  Constitutional: Negative for chills, fatigue and fever.  HENT: Sinus pressure: improved; see hpi.   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   Cardiovascular: Negative for chest pain, palpitations and leg swelling.  Psychiatric/Behavioral: Positive for sleep disturbance (seehpi).    Objective:  BP 122/80 (BP Location: Left Arm, Patient Position: Sitting, Cuff Size: Large)   Pulse 84   Temp 97.8 F (36.6 C) (Temporal)   Ht 5\' 3"  (1.6 m)   Wt 235 lb (106.6 kg)   LMP 10/08/2013   SpO2 98%   BMI 41.63 kg/m   Weight: 235 lb (106.6 kg)   BP Readings from Last 3 Encounters:  07/08/19  122/80  06/25/18 118/72  02/28/18 138/86   Wt Readings from Last 3 Encounters:  07/08/19 235 lb (106.6 kg)  06/25/18 228 lb 9.6 oz (103.7 kg)  02/28/18 229 lb 8 oz (104.1 kg)    Physical Exam Constitutional:      General: She is not in acute distress.    Appearance: She is well-developed.  Cardiovascular:     Rate and Rhythm: Normal rate and regular rhythm.     Heart sounds: Normal heart sounds. No murmur. No friction rub.  Pulmonary:     Effort: Pulmonary effort is normal. No respiratory distress.     Breath sounds: Normal breath sounds. No wheezing or  rales.  Musculoskeletal:     Right lower leg: No edema.     Left lower leg: No edema.  Neurological:     Mental Status: She is alert and oriented to person, place, and time.  Psychiatric:        Behavior: Behavior normal.     Assessment/Plan 1. Essential hypertension Well controlled, continue current medication. - CBC with Differential/Platelet; Future  2. Hyperlipidemia, unspecified hyperlipidemia type Diet controlled. She has changed eating habits and eats pretty healthy overall (daughter cooks most of meals) - Comprehensive metabolic panel; Future - Lipid panel; Future  3. Controlled type 2 diabetes mellitus without complication, without long-term current use of insulin (HCC) Improved A1C today. Continue to monitor. Recheck in 3 months. We discussed statin addition, but since A1C now below 6.5 we will not start today. - POC HgB A1c - Hemoglobin A1c; Future - Microalbumin / creatinine urine ratio; Future  4. Uterine prolapse Noted on Dr. Gentry Fitz previous note. Encouraged [facility] to schedule follow up for re-eval as this could contribute to current urinary issues.   5. Anemia, unspecified type - IBC + Ferritin; Future - Vitamin B12; Future  6. Urinary incontinence, unspecified type Hematuria; will send for culture. - Urinalysis - POC Urinalysis Dipstick  7. Snoring - Ambulatory referral to Sleep Studies  8. Hematuria, unspecified type See above.    Return in about 3 months (around 10/08/2019) for Chronic condition visit with labs prior to visit.     Micheline Rough, MD

## 2019-07-08 ENCOUNTER — Other Ambulatory Visit: Payer: Self-pay

## 2019-07-08 ENCOUNTER — Ambulatory Visit (INDEPENDENT_AMBULATORY_CARE_PROVIDER_SITE_OTHER): Payer: BC Managed Care – PPO | Admitting: Family Medicine

## 2019-07-08 ENCOUNTER — Encounter: Payer: Self-pay | Admitting: Family Medicine

## 2019-07-08 VITALS — BP 122/80 | HR 84 | Temp 97.8°F | Ht 63.0 in | Wt 235.0 lb

## 2019-07-08 DIAGNOSIS — E119 Type 2 diabetes mellitus without complications: Secondary | ICD-10-CM

## 2019-07-08 DIAGNOSIS — D649 Anemia, unspecified: Secondary | ICD-10-CM

## 2019-07-08 DIAGNOSIS — R32 Unspecified urinary incontinence: Secondary | ICD-10-CM | POA: Diagnosis not present

## 2019-07-08 DIAGNOSIS — N814 Uterovaginal prolapse, unspecified: Secondary | ICD-10-CM

## 2019-07-08 DIAGNOSIS — Z23 Encounter for immunization: Secondary | ICD-10-CM | POA: Diagnosis not present

## 2019-07-08 DIAGNOSIS — E785 Hyperlipidemia, unspecified: Secondary | ICD-10-CM | POA: Diagnosis not present

## 2019-07-08 DIAGNOSIS — I10 Essential (primary) hypertension: Secondary | ICD-10-CM

## 2019-07-08 DIAGNOSIS — R0683 Snoring: Secondary | ICD-10-CM

## 2019-07-08 DIAGNOSIS — R319 Hematuria, unspecified: Secondary | ICD-10-CM

## 2019-07-08 LAB — POCT URINALYSIS DIPSTICK
Bilirubin, UA: NEGATIVE
Glucose, UA: NEGATIVE
Ketones, UA: NEGATIVE
Leukocytes, UA: NEGATIVE
Nitrite, UA: NEGATIVE
Protein, UA: NEGATIVE
Spec Grav, UA: 1.02 (ref 1.010–1.025)
Urobilinogen, UA: 0.2 E.U./dL
pH, UA: 6 (ref 5.0–8.0)

## 2019-07-08 LAB — POCT GLYCOSYLATED HEMOGLOBIN (HGB A1C): Hemoglobin A1C: 6.4 % — AB (ref 4.0–5.6)

## 2019-07-08 LAB — URINALYSIS, ROUTINE W REFLEX MICROSCOPIC
Bilirubin Urine: NEGATIVE
Ketones, ur: NEGATIVE
Leukocytes,Ua: NEGATIVE
Nitrite: NEGATIVE
Specific Gravity, Urine: 1.02 (ref 1.000–1.030)
Total Protein, Urine: NEGATIVE
Urine Glucose: NEGATIVE
Urobilinogen, UA: 0.2 (ref 0.0–1.0)
pH: 6 (ref 5.0–8.0)

## 2019-07-08 NOTE — Patient Instructions (Addendum)
Consider multivitamin daily.   Dr. Talbert NanSS:[phone] for appointment

## 2019-07-08 NOTE — Addendum Note (Signed)
Addended by: Agnes Lawrence on: 07/08/2019 09:20 AM   Modules accepted: Orders

## 2019-07-08 NOTE — Addendum Note (Signed)
Addended by: Agnes Lawrence on: 07/08/2019 09:18 AM   Modules accepted: Orders

## 2019-07-09 NOTE — Progress Notes (Signed)
58 y.o. G90P2002 Divorced Black or [place] American Not Hispanic or Latino female here for annual exam.Patient has had her flu vaccine.  Patient had urine culture and micro done with her PCP on 07/08/2019 and it was negative. No vaginal bleeding. Sexually active, no pain.   She has asymptotic prolapse.  She will leak urine when she goes from sitting to standing. No every time. Mainly occurs at work, every day at work. Small to moderate amounts, she wears a pad. No leakage with coughing, sneezing or on the way to the bathroom. 2 x she has lost urine on the way to the bathroom. She drinks a lot of caffeine.    Patient's last menstrual period was 10/08/2013.          Sexually active: Yes.    The current method of family planning is post menopausal status.    Exercising: No.  The patient does not participate in regular exercise at present. Smoker:  no  Health Maintenance: Pap:  04/06/2016 WNL NEG HPV, 09-05-14 WNL NEG HR HPV History of abnormal Pap:  no MMG:  03/26/2018 Birads 1 negative, has appointment tomorrow Colonoscopy:  11-28-14 polyps, needs q 5 years.  BMD:   Never TDaP:  06-26-14 Gardasil: N/A   reports that she has never smoked. She has never used smokeless tobacco. She reports that she does not drink alcohol or use drugs.  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 2 kids and 2 grand kids, all local. The kids are her daughters children, 70 and 49. Son is in his late 5's no kids.   Past Medical History:  Diagnosis Date  . Allergy   . Anxiety    no med  . Depression    no med  . Fibroid   . History of migraine    Hx - last migraine 2 yrs ago, no longer a problem  . 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 Hx of iron deficiency anemia    Hx -per [facility] notes, since childhood, possible thalassemia  . Hyperglycemia   . Hyperlipemia    no med  . Hypertension   . LAD  (lymphadenopathy), axillary    eval with onc and [facility] 2016  . Obesity   . STD (sexually transmitted disease)    gonorrhea about 30 years ago  . SVD (spontaneous vaginal delivery)    x 2    Past Surgical History:  Procedure Laterality Date  . AXILLARY LYMPH NODE BIOPSY Right 06/09/2016   Procedure: AXILLARY LYMPH NODE BIOPSY;  Surgeon: Jackolyn Confer, MD;  Location: [place];  Service: General;  Laterality: Right;  . BREAST EXCISIONAL BIOPSY Left 2006  . BREAST SURGERY Left    due to bloody discharge  . COLONOSCOPY    . DILATATION & CURETTAGE/HYSTEROSCOPY WITH MYOSURE N/A 05/24/2016   Procedure: DILATATION & CURETTAGE/HYSTEROSCOPY WITH MYOSURE;  Surgeon: Salvadore Dom, MD;  Location: [facility];  Service: Gynecology;  Laterality: N/A;  . LAPAROSCOPIC REMOVAL ABDOMINAL MASS  2000   LLQ    Current Outpatient Medications  Medication Sig Dispense Refill  . Calcium Carb-Cholecalciferol (CALCIUM + D3 PO) Take 1 tablet by mouth daily.     . Cholecalciferol (VITAMIN D3) 2000 UNITS TABS Take 2,000 Units by mouth daily.     . hydrochlorothiazide (HYDRODIURIL) 25 MG tablet TAKE 1 TABLET BY MOUTH EVERY DAY 90 tablet 0  . lisinopril (ZESTRIL) 10  MG tablet TAKE 1 TABLET BY MOUTH EVERY DAY 90 tablet 0   No current facility-administered medications for this visit.     Family History  Problem Relation Age of Onset  . Hypertension Mother   . Stroke Mother 27  . Hypertension Father   . Diabetes Father   . Hypertension Sister   . Liver disease Sister        related to infected appendix  . Diabetes Sister   . Hypertension Brother   . Diabetes Brother   . Hypertension Daughter   . Hypertension Sister   . Diabetes Sister   . Breast cancer Neg Hx     Review of Systems  Constitutional: Negative.   HENT: Negative.   Eyes: Negative.   Respiratory: Negative.   Cardiovascular: Negative.   Gastrointestinal: Negative.   Endocrine: Negative.   Genitourinary:       Urinary incontinence   Musculoskeletal: Negative.   Skin: Negative.   Allergic/Immunologic: Negative.   Neurological: Negative.   Hematological: Negative.   Psychiatric/Behavioral: Negative.     Exam:   BP 140/78 (BP Location: Right Arm, Patient Position: Sitting, Cuff Size: Large)   Pulse 62   Temp (!) 97.1 F (36.2 C) (Skin)   Ht 5' 3.39" (1.61 m)   Wt 232 lb 9.6 oz (105.5 kg)   LMP 10/08/2013   BMI 40.70 kg/m   Weight change: @WEIGHTCHANGE @ Height:   Height: 5' 3.39" (161 cm)  Ht Readings from Last 3 Encounters:  07/11/19 5' 3.39" (1.61 m)  07/08/19 5\' 3"  (1.6 m)  06/25/18 5\' 3"  (1.6 m)    General appearance: alert, cooperative and appears stated age Head: Normocephalic, without obvious abnormality, atraumatic Neck: no adenopathy, supple, symmetrical, trachea midline and thyroid normal to inspection and palpation Lungs: clear to auscultation bilaterally Cardiovascular: regular rate and rhythm Breasts: normal appearance, no masses or tenderness Abdomen: soft, non-tender; non distended,  no masses,  no organomegaly Extremities: extremities normal, atraumatic, no cyanosis or edema Skin: Skin color, texture, turgor normal. No rashes or lesions Lymph nodes: Cervical, supraclavicular, and axillary nodes normal. No abnormal inguinal nodes palpated Neurologic: Grossly normal   Pelvic: External genitalia:  no lesions              Urethra:  normal appearing urethra with no masses, tenderness or lesions              Bartholins and Skenes: normal                 Vagina: normal appearing vagina with normal color and discharge, no lesions              Cervix: no lesions   Mild cystocele noted with valsalva.               Bimanual Exam:  Uterus:  retroverted, mobile, slightly enlarged uterus (stable), not tender              Adnexa: no mass, fullness, tenderness               Rectovaginal: Confirms               Anus:  normal sphincter tone, no lesions  Chaperone was present for exam.  A:  Well  Woman with normal exam  Urinary leakage when she goes from sitting to standing, negative urine culture  Minimal prolapse on exam today (more marked in the OR)  P:   No pap this year  Labs with primary  Cut back of caffeine, kegel information given  Call if bladder leakage doesn't improve  Discussed breast self exam  Discussed calcium and vit D intake

## 2019-07-10 LAB — URINE CULTURE
MICRO NUMBER:: 1004190
Result:: NO GROWTH
SPECIMEN QUALITY:: ADEQUATE

## 2019-07-11 ENCOUNTER — Encounter: Payer: Self-pay | Admitting: Obstetrics and Gynecology

## 2019-07-11 ENCOUNTER — Other Ambulatory Visit: Payer: Self-pay

## 2019-07-11 ENCOUNTER — Ambulatory Visit: Payer: BC Managed Care – PPO | Admitting: Obstetrics and Gynecology

## 2019-07-11 VITALS — BP 140/78 | HR 62 | Temp 97.1°F | Ht 63.39 in | Wt 232.6 lb

## 2019-07-11 DIAGNOSIS — R32 Unspecified urinary incontinence: Secondary | ICD-10-CM | POA: Diagnosis not present

## 2019-07-11 DIAGNOSIS — Z01419 Encounter for gynecological examination (general) (routine) without abnormal findings: Secondary | ICD-10-CM

## 2019-07-11 NOTE — Patient Instructions (Signed)
EXERCISE AND DIET:  We recommended that you start or continue a regular exercise program for good health. Regular exercise means any activity that makes your heart beat faster and makes you sweat.  We recommend exercising at least 30 minutes per day at least 3 days a week, preferably 4 or 5.  We also recommend a diet low in fat and sugar.  Inactivity, poor dietary choices and obesity can cause diabetes, heart attack, stroke, and kidney damage, among others.    ALCOHOL AND SMOKING:  Women should limit their alcohol intake to no more than 7 drinks/beers/glasses of wine (combined, not each!) per week. Moderation of alcohol intake to this level decreases your risk of breast cancer and liver damage. And of course, no recreational drugs are part of a healthy lifestyle.  And absolutely no smoking or even second hand smoke. Most people know smoking can cause heart and lung diseases, but did you know it also contributes to weakening of your bones? Aging of your skin?  Yellowing of your teeth and nails?  CALCIUM AND VITAMIN D:  Adequate intake of calcium and Vitamin D are recommended.  The recommendations for exact amounts of these supplements seem to change often, but generally speaking 1,200 mg of calcium (between diet and supplement) and 800 units of Vitamin D per day seems prudent. Certain women may benefit from higher intake of Vitamin D.  If you are among these women, your doctor will have told you during your visit.    PAP SMEARS:  Pap smears, to check for cervical cancer or precancers,  have traditionally been done yearly, although recent scientific advances have shown that most women can have pap smears less often.  However, every woman still should have a physical exam from her gynecologist every year. It will include a breast check, inspection of the vulva and vagina to check for abnormal growths or skin changes, a visual exam of the cervix, and then an exam to evaluate the size and shape of the uterus and  ovaries.  And after 56 years of age, a rectal exam is indicated to check for rectal cancers. We will also provide age appropriate advice regarding health maintenance, like when you should have certain vaccines, screening for sexually transmitted diseases, bone density testing, colonoscopy, mammograms, etc.   MAMMOGRAMS:  All women over 40 years old should have a yearly mammogram. Many facilities now offer a "3D" mammogram, which may cost around $50 extra out of pocket. If possible,  we recommend you accept the option to have the 3D mammogram performed.  It both reduces the number of women who will be called back for extra views which then turn out to be normal, and it is better than the routine mammogram at detecting truly abnormal areas.    COLON CANCER SCREENING: Now recommend starting at age 45. At this time colonoscopy is not covered for routine screening until 50. There are take home tests that can be done between 45-49.   COLONOSCOPY:  Colonoscopy to screen for colon cancer is recommended for all women at age 50.  We know, you hate the idea of the prep.  We agree, BUT, having colon cancer and not knowing it is worse!!  Colon cancer so often starts as a polyp that can be seen and removed at colonscopy, which can quite literally save your life!  And if your first colonoscopy is normal and you have no family history of colon cancer, most women don't have to have it again for   10 years.  Once every ten years, you can do something that may end up saving your life, right?  We will be happy to help you get it scheduled when you are ready.  Be sure to check your insurance coverage so you understand how much it will cost.  It may be covered as a preventative service at no cost, but you should check your particular policy.      Breast Self-Awareness Breast self-awareness means being familiar with how your breasts look and feel. It involves checking your breasts regularly and reporting any changes to your  health care provider. Practicing breast self-awareness is important. A change in your breasts can be a sign of a serious medical problem. Being familiar with how your breasts look and feel allows you to find any problems early, when treatment is more likely to be successful. All women should practice breast self-awareness, including women who have had breast implants. How to do a breast self-exam One way to learn what is normal for your breasts and whether your breasts are changing is to do a breast self-exam. To do a breast self-exam: Look for Changes  1. Remove all the clothing above your waist. 2. Stand in front of a mirror in a room with good lighting. 3. Put your hands on your hips. 4. Push your hands firmly downward. 5. Compare your breasts in the mirror. Look for differences between them (asymmetry), such as: ? Differences in shape. ? Differences in size. ? Puckers, dips, and bumps in one breast and not the other. 6. Look at each breast for changes in your skin, such as: ? Redness. ? Scaly areas. 7. Look for changes in your nipples, such as: ? Discharge. ? Bleeding. ? Dimpling. ? Redness. ? A change in position. Feel for Changes Carefully feel your breasts for lumps and changes. It is best to do this while lying on your back on the floor and again while sitting or standing in the shower or tub with soapy water on your skin. Feel each breast in the following way:  Place the arm on the side of the breast you are examining above your head.  Feel your breast with the other hand.  Start in the nipple area and make  inch (2 cm) overlapping circles to feel your breast. Use the pads of your three middle fingers to do this. Apply light pressure, then medium pressure, then firm pressure. The light pressure will allow you to feel the tissue closest to the skin. The medium pressure will allow you to feel the tissue that is a little deeper. The firm pressure will allow you to feel the tissue  close to the ribs.  Continue the overlapping circles, moving downward over the breast until you feel your ribs below your breast.  Move one finger-width toward the center of the body. Continue to use the  inch (2 cm) overlapping circles to feel your breast as you move slowly up toward your collarbone.  Continue the up and down exam using all three pressures until you reach your armpit.  Write Down What You Find  Write down what is normal for each breast and any changes that you find. Keep a written record with breast changes or normal findings for each breast. By writing this information down, you do not need to depend only on memory for size, tenderness, or location. Write down where you are in your menstrual cycle, if you are still menstruating. If you are having trouble noticing differences   in your breasts, do not get discouraged. With time you will become more familiar with the variations in your breasts and more comfortable with the exam. How often should I examine my breasts? Examine your breasts every month. If you are breastfeeding, the best time to examine your breasts is after a feeding or after using a breast pump. If you menstruate, the best time to examine your breasts is 5-7 days after your period is over. During your period, your breasts are lumpier, and it may be more difficult to notice changes. When should I see my health care provider? See your health care provider if you notice:  A change in shape or size of your breasts or nipples.  A change in the skin of your breast or nipples, such as a reddened or scaly area.  Unusual discharge from your nipples.  A lump or thick area that was not there before.  Pain in your breasts.  Anything that concerns you.   Kegel Exercises  Kegel exercises can help strengthen your pelvic floor muscles. The pelvic floor is a group of muscles that support your rectum, small intestine, and bladder. In females, pelvic floor muscles also  help support the womb (uterus). These muscles help you control the flow of urine and stool. Kegel exercises are painless and simple, and they do not require any equipment. Your provider may suggest Kegel exercises to:  Improve bladder and bowel control.  Improve sexual response.  Improve weak pelvic floor muscles after surgery to remove the uterus (hysterectomy) or pregnancy (females).  Improve weak pelvic floor muscles after prostate gland removal or surgery (males). Kegel exercises involve squeezing your pelvic floor muscles, which are the same muscles you squeeze when you try to stop the flow of urine or keep from passing gas. The exercises can be done while sitting, standing, or lying down, but it is best to vary your position. Exercises How to do Kegel exercises: 1. Squeeze your pelvic floor muscles tight. You should feel a tight lift in your rectal area. If you are a female, you should also feel a tightness in your vaginal area. Keep your stomach, buttocks, and legs relaxed. 2. Hold the muscles tight for up to 10 seconds. 3. Breathe normally. 4. Relax your muscles. 5. Repeat as told by your health care provider. Repeat this exercise daily as told by your health care provider. Continue to do this exercise for at least 4-6 weeks, or for as long as told by your health care provider. You may be referred to a physical therapist who can help you learn more about how to do Kegel exercises. Depending on your condition, your health care provider may recommend:  Varying how long you squeeze your muscles.  Doing several sets of exercises every day.  Doing exercises for several weeks.  Making Kegel exercises a part of your regular exercise routine. This information is not intended to replace advice given to you by your health care provider. Make sure you discuss any questions you have with your health care provider. Document Released: 08/22/2012 Document Revised: 04/25/2018 Document Reviewed:  04/25/2018 Elsevier Patient Education  2020 [employer].

## 2019-07-12 ENCOUNTER — Ambulatory Visit
Admission: RE | Admit: 2019-07-12 | Discharge: 2019-07-12 | Disposition: A | Discharge status: Home / Self Care | Payer: BC Managed Care – PPO | Source: Ambulatory Visit | Attending: Family Medicine | Admitting: Family Medicine

## 2019-07-12 DIAGNOSIS — Z1231 Encounter for screening mammogram for malignant neoplasm of breast: Secondary | ICD-10-CM | POA: Diagnosis not present

## 2019-09-05 ENCOUNTER — Other Ambulatory Visit: Payer: Self-pay | Admitting: Family Medicine

## 2019-09-05 MED ORDER — LISINOPRIL 10 MG PO TABS: 10.0000 mg | ORAL_TABLET | Freq: Every day | ORAL | 0 refills | Status: DC

## 2019-09-05 MED ORDER — HYDROCHLOROTHIAZIDE 25 MG PO TABS: 25.0000 mg | ORAL_TABLET | Freq: Every day | ORAL | 0 refills | Status: DC

## 2019-09-05 NOTE — Telephone Encounter (Signed)
Medication Refill - Medication: lisinopril, hydrochlorothiazide   Has the patient contacted their pharmacy? Yes.   Pt called stating she is completely out of these medications. Pt states pharmacy has tried to contact office. Please advise. (Agent: If no, request that the patient contact the pharmacy for the refill.) (Agent: If yes, when and what did the pharmacy advise?)  Preferred Pharmacy (with phone number or street name):  [id number] [address], [address]  [place] [address]  Phone: [phone] Fax: [phone]  Not a 24 hour pharmacy; exact hours not known.     Agent: Please be advised that RX refills may take up to 3 business days. We ask that you follow-up with your pharmacy.

## 2019-09-26 ENCOUNTER — Telehealth: Payer: Self-pay | Admitting: *Deleted

## 2019-09-26 NOTE — Telephone Encounter (Signed)
Copied from [place] [phone]. Topic: General - Inquiry >> Sep 26, 2019 10:04 AM Virl Axe D wrote: Reason for CRM: Pt would like to know if Dr. Ethlyn Gallery thinks she should get the Covid vaccine. It is being offered by her employer. Stated she has underlying conditions. Please advise

## 2019-09-26 NOTE — Telephone Encounter (Signed)
Yes I would recommend it!  It is great that she is able to get it so early. Just got mine today!

## 2019-09-26 NOTE — Telephone Encounter (Signed)
Patient informed of the message below.

## 2019-09-30 ENCOUNTER — Telehealth: Payer: Self-pay | Admitting: Family Medicine

## 2019-09-30 ENCOUNTER — Other Ambulatory Visit: Payer: Self-pay

## 2019-09-30 NOTE — Telephone Encounter (Signed)
Spoke with the pt and informed her of the message below.  Patient declines any other questions for the form.

## 2019-09-30 NOTE — Telephone Encounter (Signed)
Patient would like call back from [place] or PCP to discuss covid vaccine form patient is filling out. She states she needs help on some of the questions. For instance, patient would like to know if she has had "a weakened immune system in the last 3 months" and needs assistance filling out the questionnaire. Please advise.

## 2019-09-30 NOTE — Telephone Encounter (Signed)
I would not say she has weakened immune system, but she does have conditions that increase risk of severe illness from COVID: Diabetes II (based on last A1C) Obesity  Please see if you can answer questions and if not I am happy to call

## 2019-10-01 ENCOUNTER — Other Ambulatory Visit (INDEPENDENT_AMBULATORY_CARE_PROVIDER_SITE_OTHER): Payer: BC Managed Care – PPO

## 2019-10-01 DIAGNOSIS — E785 Hyperlipidemia, unspecified: Secondary | ICD-10-CM | POA: Diagnosis not present

## 2019-10-01 DIAGNOSIS — D649 Anemia, unspecified: Secondary | ICD-10-CM

## 2019-10-01 DIAGNOSIS — E119 Type 2 diabetes mellitus without complications: Secondary | ICD-10-CM | POA: Diagnosis not present

## 2019-10-01 DIAGNOSIS — I10 Essential (primary) hypertension: Secondary | ICD-10-CM | POA: Diagnosis not present

## 2019-10-01 LAB — MICROALBUMIN / CREATININE URINE RATIO
Creatinine,U: 261.5 mg/dL
Microalb Creat Ratio: 1 mg/g (ref 0.0–30.0)
Microalb, Ur: 2.5 mg/dL — ABNORMAL HIGH (ref 0.0–1.9)

## 2019-10-01 LAB — CBC WITH DIFFERENTIAL/PLATELET
Basophils Absolute: 0 10*3/uL (ref 0.0–0.1)
Basophils Relative: 0.2 % (ref 0.0–3.0)
Eosinophils Absolute: 0.1 10*3/uL (ref 0.0–0.7)
Eosinophils Relative: 0.7 % (ref 0.0–5.0)
HCT: 37.5 % (ref 36.0–46.0)
Hemoglobin: 12.3 g/dL (ref 12.0–15.0)
Lymphocytes Relative: 26.7 % (ref 12.0–46.0)
Lymphs Abs: 2.8 10*3/uL (ref 0.7–4.0)
MCHC: 32.8 g/dL (ref 30.0–36.0)
MCV: 86.4 fl (ref 78.0–100.0)
Monocytes Absolute: 0.7 10*3/uL (ref 0.1–1.0)
Monocytes Relative: 6.5 % (ref 3.0–12.0)
Neutro Abs: 6.9 10*3/uL (ref 1.4–7.7)
Neutrophils Relative %: 65.9 % (ref 43.0–77.0)
Platelets: 416 10*3/uL — ABNORMAL HIGH (ref 150.0–400.0)
RBC: 4.33 Mil/uL (ref 3.87–5.11)
RDW: 15.4 % (ref 11.5–15.5)
WBC: 10.4 10*3/uL (ref 4.0–10.5)

## 2019-10-01 LAB — IBC + FERRITIN
Ferritin: 84.2 ng/mL (ref 10.0–291.0)
Iron: 48 ug/dL (ref 42–145)
Saturation Ratios: 15 % — ABNORMAL LOW (ref 20.0–50.0)
Transferrin: 229 mg/dL (ref 212.0–360.0)

## 2019-10-01 LAB — LIPID PANEL
Cholesterol: 209 mg/dL — ABNORMAL HIGH (ref 0–200)
HDL: 56.7 mg/dL (ref >39.00)
LDL Cholesterol: 137 mg/dL — ABNORMAL HIGH (ref 0–99)
NonHDL: 152.49
Total CHOL/HDL Ratio: 4
Triglycerides: 77 mg/dL (ref 0.0–149.0)
VLDL: 15.4 mg/dL (ref 0.0–40.0)

## 2019-10-01 LAB — COMPREHENSIVE METABOLIC PANEL
ALT: 20 U/L (ref 0–35)
AST: 22 U/L (ref 0–37)
Albumin: 4.1 g/dL (ref 3.5–5.2)
Alkaline Phosphatase: 88 U/L (ref 39–117)
BUN: 13 mg/dL (ref 6–23)
CO2: 31 mEq/L (ref 19–32)
Calcium: 9.6 mg/dL (ref 8.4–10.5)
Chloride: 100 mEq/L (ref 96–112)
Creatinine, Ser: 0.72 mg/dL (ref 0.40–1.20)
GFR: 101.13 mL/min (ref >60.00)
Glucose, Bld: 147 mg/dL — ABNORMAL HIGH (ref 70–99)
Potassium: 3.8 mEq/L (ref 3.5–5.1)
Sodium: 139 mEq/L (ref 135–145)
Total Bilirubin: 0.4 mg/dL (ref 0.2–1.2)
Total Protein: 7.3 g/dL (ref 6.0–8.3)

## 2019-10-01 LAB — VITAMIN B12: Vitamin B-12: 488 pg/mL (ref 211–911)

## 2019-10-01 LAB — HEMOGLOBIN A1C: Hgb A1c MFr Bld: 6.6 % — ABNORMAL HIGH (ref 4.6–6.5)

## 2019-10-11 ENCOUNTER — Ambulatory Visit (INDEPENDENT_AMBULATORY_CARE_PROVIDER_SITE_OTHER): Payer: BC Managed Care – PPO | Admitting: Family Medicine

## 2019-10-11 ENCOUNTER — Encounter: Payer: Self-pay | Admitting: Family Medicine

## 2019-10-11 ENCOUNTER — Other Ambulatory Visit: Payer: Self-pay

## 2019-10-11 VITALS — BP 102/78 | HR 89 | Temp 97.3°F | Ht 63.0 in | Wt 235.0 lb

## 2019-10-11 DIAGNOSIS — R319 Hematuria, unspecified: Secondary | ICD-10-CM | POA: Diagnosis not present

## 2019-10-11 DIAGNOSIS — E785 Hyperlipidemia, unspecified: Secondary | ICD-10-CM

## 2019-10-11 DIAGNOSIS — I10 Essential (primary) hypertension: Secondary | ICD-10-CM

## 2019-10-11 DIAGNOSIS — E119 Type 2 diabetes mellitus without complications: Secondary | ICD-10-CM | POA: Diagnosis not present

## 2019-10-11 LAB — POCT URINALYSIS DIPSTICK
Bilirubin, UA: NEGATIVE
Glucose, UA: NEGATIVE
Ketones, UA: NEGATIVE
Nitrite, UA: NEGATIVE
Protein, UA: NEGATIVE
Spec Grav, UA: 1.025 (ref 1.010–1.025)
Urobilinogen, UA: 0.2 E.U./dL
pH, UA: 6 (ref 5.0–8.0)

## 2019-10-11 MED ORDER — PRAVASTATIN SODIUM 20 MG PO TABS: 20.0000 mg | ORAL_TABLET | Freq: Every day | ORAL | 3 refills | Status: DC

## 2019-10-11 NOTE — Progress Notes (Signed)
[address] Santiago DOB: Jul 04, 1961 Encounter date: 10/11/2019  This is a 59 y.o. female who presents with Chief Complaint  Patient presents with  . Follow-up    History of present illness: Hypertension: Not regularly checking at home.  She is taking her lisinopril daily as directed. Hyperlipidemia: Has not previously been on cholesterol treatment.  We did review blood work today as well as lipid goals especially with new diagnosis of diabetes. Diabetes: A1c stable at 6.6 on most recent blood work.  Working on [employer].  She is not exercising on a regular basis, limited by motivation as well as time (working 2 jobs)  Breathing has been better at night. 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    Allergies  Allergen Reactions  . Hydrocodone Hives  . Oxycodone Hives   Current Meds  Medication Sig  . Calcium Carb-Cholecalciferol (CALCIUM + D3 PO) Take 1 tablet by mouth daily.   . Cholecalciferol (VITAMIN D3) 2000 UNITS TABS Take 2,000 Units by mouth daily.   . hydrochlorothiazide (HYDRODIURIL) 25 MG tablet Take 1 tablet (25 mg total) by mouth daily.  Marland Kitchen lisinopril (ZESTRIL) 10 MG tablet Take 1 tablet (10 mg total) by mouth daily.    Review of Systems  Constitutional: Negative for chills, fatigue and fever.  Respiratory: Negative for cough, chest tightness, shortness of breath and wheezing.   Cardiovascular: Negative for chest pain, palpitations and leg swelling.    Objective:  BP 102/78 (BP Location: Right Arm, Patient Position: Sitting, Cuff Size: Large) Comment (BP Location): ri  Pulse 89   Temp (!) 97.3 F (36.3 C) (Temporal)   Ht 5\' 3"  (1.6 m)   Wt 235 lb (106.6 kg)   LMP 10/08/2013   SpO2 98%   BMI 41.63 kg/m   Weight: 235 lb (106.6 kg)   BP Readings from Last 3 Encounters:  10/11/19 102/78  07/11/19 140/78  07/08/19 122/80   Wt Readings from Last 3 Encounters:  10/11/19 235 lb (106.6 kg)  07/11/19 232 lb 9.6 oz (105.5 kg)  07/08/19 235 lb  (106.6 kg)    Physical Exam Constitutional:      General: She is not in acute distress.    Appearance: She is well-developed.  Cardiovascular:     Rate and Rhythm: Normal rate and regular rhythm.     Heart sounds: Normal heart sounds. No murmur. No friction rub.  Pulmonary:     Effort: Pulmonary effort is normal. No respiratory distress.     Breath sounds: Normal breath sounds. No wheezing or rales.  Musculoskeletal:     Right lower leg: No edema.     Left lower leg: No edema.  Neurological:     Mental Status: She is alert and oriented to person, place, and time.  Psychiatric:        Behavior: Behavior normal.     Assessment/Plan  1. Essential hypertension Well-controlled.  Continue current medication.  2. Hyperlipidemia, unspecified hyperlipidemia type After discussing pros and cons, she agreed to try pravastatin.  We will plan to recheck blood work in 6 months time.  She will let me know sooner if any problems with medication.Discussed new medication(s) today with patient. Discussed potential side effects and patient verbalized understanding.  - pravastatin (PRAVACHOL) 20 MG tablet; Take 1 tablet (20 mg total) by mouth daily.  Dispense: 90 tablet; Refill: 3  3. Controlled type 2 diabetes mellitus without complication, without long-term current use of insulin (HCC) Continue watching carbohydrate intake.  Discussed main  goal of starting exercise program.  Discussed trying something with higher intensity intervals that she can do which does not require a lot of time.  4. Hematuria, unspecified type We will recheck urine today due to hematuria noted on previous urine. No urinary sx. - POC Urinalysis Dipstick    Return in about 6 months (around 04/09/2020) for blood work and then [place] in 6 months.     Micheline Rough, MD

## 2019-10-11 NOTE — Patient Instructions (Signed)
Look into high intensity interval training for work outs.   (beach body - 21 day fix)

## 2019-12-03 ENCOUNTER — Other Ambulatory Visit: Payer: Self-pay | Admitting: Family Medicine

## 2020-02-27 ENCOUNTER — Other Ambulatory Visit: Payer: Self-pay | Admitting: Family Medicine

## 2020-04-06 ENCOUNTER — Other Ambulatory Visit: Payer: Self-pay | Admitting: Family Medicine

## 2020-07-01 ENCOUNTER — Other Ambulatory Visit: Payer: Self-pay | Admitting: Oncology

## 2020-07-01 ENCOUNTER — Other Ambulatory Visit: Payer: Self-pay | Admitting: Family Medicine

## 2020-07-01 DIAGNOSIS — C8103 Nodular lymphocyte predominant Hodgkin lymphoma, intra-abdominal lymph nodes: Secondary | ICD-10-CM

## 2020-07-01 DIAGNOSIS — Z1231 Encounter for screening mammogram for malignant neoplasm of breast: Secondary | ICD-10-CM

## 2020-07-03 ENCOUNTER — Ambulatory Visit: Payer: BC Managed Care – PPO | Admitting: Family Medicine

## 2020-07-03 ENCOUNTER — Other Ambulatory Visit: Payer: Self-pay

## 2020-07-03 ENCOUNTER — Encounter: Payer: Self-pay | Admitting: Family Medicine

## 2020-07-03 VITALS — BP 118/82 | HR 88 | Temp 98.4°F | Ht 63.0 in | Wt 214.7 lb

## 2020-07-03 DIAGNOSIS — E785 Hyperlipidemia, unspecified: Secondary | ICD-10-CM | POA: Diagnosis not present

## 2020-07-03 DIAGNOSIS — R739 Hyperglycemia, unspecified: Secondary | ICD-10-CM | POA: Diagnosis not present

## 2020-07-03 DIAGNOSIS — R319 Hematuria, unspecified: Secondary | ICD-10-CM | POA: Diagnosis not present

## 2020-07-03 DIAGNOSIS — C8103 Nodular lymphocyte predominant Hodgkin lymphoma, intra-abdominal lymph nodes: Secondary | ICD-10-CM

## 2020-07-03 DIAGNOSIS — F419 Anxiety disorder, unspecified: Secondary | ICD-10-CM

## 2020-07-03 DIAGNOSIS — I10 Essential (primary) hypertension: Secondary | ICD-10-CM | POA: Diagnosis not present

## 2020-07-03 MED ORDER — PRAVASTATIN SODIUM 20 MG PO TABS: 20.0000 mg | ORAL_TABLET | Freq: Every day | ORAL | 1 refills | Status: DC

## 2020-07-03 MED ORDER — CLONAZEPAM 0.5 MG PO TABS: 0.5000 mg | ORAL_TABLET | Freq: Every day | ORAL | 0 refills | Status: DC | PRN
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
## 2020-07-03 NOTE — Progress Notes (Signed)
[facility] at this time.

## 2020-07-03 NOTE — Patient Instructions (Signed)
Consider "insight timer" app on phone. There are multiple free guided meditations on this app.   Write down concerns, anxiety and also pros for work. Write note to management regarding your concerns/inappropriate treatment at work.

## 2020-07-04 LAB — CBC WITH DIFFERENTIAL/PLATELET
Absolute Monocytes: 682 cells/uL (ref 200–950)
Basophils Absolute: 48 cells/uL (ref 0–200)
Basophils Relative: 0.5 %
Eosinophils Absolute: 58 cells/uL (ref 15–500)
Eosinophils Relative: 0.6 %
HCT: 39.5 % (ref 35.0–45.0)
Hemoglobin: 12.5 g/dL (ref 11.7–15.5)
Lymphs Abs: 3293 cells/uL (ref 850–3900)
MCH: 27.7 pg (ref 27.0–33.0)
MCHC: 31.6 g/dL — ABNORMAL LOW (ref 32.0–36.0)
MCV: 87.6 fL (ref 80.0–100.0)
MPV: 9.1 fL (ref 7.5–12.5)
Monocytes Relative: 7.1 %
Neutro Abs: 5520 cells/uL (ref 1500–7800)
Neutrophils Relative %: 57.5 %
Platelets: 407 10*3/uL — ABNORMAL HIGH (ref 140–400)
RBC: 4.51 10*6/uL (ref 3.80–5.10)
RDW: 13.6 % (ref 11.0–15.0)
Total Lymphocyte: 34.3 %
WBC: 9.6 10*3/uL (ref 3.8–10.8)

## 2020-07-04 LAB — COMPREHENSIVE METABOLIC PANEL
AG Ratio: 1.2 (calc) (ref 1.0–2.5)
ALT: 22 U/L (ref 6–29)
AST: 22 U/L (ref 10–35)
Albumin: 4.1 g/dL (ref 3.6–5.1)
Alkaline phosphatase (APISO): 83 U/L (ref 37–153)
BUN: 14 mg/dL (ref 7–25)
CO2: 29 mmol/L (ref 20–32)
Calcium: 10 mg/dL (ref 8.6–10.4)
Chloride: 103 mmol/L (ref 98–110)
Creat: 0.7 mg/dL (ref 0.50–1.05)
Globulin: 3.3 g/dL (calc) (ref 1.9–3.7)
Glucose, Bld: 129 mg/dL — ABNORMAL HIGH (ref 65–99)
Potassium: 3.9 mmol/L (ref 3.5–5.3)
Sodium: 139 mmol/L (ref 135–146)
Total Bilirubin: 0.6 mg/dL (ref 0.2–1.2)
Total Protein: 7.4 g/dL (ref 6.1–8.1)

## 2020-07-04 LAB — URINALYSIS
Bilirubin Urine: NEGATIVE
Glucose, UA: NEGATIVE
Hgb urine dipstick: NEGATIVE
Ketones, ur: NEGATIVE
Leukocytes,Ua: NEGATIVE
Nitrite: NEGATIVE
Protein, ur: NEGATIVE
Specific Gravity, Urine: 1.029 (ref 1.001–1.03)
pH: <=5 (ref 5.0–8.0)

## 2020-07-04 LAB — LIPID PANEL
Cholesterol: 197 mg/dL (ref <200)
HDL: 63 mg/dL (ref >=50)
LDL Cholesterol (Calc): 118 mg/dL (calc) — ABNORMAL HIGH
Non-HDL Cholesterol (Calc): 134 mg/dL (calc) — ABNORMAL HIGH (ref <130)
Total CHOL/HDL Ratio: 3.1 (calc) (ref <5.0)
Triglycerides: 64 mg/dL (ref <150)

## 2020-07-04 LAB — HEMOGLOBIN A1C
Hgb A1c MFr Bld: 6.5 % of total Hgb — ABNORMAL HIGH (ref <5.7)
Mean Plasma Glucose: 140 (calc)
eAG (mmol/L): 7.7 (calc)

## 2020-07-07 MED ORDER — PRAVASTATIN SODIUM 40 MG PO TABS: 40.0000 mg | ORAL_TABLET | Freq: Every day | ORAL | 1 refills | Status: DC

## 2020-07-07 NOTE — Addendum Note (Signed)
Addended by: Agnes Lawrence on: 07/07/2020 09:52 AM   Modules accepted: Orders

## 2020-07-07 NOTE — Progress Notes (Signed)
Noted.  Great!

## 2020-07-13 NOTE — Progress Notes (Signed)
59 y.o. [id number] Divorced Black or African American Not Hispanic or Latino female here for annual exam.  Patient states that she has a 5 week h/o intermittent left groin/pelvic pain. She states that it started about 5 weeks ago and has gotten worse. She wonders if it is a lymph node pushing on a nerve. The pain is worse with straining and when she is active, hurts to sit on that side. She has a H/O Hodgkins disease and has an appointment to see her Oncologist.    She has urge incontinence, leaks 3 x a week, can be small or large amounts. Morning is the worse, particularly when she gets out of bed. She drinks 16 oz of coffee a day.   She has had occasional spotting, last occurred about a month ago. Happened about 5 months.   Patient's last menstrual period was 10/08/2013.          Sexually active: No.  The current method of family planning is post menopausal status.    Exercising: No.  The patient has a physically strenuous job, but has no regular exercise apart from work.  Smoker:  no  Health Maintenance: Pap: 04/06/2016 WNL NEG HPV, 09-05-14 WNL NEG HR HPV History of abnormal Pap:  no MMG:  07/12/19 density B Bi-rads 2 benign  BMD:   None  Colonoscopy: 11/28/14 f/u 10 years  TDaP:  06/26/14 Gardasil: NA   reports that she has never smoked. She has never used smokeless tobacco. She reports that she does not drink alcohol and does not use drugs. 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 2 kids and 2 grand kids, all local. The kids are her daughters children, 13 and 51. Son is in his late 36's no kids.   Past Medical History:  Diagnosis Date  . Allergy   . Anxiety    no med  . Depression    no med  . Fibroid   . History of migraine    Hx - last migraine 2 yrs ago, no longer a problem  . Hodgkin disease ([place]) 1999   lymphocytic predominant Hodkin's 2000, s/p 4 weeks Rituxan, chlorambucil 10/21/99-04/19/00  . Hx of iron deficiency anemia    Hx -per [facility]  notes, since childhood, possible thalassemia  . Hyperglycemia   . Hyperlipemia    no med  . Hypertension   . LAD (lymphadenopathy), axillary    eval with onc and [facility] 2016  . Obesity   . STD (sexually transmitted disease)    gonorrhea about 30 years ago  . SVD (spontaneous vaginal delivery)    x 2    Past Surgical History:  Procedure Laterality Date  . AXILLARY LYMPH NODE BIOPSY Right 06/09/2016   Procedure: AXILLARY LYMPH NODE BIOPSY;  Surgeon: Jackolyn Confer, MD;  Location: [facility];  Service: General;  Laterality: Right;  . BREAST EXCISIONAL BIOPSY Left 2006  . BREAST SURGERY Left    due to bloody discharge  . COLONOSCOPY    . DILATATION & CURETTAGE/HYSTEROSCOPY WITH MYOSURE N/A 05/24/2016   Procedure: DILATATION & CURETTAGE/HYSTEROSCOPY WITH MYOSURE;  Surgeon: Salvadore Dom, MD;  Location: [facility];  Service: Gynecology;  Laterality: N/A;  . LAPAROSCOPIC REMOVAL ABDOMINAL MASS  2000   LLQ    Current Outpatient Medications  Medication Sig Dispense Refill  . Calcium Carb-Cholecalciferol (CALCIUM + D3 PO) Take 1 tablet by mouth daily.     . Cholecalciferol (VITAMIN D3) 2000 UNITS TABS Take 2,000  Units by mouth daily.     . clonazePAM (KLONOPIN) 0.5 MG tablet Take 1 tablet (0.5 mg total) by mouth daily as needed for anxiety. 20 tablet 0  . hydrochlorothiazide (HYDRODIURIL) 25 MG tablet TAKE 1 TABLET BY MOUTH EVERY DAY 90 tablet 0  . lisinopril (ZESTRIL) 10 MG tablet TAKE 1 TABLET BY MOUTH EVERY DAY 90 tablet 0  . pravastatin (PRAVACHOL) 20 MG tablet Take 1 tablet (20 mg total) by mouth daily. 90 tablet 3  . pravastatin (PRAVACHOL) 40 MG tablet Take 1 tablet (40 mg total) by mouth daily. 90 tablet 1   No current facility-administered medications for this visit.    Family History  Problem Relation Age of Onset  . Hypertension Mother   . Stroke Mother 62  . Hypertension Father   . Diabetes Father   . Hypertension Sister   . Liver disease Sister        related to infected  appendix  . Diabetes Sister   . Hypertension Brother   . Diabetes Brother   . Hypertension Daughter   . Hypertension Sister   . Diabetes Sister   . Breast cancer Neg Hx     Review of Systems  Gastrointestinal: Positive for abdominal pain and constipation.  Psychiatric/Behavioral: The patient is nervous/anxious.   All other systems reviewed and are negative.   Exam:   LMP 10/08/2013   Weight change: @WEIGHTCHANGE @ Height:      Ht Readings from Last 3 Encounters:  07/03/20 5\' 3"  (1.6 m)  10/11/19 5\' 3"  (1.6 m)  07/11/19 5' 3.39" (1.61 m)    General appearance: alert, cooperative and appears stated age Head: Normocephalic, without obvious abnormality, atraumatic Neck: no adenopathy, supple, symmetrical, trachea midline and thyroid normal to inspection and palpation Lungs: clear to auscultation bilaterally Cardiovascular: regular rate and rhythm Breasts: normal appearance, no masses or tenderness Abdomen: soft, non-tender; non distended,  no masses,  no organomegaly Extremities: extremities normal, atraumatic, no cyanosis or edema Skin: Skin color, texture, turgor normal. No rashes or lesions Lymph nodes: Cervical, supraclavicular, and axillary nodes normal. No abnormal inguinal nodes palpated Neurologic: Grossly normal   Pelvic: External genitalia:  no lesions              Urethra:  normal appearing urethra with no masses, tenderness or lesions              Bartholins and Skenes: normal                 Vagina: normal appearing vagina with normal color and discharge, no lesions              Cervix: no cervical motion tenderness and no lesions               Bimanual Exam:  Uterus:  retroverted, enlarged uterus,not tender, decreased mobility.               Adnexa: no mass, fullness, tenderness               Rectovaginal: Confirms               Anus:  normal sphincter tone, no lesions She points to a spot lateral to her left vulva as the area of pain, states it's deep inside.  No lumps, not tender to palpation in that area.   Gae Dry, chaperoned for the exam.  A:  Well Woman with normal exam  Left groin/pelvic pain, etiology is not clear to me  PMP  spotting  Urge incontinence, not worsening  Constipation  Prediabetes, recent labs with primary. HgbA1C was 6.5%  P:   Pap with hpv  Return for pelvic ultrasound  Colonoscopy UTD  Mammogram scheduled  Information on bladder training given  Discussed breast self exam  Discussed calcium and vit D intake  Refer to prediabetes clinic.      In addition to her annual exam, over 20 minutes was spent in counseling and management of her PMP bleeding, urge incontinence, constipation and pelvic pain

## 2020-07-16 ENCOUNTER — Encounter: Payer: Self-pay | Admitting: Obstetrics and Gynecology

## 2020-07-16 ENCOUNTER — Telehealth: Payer: Self-pay

## 2020-07-16 ENCOUNTER — Other Ambulatory Visit: Payer: Self-pay

## 2020-07-16 ENCOUNTER — Ambulatory Visit: Payer: BC Managed Care – PPO | Admitting: Obstetrics and Gynecology

## 2020-07-16 ENCOUNTER — Other Ambulatory Visit: Payer: Self-pay | Admitting: Oncology

## 2020-07-16 ENCOUNTER — Other Ambulatory Visit (HOSPITAL_COMMUNITY)
Admission: RE | Admit: 2020-07-16 | Discharge: 2020-07-16 | Disposition: A | Discharge status: Home / Self Care | Payer: BC Managed Care – PPO | Source: Ambulatory Visit | Attending: Obstetrics and Gynecology | Admitting: Obstetrics and Gynecology

## 2020-07-16 VITALS — BP 122/64 | HR 82 | Ht 63.5 in | Wt 214.0 lb

## 2020-07-16 DIAGNOSIS — K5900 Constipation, unspecified: Secondary | ICD-10-CM

## 2020-07-16 DIAGNOSIS — Z124 Encounter for screening for malignant neoplasm of cervix: Secondary | ICD-10-CM | POA: Diagnosis not present

## 2020-07-16 DIAGNOSIS — C8103 Nodular lymphocyte predominant Hodgkin lymphoma, intra-abdominal lymph nodes: Secondary | ICD-10-CM

## 2020-07-16 DIAGNOSIS — Z01419 Encounter for gynecological examination (general) (routine) without abnormal findings: Secondary | ICD-10-CM | POA: Diagnosis not present

## 2020-07-16 DIAGNOSIS — N3941 Urge incontinence: Secondary | ICD-10-CM

## 2020-07-16 DIAGNOSIS — N950 Postmenopausal bleeding: Secondary | ICD-10-CM

## 2020-07-16 DIAGNOSIS — R1032 Left lower quadrant pain: Secondary | ICD-10-CM | POA: Diagnosis not present

## 2020-07-16 DIAGNOSIS — R7303 Prediabetes: Secondary | ICD-10-CM

## 2020-07-16 NOTE — Telephone Encounter (Signed)
Called patient per Dr Alen Blew. Advised patient he ordered a CT and and once it has been approved we can schedule it. Patient verbalized understanding.

## 2020-07-16 NOTE — Patient Instructions (Addendum)
You can try Magnesium 500 mg a day for constipation.   About Constipation  Constipation Overview Constipation is the most common gastrointestinal complaint -- about 4 million Americans experience constipation and make 2.5 million physician visits a year to get help for the problem.  Constipation can occur when the colon absorbs too much water, the colon's muscle contraction is slow or sluggish, and/or there is delayed transit time through the colon.  The result is stool that is hard and dry.  Indicators of constipation include straining during bowel movements greater than 25% of the time, having fewer than three bowel movements per week, and/or the feeling of incomplete evacuation.  There are established guidelines ([place] II ) for defining constipation. A person needs to have two or more of the following symptoms for at least 12 weeks (not necessarily consecutive) in the preceding 12 months: . Straining in  greater than 25% of bowel movements . Lumpy or hard stools in greater than 25% of bowel movements . Sensation of incomplete emptying in greater than 25% of bowel movements . Sensation of anorectal obstruction/blockade in greater than 25% of bowel movements . Manual maneuvers to help empty greater than 25% of bowel movements (e.g., digital evacuation, support of the pelvic floor)  . Less than  3 bowel movements/week . Loose stools are not present, and criteria for irritable bowel syndrome are insufficient  Common Causes of Constipation . Lack of fiber in your diet . Lack of physical activity . Medications, including iron and calcium supplements  . Dairy intake . Dehydration . Abuse of laxatives  Travel  Irritable Bowel Syndrome  Pregnancy  Luteal phase of menstruation (after ovulation and before menses)  Colorectal problems  Intestinal Dysfunction  Treating Constipation  There are several ways of treating constipation, including changes to diet and exercise, use of laxatives,  adjustments to the pelvic floor, and scheduled toileting.  These treatments include: . increasing fiber and fluids in the diet  . increasing physical activity . learning muscle coordination   learning proper toileting techniques and toileting modifications   designing and sticking  to a toileting schedule     2007, [employer] Doc.22  Urinary Incontinence  Urinary incontinence refers to a condition in which a person is unable to control where and when to pass urine. A person with this condition will urinate when he or she does not mean to (involuntarily). What are the causes? This condition may be caused by:  Medicines.  Infections.  Constipation.  Overactive bladder muscles.  Weak bladder muscles.  Weak pelvic floor muscles. These muscles provide support for the bladder, intestine, and, in women, the uterus.  Enlarged prostate in men. The prostate is a gland near the bladder. When it gets too big, it can pinch the urethra. With the urethra blocked, the bladder can weaken and lose the ability to empty properly.  Surgery.  Emotional factors, such as anxiety, stress, or post-traumatic stress disorder (PTSD).  Pelvic organ prolapse. This happens in women when organs shift out of place and into the vagina. This shift can prevent the bladder and urethra from working properly. What increases the risk? The following factors may make you more likely to develop this condition:  Older age.  Obesity and physical inactivity.  Pregnancy and childbirth.  Menopause.  Diseases that affect the nerves or spinal cord (neurological diseases).  Long-term (chronic) coughing. This can increase pressure on the bladder and pelvic floor muscles. What are the signs or symptoms? Symptoms may vary depending  on the type of urinary incontinence you have. They include:  A sudden urge to urinate, but passing urine involuntarily before you can get to a bathroom (urge  incontinence).  Suddenly passing urine with any activity that forces urine to pass, such as coughing, laughing, exercise, or sneezing (stress incontinence).  Needing to urinate often, but urinating only a small amount, or constantly dribbling urine (overflow incontinence).  Urinating because you cannot get to the bathroom in time due to a physical disability, such as arthritis or injury, or communication and thinking problems, such as Alzheimer disease (functional incontinence). How is this diagnosed? This condition may be diagnosed based on:  Your medical history.  A physical exam.  Tests, such as: ? Urine tests. ? X-rays of your kidney and bladder. ? Ultrasound. ? CT scan. ? Cystoscopy. In this procedure, a health care provider inserts a tube with a light and camera (cystoscope) through the urethra and into the bladder in order to check for problems. ? Urodynamic testing. These tests assess how well the bladder, urethra, and sphincter can store and release urine. There are different types of urodynamic tests, and they vary depending on what the test is measuring. To help diagnose your condition, your health care provider may recommend that you keep a log of when you urinate and how much you urinate. How is this treated? Treatment for this condition depends on the type of incontinence that you have and its cause. Treatment may include:  Lifestyle changes, such as: ? Quitting smoking. ? Maintaining a healthy weight. ? Staying active. Try to get 150 minutes of moderate-intensity exercise every week. Ask your health care provider which activities are safe for you. ? Eating a healthy diet.  Avoid high-fat foods, like fried foods.  Avoid refined carbohydrates like white bread and white rice.  Limit how much alcohol and caffeine you drink.  Increase your fiber intake. Foods such as fresh fruits, vegetables, beans, and whole grains are healthy sources of fiber.  Pelvic floor muscle  exercises.  Bladder training, such as lengthening the amount of time between bathroom breaks, or using the bathroom at regular intervals.  Using techniques to suppress bladder urges. This can include distraction techniques or controlled breathing exercises.  Medicines to relax the bladder muscles and prevent bladder spasms.  Medicines to help slow or prevent the growth of a man's prostate.  Botox injections. These can help relax the bladder muscles.  Using pulses of electricity to help change bladder reflexes (electrical nerve stimulation).  For women, using a medical device to prevent urine leaks. This is a small, tampon-like, disposable device that is inserted into the urethra.  Injecting collagen or carbon beads (bulking agents) into the urinary sphincter. These can help thicken tissue and close the bladder opening.  Surgery. Follow these instructions at home: Lifestyle  Limit alcohol and caffeine. These can fill your bladder quickly and irritate it.  Keep yourself clean to help prevent odors and skin damage. Ask your doctor about special skin creams and cleansers that can protect the skin from urine.  Consider wearing pads or adult diapers. Make sure to change them regularly, and always change them right after experiencing incontinence. General instructions  Take over-the-counter and prescription medicines only as told by your health care provider.  Use the bathroom about every 3-4 hours, even if you do not feel the need to urinate. Try to empty your bladder completely every time. After urinating, wait a minute. Then try to urinate again.  Make sure  you are in a relaxed position while urinating.  If your incontinence is caused by nerve problems, keep a log of the medicines you take and the times you go to the bathroom.  Keep all follow-up visits as told by your health care provider. This is important. Contact a health care provider if:  You have pain that gets  worse.  Your incontinence gets worse. Get help right away if:  You have a fever or chills.  You are unable to urinate.  You have redness in your groin area or down your legs. Summary  Urinary incontinence refers to a condition in which a person is unable to control where and when to pass urine.  This condition may be caused by medicines, infection, weak bladder muscles, weak pelvic floor muscles, enlargement of the prostate (in men), or surgery.  The following factors increase your risk for developing this condition: older age, obesity, pregnancy and childbirth, menopause, neurological diseases, and chronic coughing.  There are several types of urinary incontinence. They include urge incontinence, stress incontinence, overflow incontinence, and functional incontinence.  This condition is usually treated first with lifestyle and behavioral changes, such as quitting smoking, eating a healthier diet, and doing regular pelvic floor exercises. Other treatment options include medicines, bulking agents, medical devices, electrical nerve stimulation, or surgery. This information is not intended to replace advice given to you by your health care provider. Make sure you discuss any questions you have with your health care provider. Document Revised: 09/15/2017 Document Reviewed: 12/15/2016 Elsevier Patient Education  [place]. Kegel Exercises  Kegel exercises can help strengthen your pelvic floor muscles. The pelvic floor is a group of muscles that support your rectum, small intestine, and bladder. In females, pelvic floor muscles also help support the womb (uterus). These muscles help you control the flow of urine and stool. Kegel exercises are painless and simple, and they do not require any equipment. Your provider may suggest Kegel exercises to:  Improve bladder and bowel control.  Improve sexual response.  Improve weak pelvic floor muscles after surgery to remove the uterus  (hysterectomy) or pregnancy (females).  Improve weak pelvic floor muscles after prostate gland removal or surgery (males). Kegel exercises involve squeezing your pelvic floor muscles, which are the same muscles you squeeze when you try to stop the flow of urine or keep from passing gas. The exercises can be done while sitting, standing, or lying down, but it is best to vary your position. Exercises How to do Kegel exercises: 1. Squeeze your pelvic floor muscles tight. You should feel a tight lift in your rectal area. If you are a female, you should also feel a tightness in your vaginal area. Keep your stomach, buttocks, and legs relaxed. 2. Hold the muscles tight for up to 10 seconds. 3. Breathe normally. 4. Relax your muscles. 5. Repeat as told by your health care provider. Repeat this exercise daily as told by your health care provider. Continue to do this exercise for at least 4-6 weeks, or for as long as told by your health care provider. You may be referred to a physical therapist who can help you learn more about how to do Kegel exercises. Depending on your condition, your health care provider may recommend:  Varying how long you squeeze your muscles.  Doing several sets of exercises every day.  Doing exercises for several weeks.  Making Kegel exercises a part of your regular exercise routine. This information is not intended to replace advice given  to you by your health care provider. Make sure you discuss any questions you have with your health care provider. Document Revised: 04/25/2018 Document Reviewed: 04/25/2018 Elsevier Patient Education  [place] DIET:  We recommended that you start or continue a regular exercise program for good health. Regular exercise means any activity that makes your heart beat faster and makes you sweat.  We recommend exercising at least 30 minutes per day at least 3 days a week, preferably 4 or 5.  We also recommend a diet low  in fat and sugar.  Inactivity, poor dietary choices and obesity can cause diabetes, heart attack, stroke, and kidney damage, among others.    ALCOHOL AND SMOKING:  Women should limit their alcohol intake to no more than 7 drinks/beers/glasses of wine (combined, not each!) per week. Moderation of alcohol intake to this level decreases your risk of breast cancer and liver damage. And of course, no recreational drugs are part of a healthy lifestyle.  And absolutely no smoking or even second hand smoke. Most people know smoking can cause heart and lung diseases, but did you know it also contributes to weakening of your bones? Aging of your skin?  Yellowing of your teeth and nails?  CALCIUM AND VITAMIN D:  Adequate intake of calcium and Vitamin D are recommended.  The recommendations for exact amounts of these supplements seem to change often, but generally speaking 1,200 mg of calcium (between diet and supplement) and 800 units of Vitamin D per day seems prudent. Certain women may benefit from higher intake of Vitamin D.  If you are among these women, your doctor will have told you during your visit.    PAP SMEARS:  Pap smears, to check for cervical cancer or precancers,  have traditionally been done yearly, although recent scientific advances have shown that most women can have pap smears less often.  However, every woman still should have a physical exam from her gynecologist every year. It will include a breast check, inspection of the vulva and vagina to check for abnormal growths or skin changes, a visual exam of the cervix, and then an exam to evaluate the size and shape of the uterus and ovaries.  And after 57 years of age, a rectal exam is indicated to check for rectal cancers. We will also provide age appropriate advice regarding health maintenance, like when you should have certain vaccines, screening for sexually transmitted diseases, bone density testing, colonoscopy, mammograms, etc.   MAMMOGRAMS:   All women over 51 years old should have a yearly mammogram. Many facilities now offer a "3D" mammogram, which may cost around $50 extra out of pocket. If possible,  we recommend you accept the option to have the 3D mammogram performed.  It both reduces the number of women who will be called back for extra views which then turn out to be normal, and it is better than the routine mammogram at detecting truly abnormal areas.    COLON CANCER SCREENING: Now recommend starting at age 71. At this time colonoscopy is not covered for routine screening until 50. There are take home tests that can be done between 45-49.   COLONOSCOPY:  Colonoscopy to screen for colon cancer is recommended for all women at age 55.  We know, you hate the idea of the prep.  We agree, BUT, having colon cancer and not knowing it is worse!!  Colon cancer so often starts as a polyp that can be seen and removed  at colonscopy, which can quite literally save your life!  And if your first colonoscopy is normal and you have no family history of colon cancer, most women don't have to have it again for 10 years.  Once every ten years, you can do something that may end up saving your life, right?  We will be happy to help you get it scheduled when you are ready.  Be sure to check your insurance coverage so you understand how much it will cost.  It may be covered as a preventative service at no cost, but you should check your particular policy.      Breast Self-Awareness Breast self-awareness means being familiar with how your breasts look and feel. It involves checking your breasts regularly and reporting any changes to your health care provider. Practicing breast self-awareness is important. A change in your breasts can be a sign of a serious medical problem. Being familiar with how your breasts look and feel allows you to find any problems early, when treatment is more likely to be successful. All women should practice breast self-awareness,  including women who have had breast implants. How to do a breast self-exam One way to learn what is normal for your breasts and whether your breasts are changing is to do a breast self-exam. To do a breast self-exam: Look for Changes  1. Remove all the clothing above your waist. 2. Stand in front of a mirror in a room with good lighting. 3. Put your hands on your hips. 4. Push your hands firmly downward. 5. Compare your breasts in the mirror. Look for differences between them (asymmetry), such as: ? Differences in shape. ? Differences in size. ? Puckers, dips, and bumps in one breast and not the other. 6. Look at each breast for changes in your skin, such as: ? Redness. ? Scaly areas. 7. Look for changes in your nipples, such as: ? Discharge. ? Bleeding. ? Dimpling. ? Redness. ? A change in position. Feel for Changes Carefully feel your breasts for lumps and changes. It is best to do this while lying on your back on the floor and again while sitting or standing in the shower or tub with soapy water on your skin. Feel each breast in the following way:  Place the arm on the side of the breast you are examining above your head.  Feel your breast with the other hand.  Start in the nipple area and make  inch (2 cm) overlapping circles to feel your breast. Use the pads of your three middle fingers to do this. Apply light pressure, then medium pressure, then firm pressure. The light pressure will allow you to feel the tissue closest to the skin. The medium pressure will allow you to feel the tissue that is a little deeper. The firm pressure will allow you to feel the tissue close to the ribs.  Continue the overlapping circles, moving downward over the breast until you feel your ribs below your breast.  Move one finger-width toward the center of the body. Continue to use the  inch (2 cm) overlapping circles to feel your breast as you move slowly up toward your collarbone.  Continue the  up and down exam using all three pressures until you reach your armpit.  Write Down What You Find  Write down what is normal for each breast and any changes that you find. Keep a written record with breast changes or normal findings for each breast. By writing this information down,  you do not need to depend only on memory for size, tenderness, or location. Write down where you are in your menstrual cycle, if you are still menstruating. If you are having trouble noticing differences in your breasts, do not get discouraged. With time you will become more familiar with the variations in your breasts and more comfortable with the exam. How often should I examine my breasts? Examine your breasts every month. If you are breastfeeding, the best time to examine your breasts is after a feeding or after using a breast pump. If you menstruate, the best time to examine your breasts is 5-7 days after your period is over. During your period, your breasts are lumpier, and it may be more difficult to notice changes. When should I see my health care provider? See your health care provider if you notice:  A change in shape or size of your breasts or nipples.  A change in the skin of your breast or nipples, such as a reddened or scaly area.  Unusual discharge from your nipples.  A lump or thick area that was not there before.  Pain in your breasts.  Anything that concerns you.

## 2020-07-16 NOTE — Telephone Encounter (Signed)
Referral placed to the prediabetes clinic.  Cc: Hayley for referral  Encounter closed

## 2020-07-16 NOTE — Telephone Encounter (Signed)
-----   Message from Wyatt Portela, MD sent at 07/16/2020  2:19 PM EDT ----- Will arrange for a scan before her next MD visit. Thanks ----- Message ----- From: Kennedy Bucker, LPN Sent: [id number]   2:15 PM EDT To: Wyatt Portela, MD  Patient called questioning if she should have a CT scan. Patient states that she has a pain on the left side of her groin. Patient stated she went and seen her gynecologist and they did not find anything that could be causing her pain. Please advise.   Kim LPN

## 2020-07-16 NOTE — Telephone Encounter (Signed)
-----   Message from Salvadore Dom, MD sent at 07/16/2020  9:24 AM EDT ----- Please set her up for a consult for the prediabetes clinic.  Thanks, jill

## 2020-07-20 LAB — CYTOLOGY - PAP
Comment: NEGATIVE
Diagnosis: NEGATIVE
High risk HPV: NEGATIVE

## 2020-07-21 ENCOUNTER — Other Ambulatory Visit: Payer: Self-pay

## 2020-07-21 ENCOUNTER — Telehealth: Payer: Self-pay

## 2020-07-21 DIAGNOSIS — N950 Postmenopausal bleeding: Secondary | ICD-10-CM

## 2020-07-21 NOTE — Telephone Encounter (Signed)
Spoke with patient regarding benefits for recommended ultrasound. Patient is aware that ultrasound is transvaginal. Patient acknowledges understanding of information presented. Patient is aware of cancellation policy. Patient scheduled appointment for 08/06/2020 at 0800AM with Sumner Boast, MD.   Patient stated that she is scheduled for a CT abdomen pelvis with contrast on 07/28/2020. Patient wanting to confirm that PUS is still necessary, even if she has the CT.  Routing to Dr. Talbert Nan to clarify.

## 2020-07-21 NOTE — Telephone Encounter (Signed)
The ultrasound is better at evaluating the lining of the uterus than a CT, but please ask Radiology to send me a copy of her CT. Also ask her to call back towards the end of next week, I will review the CT and confirm if she still needs the ultrasound

## 2020-07-21 NOTE — Telephone Encounter (Signed)
CT scheduled for 07/28/20 at 830am. Patient is aware.

## 2020-07-21 NOTE — Progress Notes (Signed)
PUS orders placed per Dr Talbert Nan. Encounter closed Cc: Hayley for precert

## 2020-07-21 NOTE — Telephone Encounter (Signed)
-----   Message from Aundria Rud sent at 07/20/2020  2:52 PM EDT ----- Regarding: RE: PA Referral authorized ----- Message ----- From: Kennedy Bucker, LPN Sent: 90/94/0005   2:32 PM EDT To: [place] [address] Subject: PA                                             Good afternoon, Once this patient has PA for her CT could you notify, [place], so we can schedule her before her appointment with Dr Alen Blew on 11/12. Thank you.   Kim LPN

## 2020-07-23 NOTE — Telephone Encounter (Signed)
Spoke with patient. Advised per Dr. Gentry Fitz message to have CT copy sent to our office and Dr. Talbert Nan will review and confirm a PUS is still necessary. Patient very appreciative.  Encounter closed.

## 2020-07-24 ENCOUNTER — Other Ambulatory Visit: Payer: Self-pay

## 2020-07-24 ENCOUNTER — Ambulatory Visit
Admission: RE | Admit: 2020-07-24 | Discharge: 2020-07-24 | Disposition: A | Discharge status: Home / Self Care | Payer: BC Managed Care – PPO | Source: Ambulatory Visit | Attending: Family Medicine | Admitting: Family Medicine

## 2020-07-24 DIAGNOSIS — Z1231 Encounter for screening mammogram for malignant neoplasm of breast: Secondary | ICD-10-CM | POA: Diagnosis not present

## 2020-07-28 ENCOUNTER — Encounter (HOSPITAL_COMMUNITY): Payer: Self-pay

## 2020-07-28 ENCOUNTER — Ambulatory Visit (HOSPITAL_COMMUNITY)
Admission: RE | Admit: 2020-07-28 | Discharge: 2020-07-28 | Disposition: A | Discharge status: Home / Self Care | Payer: BC Managed Care – PPO | Source: Ambulatory Visit | Attending: Oncology | Admitting: Oncology

## 2020-07-28 ENCOUNTER — Other Ambulatory Visit: Payer: Self-pay

## 2020-07-28 DIAGNOSIS — K5900 Constipation, unspecified: Secondary | ICD-10-CM | POA: Diagnosis not present

## 2020-07-28 DIAGNOSIS — C8103 Nodular lymphocyte predominant Hodgkin lymphoma, intra-abdominal lymph nodes: Secondary | ICD-10-CM | POA: Diagnosis not present

## 2020-07-28 DIAGNOSIS — D259 Leiomyoma of uterus, unspecified: Secondary | ICD-10-CM | POA: Diagnosis not present

## 2020-07-28 DIAGNOSIS — R1032 Left lower quadrant pain: Secondary | ICD-10-CM | POA: Diagnosis not present

## 2020-07-28 DIAGNOSIS — K7689 Other specified diseases of liver: Secondary | ICD-10-CM | POA: Diagnosis not present

## 2020-07-28 MED ORDER — IOHEXOL 300 MG/ML  SOLN
100.0000 mL | Freq: Once | INTRAMUSCULAR | Status: AC | PRN
  Administered 2020-07-28: 100 mL via INTRAVENOUS

## 2020-07-31 ENCOUNTER — Other Ambulatory Visit: Payer: Self-pay

## 2020-07-31 ENCOUNTER — Inpatient Hospital Stay: Payer: BC Managed Care – PPO

## 2020-07-31 ENCOUNTER — Inpatient Hospital Stay: Payer: BC Managed Care – PPO | Attending: Oncology | Admitting: Oncology

## 2020-07-31 VITALS — BP 147/69 | HR 70 | Temp 97.4°F | Resp 18 | Ht 63.5 in | Wt 215.3 lb

## 2020-07-31 DIAGNOSIS — C8103 Nodular lymphocyte predominant Hodgkin lymphoma, intra-abdominal lymph nodes: Secondary | ICD-10-CM

## 2020-07-31 DIAGNOSIS — D259 Leiomyoma of uterus, unspecified: Secondary | ICD-10-CM | POA: Insufficient documentation

## 2020-07-31 DIAGNOSIS — Z8571 Personal history of Hodgkin lymphoma: Secondary | ICD-10-CM | POA: Insufficient documentation

## 2020-07-31 DIAGNOSIS — R109 Unspecified abdominal pain: Secondary | ICD-10-CM | POA: Insufficient documentation

## 2020-07-31 DIAGNOSIS — Z9221 Personal history of antineoplastic chemotherapy: Secondary | ICD-10-CM | POA: Insufficient documentation

## 2020-07-31 LAB — CMP (CANCER CENTER ONLY)
ALT: 23 U/L (ref 0–44)
AST: 25 U/L (ref 15–41)
Albumin: 3.9 g/dL (ref 3.5–5.0)
Alkaline Phosphatase: 82 U/L (ref 38–126)
Anion gap: 7 (ref 5–15)
BUN: 9 mg/dL (ref 6–20)
CO2: 31 mmol/L (ref 22–32)
Calcium: 10.1 mg/dL (ref 8.9–10.3)
Chloride: 102 mmol/L (ref 98–111)
Creatinine: 0.84 mg/dL (ref 0.44–1.00)
GFR, Estimated: >60 mL/min (ref >60)
Glucose, Bld: 134 mg/dL — ABNORMAL HIGH (ref 70–99)
Potassium: 3.9 mmol/L (ref 3.5–5.1)
Sodium: 140 mmol/L (ref 135–145)
Total Bilirubin: 0.6 mg/dL (ref 0.3–1.2)
Total Protein: 8 g/dL (ref 6.5–8.1)

## 2020-07-31 LAB — CBC WITH DIFFERENTIAL (CANCER CENTER ONLY)
Abs Immature Granulocytes: 0.02 10*3/uL (ref 0.00–0.07)
Basophils Absolute: 0 10*3/uL (ref 0.0–0.1)
Basophils Relative: 0 %
Eosinophils Absolute: 0.1 10*3/uL (ref 0.0–0.5)
Eosinophils Relative: 1 %
HCT: 37.6 % (ref 36.0–46.0)
Hemoglobin: 12.3 g/dL (ref 12.0–15.0)
Immature Granulocytes: 0 %
Lymphocytes Relative: 38 %
Lymphs Abs: 3.5 10*3/uL (ref 0.7–4.0)
MCH: 28.5 pg (ref 26.0–34.0)
MCHC: 32.7 g/dL (ref 30.0–36.0)
MCV: 87 fL (ref 80.0–100.0)
Monocytes Absolute: 0.6 10*3/uL (ref 0.1–1.0)
Monocytes Relative: 6 %
Neutro Abs: 5.1 10*3/uL (ref 1.7–7.7)
Neutrophils Relative %: 55 %
Platelet Count: 379 10*3/uL (ref 150–400)
RBC: 4.32 MIL/uL (ref 3.87–5.11)
RDW: 14 % (ref 11.5–15.5)
WBC Count: 9.4 10*3/uL (ref 4.0–10.5)
nRBC: 0 % (ref 0.0–0.2)

## 2020-07-31 NOTE — Progress Notes (Signed)
Hematology and Oncology Follow Up Visit  Whitney Santiago [id number] 1960/10/23 59 y.o. 07/31/2020 11:56 AM Koberlein, Steele Berg, MDKoberlein, Steele Berg, MD   Principle Diagnosis: 59 year old woman  with Hodgkin's disease diagnosed in 2001.  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   Prior Therapy: She was treated at [facility] with rituximab concomitantly with chlorambucil orally. She appeared to have a good response and remained disease free between 2001 and 2015.  She developed recurrent disease with axillary lymph node involvement with indolent course in 2017 and has not required treatment.  Current therapy: Active surveillance.  Interim History: Whitney Santiago returns today for a follow-up evaluation.  Since the last visit, she reports no major changes in her health.  She has reported lower abdominal and groin discomfort predominantly on the left side.  She has not reported any fevers, chills or sweats.  She denies any appetite changes.  She denies any hematochezia or melena.  She denies any vaginal bleeding.  Her performance status quality of life remains unchanged.  Medications: Unchanged on review. Current Outpatient Medications  Medication Sig Dispense Refill  . Calcium Carb-Cholecalciferol (CALCIUM + D3 PO) Take 1 tablet by mouth daily.     . Calcium Carbonate-Vitamin D 600-200 MG-UNIT TABS Take 600 mg by mouth daily.    . Cholecalciferol (VITAMIN D3) 2000 UNITS TABS Take 2,000 Units by mouth daily.     . clonazePAM (KLONOPIN) 0.5 MG tablet Take 1 tablet (0.5 mg total) by mouth daily as needed for anxiety. 20 tablet 0  . hydrochlorothiazide (HYDRODIURIL) 25 MG tablet TAKE 1 TABLET BY MOUTH EVERY DAY 90 tablet 0  . lisinopril (ZESTRIL) 10 MG tablet TAKE 1 TABLET BY MOUTH EVERY DAY 90 tablet 0  . pravastatin (PRAVACHOL) 40 MG tablet Take 1 tablet (40 mg total) by mouth daily. 90 tablet 1   No  current facility-administered medications for this visit.     Allergies:  Allergies  Allergen Reactions  . Hydrocodone Hives  . Oxycodone Hives       Physical Exam:  Blood pressure (!) 147/69, pulse 70, temperature (!) 97.4 F (36.3 C), temperature source Tympanic, resp. rate 18, height 5' 3.5" (1.613 m), weight 215 lb 4.8 oz (97.7 kg), last menstrual period 10/08/2013, SpO2 100 %.    ECOG: 0   General appearance: Comfortable appearing without any discomfort Head: Normocephalic without any trauma Oropharynx: Mucous membranes are moist and pink without any thrush or ulcers. Eyes: Pupils are equal and round reactive to light. Lymph nodes: No cervical, supraclavicular, inguinal or axillary lymphadenopathy.   Heart:regular rate and rhythm.  S1 and S2 without leg edema. Lung: Clear without any rhonchi or wheezes.  No dullness to percussion. Abdomin: Soft, nontender, nondistended with good bowel sounds.  No hepatosplenomegaly. Musculoskeletal: No joint deformity or effusion.  Full range of motion noted. Neurological: No deficits noted on motor, sensory and deep tendon reflex exam. Skin: No petechial rash or dryness.  Appeared moist.    Lab Results: Lab Results  Component Value Date   WBC 9.6 07/03/2020   HGB 12.5 07/03/2020   HCT 39.5 07/03/2020   MCV 87.6 07/03/2020   PLT 407 (H) 07/03/2020     Chemistry      Component Value Date/Time   NA 139 07/03/2020 1122   NA 140 06/23/2016 0855   K 3.9 07/03/2020 1122   K 3.8 06/23/2016 0855   CL 103 07/03/2020 1122  CO2 29 07/03/2020 1122   CO2 28 06/23/2016 0855   BUN 14 07/03/2020 1122   BUN 12.2 06/23/2016 0855   CREATININE 0.70 07/03/2020 1122   CREATININE 0.9 06/23/2016 0855      Component Value Date/Time   CALCIUM 10.0 07/03/2020 1122   CALCIUM 10.2 06/23/2016 0855   ALKPHOS 88 10/01/2019 0853   ALKPHOS 91 06/23/2016 0855   AST 22 07/03/2020 1122   AST 17 06/23/2016 0855   ALT 22 07/03/2020 1122   ALT 15  06/23/2016 0855   BILITOT 0.6 07/03/2020 1122   BILITOT <0.2 02/27/2017 1358   BILITOT 0.38 06/23/2016 0855       IMPRESSION: 1. No acute process or evidence of active lymphoma within the abdomen or pelvis. 2. Progression of uterine fibroids compared to 2002. 3. Right middle lobe pulmonary nodule of 4 mm, favored to be new since the PET of 11/21/2014. Most likely benign/post infectious/inflammatory. Follow-up chest CT at 6 months could be considered. 4. Subtle irregular hepatic capsule, chronic. Correlate with risk factors for mild cirrhosis. 5. Aortic Atherosclerosis (ICD10-I70.0).   Impression and Plan:   59 year old woman with:  1.  Relapsed Hodgkin's disease after initial diagnosis in year 2000.  She has biopsy-proven recurrent disease in 2017 and has not required treatment for that relapse.  The natural course of her disease was reviewed and imaging studies obtained on July 28, 2020 were discussed.  She has no evidence of systemic disease of the abdomen and pelvis to explain her abdominal discomfort.  She does have a uterine fibroids which could be contributing to her discomfort.  Options of therapy were reiterated which include radiation therapy to any problematic enlarged lymph node versus systemic therapy versus continued observation.  2.  Anemia: No hemoglobin is back to normal range.  Labs from July 03, 2020 with a hemoglobin of 12.5.  3.  Lower abdominal discomfort: Unclear etiology at this time without any evidence of malignancy on her CT scan.  She does have an enlarging uterine fibroid that could be contributing to her pain.  4. Follow-up: In 6 months for repeat follow-up.  30  minutes were dedicated to this encounter.  Time was spent on reviewing imaging studies, reviewing pathology results and treatment options for the future.   [name], MD 11/12/202111:56 AM

## 2020-08-03 ENCOUNTER — Telehealth: Payer: Self-pay

## 2020-08-03 NOTE — Telephone Encounter (Signed)
I have reviewed her CT. She still needs to have the ultrasound. We can compare the ultrasound to her last ultrasound in 2007 and we need to evaluate her endometrial stripe which the CT didn't do.

## 2020-08-03 NOTE — Telephone Encounter (Signed)
Patient's CT scan is ready for Dr Whitney Santiago to review and want to know if she should keep appointment for Thursday.

## 2020-08-03 NOTE — Telephone Encounter (Signed)
H/o Non Hodgkins Lymphoma dx 2001  Pt states calling to give update to Dr Talbert Nan about recent CT scan and to see if needs to keep PUS appt on 08/06/20. Per reviewed CT scan results, pt has enlarging uterine fibroid that could be contributing to pelvic/left side pain.   Pt states was having PUS here due to pelvic pain x 3 weeks and PMB. Pt was seen with Dr Talbert Nan on 07/16/20 for AEX and saw Dr Alen Blew on 11/12 for CT follow up. Will follow up with Dr Alen Blew in 6 months.   Pt advised will give update and review with Dr Talbert Nan and return call with plan of care. Pt agreeable.   Routing to Dr Talbert Nan. Please advise. Does pt need to keep PUS on 11/18?

## 2020-08-04 NOTE — Telephone Encounter (Signed)
Call to patient. Message given to patient as seen below from Dr. Talbert Nan and patient verbalized understanding. Patient will keep PUS as scheduled for 08-06-20.   Encounter closed.

## 2020-08-05 ENCOUNTER — Other Ambulatory Visit: Payer: Self-pay | Admitting: Family Medicine

## 2020-08-06 ENCOUNTER — Other Ambulatory Visit: Payer: BC Managed Care – PPO

## 2020-08-06 ENCOUNTER — Other Ambulatory Visit: Payer: BC Managed Care – PPO | Admitting: Obstetrics and Gynecology

## 2020-08-24 ENCOUNTER — Other Ambulatory Visit: Payer: Self-pay

## 2020-08-24 ENCOUNTER — Encounter: Payer: BC Managed Care – PPO | Attending: Obstetrics and Gynecology | Admitting: Registered"

## 2020-08-24 ENCOUNTER — Encounter: Payer: Self-pay | Admitting: Registered"

## 2020-08-24 DIAGNOSIS — R7303 Prediabetes: Secondary | ICD-10-CM | POA: Insufficient documentation

## 2020-08-24 NOTE — Patient Instructions (Addendum)
Consider doing some more self-care to help reduce stress, meditation may be a useful option. Consider eating balance meals and snacks Consider looking through the book provided to learn about food that have carbohydrates. Do not need to focus on calorie counting or the meal plans.

## 2020-08-24 NOTE — Progress Notes (Signed)
Medical Nutrition Therapy   Primary concerns today: pre-diabetes  Referral diagnosis: pre-diabetes Preferred learning style: no preference indicated Learning readiness: ready  NUTRITION ASSESSMENT   Anthropometrics  Not assessed   Clinical Medical Hx: reviewed Medications: reviewed Labs: A1c 6.5% 07/03/20 Notable Signs/Symptoms: tingle in toes  Lifestyle & Dietary Hx  Patient states she has had pre-diabetes for awhile.   Pt states she eats light breakfast and a few snacks during the day, main meal in evening before bed.   Pt states she can't eat anything artificial, veins in arms and neck don't feel right.   Pt states her stress has increased this year and has lost 20 lbs within 6 months. Pt states her MD prescribed a short term course of medication that helped.  Estimated daily fluid intake: 32 oz Supplements: calcium & vitamin d Sleep: 6 hours; 10:30-11:00 pm, doesn't feel like she's getting deep sleep. Up at 6:30 am.  Stress / self-care: 8/10 has moved to another location for 2 yrs. Music on commute. Current average weekly physical activity: ADLs  24-Hr Dietary Recall First Meal: not hungry. Coffee (weekends: bojangles egg, grits biscuit) Snack: whole grain peanut butter Nabs OR potato chips Second Meal: peanuts Snack: crackers Third Meal: 9:30 after work (daughter cooks) salmon OR pasta OR rice with sausage & shrimp OR spaghetti Snack: crackers Beverages: water, 2 cups coffee, cream & sugar  Estimated Energy Needs Calories: 1700  NUTRITION DIAGNOSIS  Food and nutrition knowledge deficit related to foods that raise blood sugar as evidenced by patient guess protein is the macronutrient that raises blood sugar   NUTRITION INTERVENTION  Nutrition education (E-1) on the following topics:  . Pre-diabetes/diabetes diagnosis criteria . Insulin and glucose in blood sugar management . Role of stress in diabetes . Role of physical acitivity . What foods contain  carbohydrates  Handouts Provided Include   Carbohydates and meal planning (novonordisk)  Learning Style & Readiness for Change Teaching method utilized: Visual & Auditory  Demonstrated degree of understanding via: Teach Back  Barriers to learning/adherence to lifestyle change: none  Goals Established by Pt Consider doing some more self-care to help reduce stress, meditation may be a useful option. Consider eating balance meals and snacks Consider looking through the book provided to learn about food that have carbohydrates. Do not need to focus on calorie counting or the meal plans.   MONITORING & EVALUATION Dietary intake, weekly physical activity, and A1c in 4 weeks.  Next Steps  Patient is to return for follow-up and review balanced eating and self care.

## 2020-08-27 ENCOUNTER — Ambulatory Visit (INDEPENDENT_AMBULATORY_CARE_PROVIDER_SITE_OTHER): Payer: BC Managed Care – PPO | Admitting: Obstetrics and Gynecology

## 2020-08-27 ENCOUNTER — Encounter: Payer: Self-pay | Admitting: Obstetrics and Gynecology

## 2020-08-27 ENCOUNTER — Other Ambulatory Visit: Payer: Self-pay

## 2020-08-27 ENCOUNTER — Other Ambulatory Visit: Payer: Self-pay | Admitting: Obstetrics and Gynecology

## 2020-08-27 ENCOUNTER — Ambulatory Visit (INDEPENDENT_AMBULATORY_CARE_PROVIDER_SITE_OTHER): Payer: BC Managed Care – PPO

## 2020-08-27 ENCOUNTER — Other Ambulatory Visit (HOSPITAL_COMMUNITY)
Admission: RE | Admit: 2020-08-27 | Discharge: 2020-08-27 | Disposition: A | Discharge status: Home / Self Care | Payer: BC Managed Care – PPO | Source: Ambulatory Visit | Attending: Obstetrics and Gynecology | Admitting: Obstetrics and Gynecology

## 2020-08-27 VITALS — BP 130/82 | HR 66 | Wt 215.0 lb

## 2020-08-27 DIAGNOSIS — N882 Stricture and stenosis of cervix uteri: Secondary | ICD-10-CM

## 2020-08-27 DIAGNOSIS — M6289 Other specified disorders of muscle: Secondary | ICD-10-CM | POA: Diagnosis not present

## 2020-08-27 DIAGNOSIS — N950 Postmenopausal bleeding: Secondary | ICD-10-CM

## 2020-08-27 DIAGNOSIS — R9389 Abnormal findings on diagnostic imaging of other specified body structures: Secondary | ICD-10-CM

## 2020-08-27 NOTE — Progress Notes (Signed)
GYNECOLOGY  VISIT   HPI: 59 y.o.   Divorced Black or [place] American Not Hispanic or Latino  female   [phone] with Patient's last menstrual period was 10/08/2013.   here for pelvic ultrasound for pelvic pain and PMP spotting  GYNECOLOGIC HISTORY: Patient's last menstrual period was 10/08/2013. Contraception: PMP Menopausal hormone therapy: None        OB History    Gravida  2   Para  2   Term  2   Preterm      AB      Living  2     SAB      IAB      Ectopic      Multiple      Live Births  2              Patient Active Problem List   Diagnosis Date Noted  . Pre-diabetes 08/24/2020  . Hyperglycemia 06/19/2015  . Essential hypertension 01/09/2015  . Hyperlipemia 01/09/2015  . Left shoulder pain - managed by [facility] 06/26/2014  . Hodgkin disease, Hx of 2000, treated at [facility] 06/26/2014    Past Medical History:  Diagnosis Date  . Allergy   . Anxiety    no med  . Depression    no med  . Fibroid   . History of migraine    Hx - last migraine 2 yrs ago, no longer a problem  . Hodgkin disease ([place]) 1999   lymphocytic predominant Hodkin's 2000, s/p 4 weeks Rituxan, chlorambucil 10/21/99-04/19/00  . Hx of iron deficiency anemia    Hx -per [facility] notes, since childhood, possible thalassemia  . Hyperglycemia   . Hyperlipemia    no med  . Hypertension   . LAD (lymphadenopathy), axillary    eval with onc and [facility] 2016  . Obesity   . STD (sexually transmitted disease)    gonorrhea about 30 years ago  . SVD (spontaneous vaginal delivery)    x 2    Past Surgical History:  Procedure Laterality Date  . AXILLARY LYMPH NODE BIOPSY Right 06/09/2016   Procedure: AXILLARY LYMPH NODE BIOPSY;  Surgeon: Jackolyn Confer, MD;  Location: [facility];  Service: General;  Laterality: Right;  . BREAST EXCISIONAL BIOPSY Left 2006  . BREAST SURGERY Left    due to bloody discharge  . COLONOSCOPY    . DILATATION & CURETTAGE/HYSTEROSCOPY WITH MYOSURE N/A 05/24/2016   Procedure:  DILATATION & CURETTAGE/HYSTEROSCOPY WITH MYOSURE;  Surgeon: Salvadore Dom, MD;  Location: [facility];  Service: Gynecology;  Laterality: N/A;  . LAPAROSCOPIC REMOVAL ABDOMINAL MASS  2000   LLQ    Current Outpatient Medications  Medication Sig Dispense Refill  . Calcium Carb-Cholecalciferol (CALCIUM + D3 PO) Take 1 tablet by mouth daily.     . Calcium Carbonate-Vitamin D 600-200 MG-UNIT TABS Take 600 mg by mouth daily.    . Cholecalciferol (VITAMIN D3) 2000 UNITS TABS Take 2,000 Units by mouth daily.     . clonazePAM (KLONOPIN) 0.5 MG tablet Take 1 tablet (0.5 mg total) by mouth daily as needed for anxiety. 20 tablet 0  . hydrochlorothiazide (HYDRODIURIL) 25 MG tablet TAKE 1 TABLET BY MOUTH EVERY DAY 90 tablet 1  . lisinopril (ZESTRIL) 10 MG tablet TAKE 1 TABLET BY MOUTH EVERY DAY 90 tablet 1  . pravastatin (PRAVACHOL) 40 MG tablet Take 1 tablet (40 mg total) by mouth daily. 90 tablet 1   No current facility-administered medications for this visit.     ALLERGIES:  Hydrocodone and Oxycodone  Family History  Problem Relation Age of Onset  . Hypertension Mother   . Stroke Mother 38  . Hypertension Father   . Diabetes Father   . Hypertension Sister   . Liver disease Sister        related to infected appendix  . Diabetes Sister   . Hypertension Brother   . Diabetes Brother   . Hypertension Daughter   . Hypertension Sister   . Diabetes Sister   . Breast cancer Neg Hx     Social History   Socioeconomic History  . Marital status: Divorced    Spouse name: Not on file  . Number of children: Not on file  . Years of education: Not on file  . Highest education level: Not on file  Occupational History  . Not on file  Tobacco Use  . Smoking status: Never Smoker  . Smokeless tobacco: Never Used  Vaping Use  . Vaping Use: Never used  Substance and Sexual Activity  . Alcohol use: No    Alcohol/week: 0.0 standard drinks  . Drug use: No  . Sexual activity: Yes    Partners: Male     Birth control/protection: Post-menopausal  Other Topics Concern  . 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 Worried About Charity fundraiser in the Last Year: Never true  . Ran Out of Food in the Last Year: Never true  Transportation Needs: Not on file  Physical Activity: Not on file  Stress: Not on file  Social Connections: Not on file  Intimate Partner Violence: Not on file    Review of Systems  All other systems reviewed and are negative.   PHYSICAL EXAMINATION:    BP 130/82 (Cuff Size: Large)   Pulse 66   Wt 215 lb (97.5 kg)   LMP 10/08/2013   BMI 37.49 kg/m     General appearance: alert, cooperative and appears stated age   Pelvic: External genitalia:  no lesions              Urethra:  normal appearing urethra with no masses, tenderness or lesions              Bartholins and Skenes: normal                 Vagina: normal appearing vagina with normal color and discharge, no lesions              Cervix: no lesions and stenotic              Bimanual Exam:  Uterus:  enlarged,not tender, decreased mobility              Adnexa: no mass, fullness, tenderness              Pelvic floor: tight and tender on the left only  The risks of endometrial biopsy were reviewed and a consent was obtained.  A speculum was placed in the vagina and the cervix was cleansed with betadine. A tenaculum was placed on the cervix and the  cervix needed to be dilated with the mini-dilators. Once the cervix was adequately dilated, the pipelle was placed into the endometrial cavity. The uterus sounded to 10 cm. The endometrial biopsy was performed, taking care to get a  representative sample, sampling 360 degrees of the uterine cavity. Moderate tissue was obtained. The tenaculum and speculum were removed. There were no complications.                 Chaperone was present for exam.  ASSESSMENT PMP bleeding Stenotic cervix Fibroid uterus Left pelvic floor is tight and tender    PLAN Endometrial biopsy done Further plans depending on results Referral to pelvic floor PT   In addition to reviewing the ultrasound an exam was done and a management plan was made for PT and an endometrial biopsy.

## 2020-08-27 NOTE — Patient Instructions (Signed)

## 2020-08-28 ENCOUNTER — Encounter: Payer: Self-pay | Admitting: Obstetrics and Gynecology

## 2020-08-31 LAB — SURGICAL PATHOLOGY

## 2020-09-01 ENCOUNTER — Telehealth: Payer: Self-pay

## 2020-09-01 DIAGNOSIS — R9389 Abnormal findings on diagnostic imaging of other specified body structures: Secondary | ICD-10-CM

## 2020-09-01 DIAGNOSIS — N950 Postmenopausal bleeding: Secondary | ICD-10-CM

## 2020-09-01 NOTE — Telephone Encounter (Signed)
Left message for pt to return call to triage RN. 

## 2020-09-01 NOTE — Telephone Encounter (Signed)
-----   Message from Salvadore Dom, MD sent at 09/01/2020  4:41 PM EST ----- Please let the patient know that her endometrial biopsy is benign with inactive endometrium. This doesn't explain her thickened endometrial stripe. I would recommend that she return for a sonohysterogram. She has a h/o cervical stenosis. I would recommend pre-treating her with misoprostol 200 mcg, place 2 tablets vaginally 6-12 hours prior to the sonohysterogram. Please warn her that is can make her crampy.  If she doesn't want to do the sonohysterogram, the other option is hysteroscopy in the OR

## 2020-09-03 NOTE — Telephone Encounter (Deleted)
-----   Message from Salvadore Dom, MD sent at 09/01/2020  4:41 PM EST ----- Please let the patient know that her endometrial biopsy is benign with inactive endometrium. This doesn't explain her thickened endometrial stripe. I would recommend that she return for a sonohysterogram. She has a h/o cervical stenosis. I would recommend pre-treating her with misoprostol 200 mcg, place 2 tablets vaginally 6-12 hours prior to the sonohysterogram. Please warn her that is can make her crampy.  If she doesn't want to do the sonohysterogram, the other option is hysteroscopy in the OR

## 2020-09-03 NOTE — Telephone Encounter (Signed)
Spoke with pt. Pt given results and recommendations per Dr Talbert Nan. Pt states would like to precert both before making decision on which to do either [facility] or Hysteroscopy.  Advised will send to [facility] for precert  [facility] orders placed   Cc: Hayley for precert

## 2020-09-07 NOTE — Telephone Encounter (Signed)
Left message to return call to Kindra Bickham at [phone] 

## 2020-09-08 NOTE — Telephone Encounter (Signed)
Patient is returning call.  °

## 2020-09-09 NOTE — Telephone Encounter (Signed)
Patient returned call

## 2020-09-09 NOTE — Telephone Encounter (Signed)
Spoke with patient regarding benefits for [facility] and Hysteroscopy. Informed patient that benefits will change after the first of the year and will precert benefits at that time and return call. Patient appreciative of explanation and agreeable. Encounter closed.

## 2020-10-15 ENCOUNTER — Encounter: Payer: Self-pay | Admitting: Family Medicine

## 2020-10-22 ENCOUNTER — Other Ambulatory Visit: Payer: Self-pay

## 2020-10-22 ENCOUNTER — Emergency Department (HOSPITAL_BASED_OUTPATIENT_CLINIC_OR_DEPARTMENT_OTHER)
Admission: EM | Admit: 2020-10-22 | Discharge: 2020-10-22 | Disposition: A | Discharge status: Home / Self Care | Payer: BC Managed Care – PPO | Attending: Emergency Medicine | Admitting: Emergency Medicine

## 2020-10-22 ENCOUNTER — Encounter (HOSPITAL_BASED_OUTPATIENT_CLINIC_OR_DEPARTMENT_OTHER): Payer: Self-pay | Admitting: *Deleted

## 2020-10-22 DIAGNOSIS — Z79899 Other long term (current) drug therapy: Secondary | ICD-10-CM | POA: Insufficient documentation

## 2020-10-22 DIAGNOSIS — Y92410 Unspecified street and highway as the place of occurrence of the external cause: Secondary | ICD-10-CM | POA: Diagnosis not present

## 2020-10-22 DIAGNOSIS — S40012A Contusion of left shoulder, initial encounter: Secondary | ICD-10-CM | POA: Diagnosis not present

## 2020-10-22 DIAGNOSIS — I10 Essential (primary) hypertension: Secondary | ICD-10-CM | POA: Insufficient documentation

## 2020-10-22 DIAGNOSIS — S4992XA Unspecified injury of left shoulder and upper arm, initial encounter: Secondary | ICD-10-CM | POA: Diagnosis not present

## 2020-10-22 MED ORDER — CYCLOBENZAPRINE HCL 10 MG PO TABS: 10.0000 mg | ORAL_TABLET | Freq: Two times a day (BID) | ORAL | 0 refills | Status: DC | PRN
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
## 2020-11-03 ENCOUNTER — Telehealth: Payer: Self-pay | Admitting: Family Medicine

## 2020-11-03 NOTE — Telephone Encounter (Signed)
[facility] pt to offer ED  Follow up appointment w/Dr. Schmitz--pt declined.  -glh

## 2021-01-01 ENCOUNTER — Encounter: Payer: BC Managed Care – PPO | Admitting: Family Medicine

## 2021-01-15 ENCOUNTER — Encounter: Payer: Self-pay | Admitting: Family Medicine

## 2021-01-15 ENCOUNTER — Other Ambulatory Visit: Payer: Self-pay

## 2021-01-15 ENCOUNTER — Ambulatory Visit (INDEPENDENT_AMBULATORY_CARE_PROVIDER_SITE_OTHER): Payer: BC Managed Care – PPO | Admitting: Family Medicine

## 2021-01-15 VITALS — BP 130/80 | HR 71 | Temp 97.9°F | Ht 63.75 in | Wt 206.5 lb

## 2021-01-15 DIAGNOSIS — Z0000 Encounter for general adult medical examination without abnormal findings: Secondary | ICD-10-CM | POA: Diagnosis not present

## 2021-01-15 DIAGNOSIS — Z23 Encounter for immunization: Secondary | ICD-10-CM

## 2021-01-15 DIAGNOSIS — R739 Hyperglycemia, unspecified: Secondary | ICD-10-CM | POA: Diagnosis not present

## 2021-01-15 DIAGNOSIS — E785 Hyperlipidemia, unspecified: Secondary | ICD-10-CM

## 2021-01-15 DIAGNOSIS — I10 Essential (primary) hypertension: Secondary | ICD-10-CM | POA: Diagnosis not present

## 2021-01-15 DIAGNOSIS — M7061 Trochanteric bursitis, right hip: Secondary | ICD-10-CM

## 2021-01-15 DIAGNOSIS — C8103 Nodular lymphocyte predominant Hodgkin lymphoma, intra-abdominal lymph nodes: Secondary | ICD-10-CM

## 2021-01-15 LAB — CBC WITH DIFFERENTIAL/PLATELET
Basophils Absolute: 0 10*3/uL (ref 0.0–0.1)
Basophils Relative: 0.3 % (ref 0.0–3.0)
Eosinophils Absolute: 0 10*3/uL (ref 0.0–0.7)
Eosinophils Relative: 0.6 % (ref 0.0–5.0)
HCT: 37.4 % (ref 36.0–46.0)
Hemoglobin: 12.3 g/dL (ref 12.0–15.0)
Lymphocytes Relative: 31.9 % (ref 12.0–46.0)
Lymphs Abs: 2.8 10*3/uL (ref 0.7–4.0)
MCHC: 32.8 g/dL (ref 30.0–36.0)
MCV: 87.2 fl (ref 78.0–100.0)
Monocytes Absolute: 0.4 10*3/uL (ref 0.1–1.0)
Monocytes Relative: 5.2 % (ref 3.0–12.0)
Neutro Abs: 5.4 10*3/uL (ref 1.4–7.7)
Neutrophils Relative %: 62 % (ref 43.0–77.0)
Platelets: 418 10*3/uL — ABNORMAL HIGH (ref 150.0–400.0)
RBC: 4.29 Mil/uL (ref 3.87–5.11)
RDW: 14.7 % (ref 11.5–15.5)
WBC: 8.7 10*3/uL (ref 4.0–10.5)

## 2021-01-15 LAB — COMPREHENSIVE METABOLIC PANEL
ALT: 17 U/L (ref 0–35)
AST: 20 U/L (ref 0–37)
Albumin: 4.3 g/dL (ref 3.5–5.2)
Alkaline Phosphatase: 72 U/L (ref 39–117)
BUN: 15 mg/dL (ref 6–23)
CO2: 31 mEq/L (ref 19–32)
Calcium: 9.9 mg/dL (ref 8.4–10.5)
Chloride: 102 mEq/L (ref 96–112)
Creatinine, Ser: 0.7 mg/dL (ref 0.40–1.20)
GFR: 95.62 mL/min (ref >60.00)
Glucose, Bld: 137 mg/dL — ABNORMAL HIGH (ref 70–99)
Potassium: 3.7 mEq/L (ref 3.5–5.1)
Sodium: 140 mEq/L (ref 135–145)
Total Bilirubin: 0.5 mg/dL (ref 0.2–1.2)
Total Protein: 7.2 g/dL (ref 6.0–8.3)

## 2021-01-15 LAB — MICROALBUMIN / CREATININE URINE RATIO
Creatinine,U: 95.2 mg/dL
Microalb Creat Ratio: 1 mg/g (ref 0.0–30.0)
Microalb, Ur: 0.9 mg/dL (ref 0.0–1.9)

## 2021-01-15 LAB — LIPID PANEL
Cholesterol: 155 mg/dL (ref 0–200)
HDL: 50.7 mg/dL (ref >39.00)
LDL Cholesterol: 92 mg/dL (ref 0–99)
NonHDL: 104.03
Total CHOL/HDL Ratio: 3
Triglycerides: 59 mg/dL (ref 0.0–149.0)
VLDL: 11.8 mg/dL (ref 0.0–40.0)

## 2021-01-15 LAB — HEMOGLOBIN A1C: Hgb A1c MFr Bld: 6.5 % (ref 4.6–6.5)

## 2021-01-15 NOTE — Patient Instructions (Addendum)
o For eczema - continue with vasoline to moisturizing; you can use benadryl if itching. Let me know if this isn't enough! We will try to avoid steroids since they can bleach/thin the skin. Moisturize right after bathing.   Hip Bursitis  Hip bursitis is the inflammation of one or more bursae in the hip joint. Bursae are small fluid-filled sacs that absorb shock and prevent bones from rubbing against each other. Hip bursitis can cause mild to moderate pain, and symptoms often come and go over time. What are the causes? This condition results from increased friction between the hip bones and the tendons around the hip joint. This condition can happen if you:  Overuse your hip muscles.  Injure your hip.  Have weak buttocks muscles.  Have bone spurs.  Have an infection. In some cases, the cause may not be known. What increases the risk? You are more likely to develop this condition if:  You injured your hip previously or had hip surgery.  You have a medical condition, such as arthritis, gout, diabetes, or thyroid disease.  You have spine problems.  You have one leg that is shorter than the other.  You participate in athletic activities that include repetitive motion, like running.  You participate in sports where there is a risk of injury or falling, such as football, martial arts, or skiing. What are the signs or symptoms? Symptoms may come and go, and they often include:  Pain in the hip or groin area. Pain may get worse with movement.  Tenderness and swelling of the hip. In rare cases, the bursa may become infected. If this happens, you may get a fever, as well as warmth and redness in the hip area. How is this diagnosed? This condition may be diagnosed based on:  Your symptoms.  Your medical history.  A physical exam.  Imaging tests, such as: ? X-rays to check your bones. ? MRI or ultrasound to check your tendons and muscles. ? Bone scan.  A biopsy to remove  fluid from your inflamed bursa for testing. How is this treated? This condition is treated by resting, icing, applying pressure (compression), and raising (elevating) the injured area. This is called RICE treatment. In some cases, RICE treatment may not be enough to make your symptoms go away. Treatment may also include:  Taking medicine to help with swelling and pain.  Using crutches, a cane, or a walker to decrease the strain on your hip.  Getting a shot of cortisone medicine to help reduce swelling.  Taking other medicines if the bursa is infected.  Draining fluid out of the bursa to help relieve swelling.  Having surgery to remove a damaged or infected bursa. This is rare. Long-term treatment may include:  Physical therapy exercises for strength and flexibility.  Lifestyle changes, such as weight loss, to reduce the strain on the hip. Follow these instructions at home: Managing pain, stiffness, and swelling  If directed, put ice on the painful area. ? Put ice in a plastic bag. ? Place a towel between your skin and the bag. ? Leave the ice on for 20 minutes, 2-3 times a day.  Raise (elevate) your hip as much as you can without pain. To do this, put a pillow under your hips while you lie down.  If directed, apply heat to the affected area as often as told by your health care provider. Use the heat source that your health care provider recommends, such as a moist heat pack or  a heating pad. ? Place a towel between your skin and the heat source. ? Leave the heat on for 20-30 minutes. ? Remove the heat if your skin turns bright red. This is especially important if you are unable to feel pain, heat, or cold. You may have a greater risk of getting burned.      Activity  Do not use your hip to support your body weight until your health care provider says that you can. Use crutches, a cane, or a walker as told by your health care provider.  If the affected leg is one that you use  to drive, ask your health care provider if it is safe to drive.  Rest and protect your hip as much as possible until your pain and swelling get better.  Return to your normal activities as told by your health care provider. Ask your health care provider what activities are safe for you.  Do exercises as told by your health care provider. General instructions  Take over-the-counter and prescription medicines only as told by your health care provider.  Gently massage and stretch your injured area as often as is comfortable.  Wear compression wraps only as told by your health care provider.  If one of your legs is shorter than the other, get fitted for a shoe insert or orthotic.  Maintain a healthy weight. Follow instructions from your health care provider for weight control. These may include dietary restrictions.  Keep all follow-up visits as told by your health care provider. This is important. How is this prevented?  Exercise regularly, as told by your health care provider.  Wear supportive footwear that is appropriate for your sport.  Warm up and stretch before being active. Cool down and stretch after being active.  Take breaks regularly from repetitive activity.  If an activity irritates your hip or causes pain, avoid the activity as much as possible.  Avoid sitting down for long periods at a time. Where to find more information  American Academy of Orthopaedic Surgeons: orthoinfo.aaos.org Contact a health care provider if:  You have a fever.  You develop new symptoms.  You have trouble walking or doing everyday activities.  You have pain that gets worse or does not get better with medicine.  You develop red skin or a feeling of warmth in your hip area. Get help right away if:  You cannot move your hip.  You have severe pain.  You cannot control the muscles in your feet. Summary  Hip bursitis is the inflammation of one or more bursae in the hip joint.  Bursae are small fluid-filled sacs that absorb shock and prevent bones from rubbing against each other.  Hip bursitis can cause hip or groin pain, and symptoms often come and go over time.  This condition is often treated by resting, icing, applying pressure (compression), and raising (elevating) the injured area. Other treatments may be needed. This information is not intended to replace advice given to you by your health care provider. Make sure you discuss any questions you have with your health care provider. Document Revised: 07/08/2019 Document Reviewed: 05/14/2018 Elsevier Patient Education  2021 [employer].

## 2021-01-15 NOTE — Progress Notes (Signed)
Whitney Santiago DOB: 04-12-1961 Encounter date: 01/15/2021  This is a 60 y.o. female who presents for complete physical   History of present illness/Additional concerns: No worries for me today.   Hypertension: hctz 25, lisinopril 10mg  well controlled Hyperlipidemia: pravastatin 20mg  daily - no muscle aches/cramps with this.  Hyperglycemia: diet controlled; will recheck.   Follows regularly with Dr. Talbert Nan for gyn needs. utd with mammogram (last 07/2020)  Repeat colonoscopy due 11/2024.  Follows regularly with oncology for hx of hodgkin's disease with recurrent disease in 2017 after initial dx in 2000.  Past Medical History:  Diagnosis Date  . Allergy   . Anxiety    no med  . Depression    no med  . Fibroid   . History of migraine    Hx - last migraine 2 yrs ago, no longer a problem  . 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 Hx of iron deficiency anemia    Hx -per [facility] notes, since childhood, possible thalassemia  . Hyperglycemia   . Hyperlipemia    no med  . Hypertension   . LAD (lymphadenopathy), axillary    eval with onc and [facility] 2016  . Obesity   . STD (sexually transmitted disease)    gonorrhea about 30 years ago  . SVD (spontaneous vaginal delivery)    x 2   Past Surgical History:  Procedure Laterality Date  . AXILLARY LYMPH NODE BIOPSY Right 06/09/2016   Procedure: AXILLARY LYMPH NODE BIOPSY;  Surgeon: Jackolyn Confer, MD;  Location: [facility];  Service: General;  Laterality: Right;  . BREAST EXCISIONAL BIOPSY Left 2006  . BREAST SURGERY Left    due to bloody discharge  . COLONOSCOPY    . DILATATION & CURETTAGE/HYSTEROSCOPY WITH MYOSURE N/A 05/24/2016   Procedure: DILATATION & CURETTAGE/HYSTEROSCOPY WITH MYOSURE;  Surgeon: Salvadore Dom, MD;  Location: [facility];  Service: Gynecology;  Laterality: N/A;  . LAPAROSCOPIC REMOVAL ABDOMINAL MASS  2000   LLQ   Allergies  Allergen  Reactions  . Hydrocodone Hives  . Oxycodone Hives   No outpatient medications have been marked as taking for the 01/15/21 encounter (Office Visit) with Caren Macadam, MD.   Social History   Tobacco Use  . Smoking status: Never Smoker  . Smokeless tobacco: Never Used  Substance Use Topics  . Alcohol use: No    Alcohol/week: 0.0 standard drinks   Family History  Problem Relation Age of Onset  . Hypertension Mother   . Stroke Mother 65  . Hypertension Father   . Diabetes Father   . Hypertension Sister   . Liver disease Sister        related to infected appendix  . Diabetes Sister   . Hypertension Brother   . Diabetes Brother   . Hypertension Daughter   . Hypertension Sister   . Diabetes Sister   . Breast cancer Neg Hx      Review of Systems  Constitutional: Negative for activity change, appetite change, chills, fatigue, fever and unexpected weight change.  HENT: Negative for congestion, ear pain, hearing loss, sinus pressure, sinus pain, sore throat and trouble swallowing.   Eyes: Negative for pain and visual disturbance.  Respiratory: Negative for cough, chest tightness, shortness of breath and wheezing.   Cardiovascular: Negative for chest pain, palpitations and leg swelling.  Gastrointestinal: Negative for abdominal pain, blood in stool, constipation, diarrhea, nausea and vomiting.  Genitourinary: Negative for difficulty urinating  and menstrual problem.  Musculoskeletal: Negative for arthralgias and back pain.  Skin: Positive for rash.       States that since childhood she thinks she has had eczema.  She typically uses Vaseline to help with this.  Has had it all over her back for years.  It itches, but she feels that it is decently managed with as needed Vaseline use.  Neurological: Negative for dizziness, weakness, numbness and headaches.  Hematological: Negative for adenopathy. Does not bruise/bleed easily.  Psychiatric/Behavioral: Negative for sleep disturbance  and suicidal ideas. The patient is not nervous/anxious.     CBC:  Lab Results  Component Value Date   WBC 9.4 07/31/2020   WBC 9.6 07/03/2020   HGB 12.3 07/31/2020   HGB 11.6 02/27/2017   HGB 12.2 06/23/2016   HCT 37.6 07/31/2020   HCT 33.5 (L) 02/27/2017   HCT 37.6 06/23/2016   MCH 28.5 07/31/2020   MCHC 32.7 07/31/2020   RDW 14.0 07/31/2020   RDW 15.0 02/27/2017   RDW 14.5 06/23/2016   PLT 379 07/31/2020   PLT 382 (H) 02/27/2017   MPV 9.1 07/03/2020   CMP: Lab Results  Component Value Date   NA 140 07/31/2020   NA 140 06/23/2016   K 3.9 07/31/2020   K 3.8 06/23/2016   CL 102 07/31/2020   CO2 31 07/31/2020   CO2 28 06/23/2016   ANIONGAP 7 07/31/2020   GLUCOSE 134 (H) 07/31/2020   GLUCOSE 127 06/23/2016   BUN 9 07/31/2020   BUN 12.2 06/23/2016   CREATININE 0.84 07/31/2020   CREATININE 0.70 07/03/2020   CREATININE 0.9 06/23/2016   GFRAA >60 06/01/2016   CALCIUM 10.1 07/31/2020   CALCIUM 10.2 06/23/2016   PROT 8.0 07/31/2020   PROT 7.3 02/27/2017   PROT 7.8 06/23/2016   BILITOT 0.6 07/31/2020   BILITOT 0.38 06/23/2016   ALKPHOS 82 07/31/2020   ALKPHOS 91 06/23/2016   ALT 23 07/31/2020   ALT 15 06/23/2016   AST 25 07/31/2020   AST 17 06/23/2016   LIPID: Lab Results  Component Value Date   CHOL 197 07/03/2020   TRIG 64 07/03/2020   HDL 63 07/03/2020   LDLCALC 118 (H) 07/03/2020    Objective:  BP 130/80   Pulse 71   Temp 97.9 F (36.6 C) (Oral)   Ht 5' 3.75" (1.619 m)   Wt 206 lb 8 oz (93.7 kg)   LMP 10/08/2013   SpO2 99%   BMI 35.72 kg/m   Weight: 206 lb 8 oz (93.7 kg)   BP Readings from Last 3 Encounters:  01/15/21 130/80  10/22/20 140/62  08/27/20 130/82   Wt Readings from Last 3 Encounters:  01/15/21 206 lb 8 oz (93.7 kg)  10/22/20 215 lb (97.5 kg)  08/27/20 215 lb (97.5 kg)    Physical Exam Constitutional:      General: She is not in acute distress.    Appearance: She is well-developed. She is obese.  HENT:     Head:  Normocephalic and atraumatic.     Right Ear: External ear normal.     Left Ear: External ear normal.     Mouth/Throat:     Pharynx: No oropharyngeal exudate.  Eyes:     Conjunctiva/sclera: Conjunctivae normal.     Pupils: Pupils are equal, round, and reactive to light.  Neck:     Thyroid: No thyromegaly.  Cardiovascular:     Rate and Rhythm: Normal rate and regular rhythm.  Heart sounds: Normal heart sounds. No murmur heard. No friction rub. No gallop.   Pulmonary:     Effort: Pulmonary effort is normal.     Breath sounds: Normal breath sounds.  Abdominal:     General: Bowel sounds are normal. There is no distension.     Palpations: Abdomen is soft. There is no mass.     Tenderness: There is no abdominal tenderness. There is no guarding.     Hernia: No hernia is present.  Musculoskeletal:        General: No tenderness or deformity. Normal range of motion.     Cervical back: Normal range of motion and neck supple.  Lymphadenopathy:     Cervical: No cervical adenopathy.  Skin:    General: Skin is warm and dry.     Findings: No rash.     Comments: She does have some scaling and slight hypopigmentation on back.  She also has small 2 cm area right antecubital fossa with some slight hyperpigmentation and dryness.  Skin generally is dry.  Neurological:     Mental Status: She is alert and oriented to person, place, and time.     Deep Tendon Reflexes: Reflexes normal.     Reflex Scores:      Tricep reflexes are 2+ on the right side and 2+ on the left side.      Bicep reflexes are 2+ on the right side and 2+ on the left side.      Brachioradialis reflexes are 2+ on the right side and 2+ on the left side.      Patellar reflexes are 2+ on the right side and 2+ on the left side. Psychiatric:        Speech: Speech normal.        Behavior: Behavior normal.        Thought Content: Thought content normal.     Assessment/Plan: Health Maintenance Due  Topic Date Due  . COVID-19  Vaccine (4 - Booster for Moderna series) 08/19/2020   Health Maintenance reviewed.  1. Preventative health care Discussed importance of regular exercise and low carbohydrate eating to help maintain blood sugars.  Completed Shingrix today.  2. Essential hypertension Improved blood pressure on recheck.  Continue current medications: Lisinopril 10 mg, hydrochlorothiazide 25 mg daily. - CBC with Differential/Platelet; Future - Comprehensive metabolic panel; Future  3. Hyperlipidemia, unspecified hyperlipidemia type Continue with pravastatin 40 mg daily. - Lipid panel; Future  4. Hyperglycemia We will recheck labs today.  She has been able to control sugars through diet, but I encouraged her to continue to watch carbohydrate intake to help maintain this. - Hemoglobin A1c; Future - Microalbumin / creatinine urine ratio; Future - HM DIABETES FOOT EXAM  5. 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  Rechecking blood counts today.  Return in about 6 months (around 07/17/2021) for Chronic condition visit.  Micheline Rough, MD

## 2021-01-20 ENCOUNTER — Other Ambulatory Visit: Payer: Self-pay | Admitting: Family Medicine

## 2021-02-03 ENCOUNTER — Telehealth: Payer: Self-pay | Admitting: Oncology

## 2021-02-03 NOTE — Telephone Encounter (Signed)
R/s appt per 5/18 sch msg. Pt aware.  

## 2021-02-05 ENCOUNTER — Ambulatory Visit: Payer: BC Managed Care – PPO | Admitting: Oncology

## 2021-02-05 ENCOUNTER — Other Ambulatory Visit: Payer: BC Managed Care – PPO

## 2021-02-10 ENCOUNTER — Other Ambulatory Visit: Payer: Self-pay | Admitting: Family Medicine

## 2021-02-19 ENCOUNTER — Ambulatory Visit: Payer: BC Managed Care – PPO | Admitting: Family Medicine

## 2021-03-19 ENCOUNTER — Inpatient Hospital Stay: Payer: BC Managed Care – PPO | Attending: Oncology

## 2021-03-19 ENCOUNTER — Other Ambulatory Visit: Payer: Self-pay

## 2021-03-19 ENCOUNTER — Inpatient Hospital Stay (HOSPITAL_BASED_OUTPATIENT_CLINIC_OR_DEPARTMENT_OTHER): Payer: BC Managed Care – PPO | Admitting: Oncology

## 2021-03-19 VITALS — BP 153/79 | HR 92 | Temp 98.1°F | Resp 18 | Ht 63.0 in | Wt 208.5 lb

## 2021-03-19 DIAGNOSIS — C8103 Nodular lymphocyte predominant Hodgkin lymphoma, intra-abdominal lymph nodes: Secondary | ICD-10-CM | POA: Diagnosis not present

## 2021-03-19 DIAGNOSIS — Z8571 Personal history of Hodgkin lymphoma: Secondary | ICD-10-CM | POA: Insufficient documentation

## 2021-03-19 DIAGNOSIS — Z923 Personal history of irradiation: Secondary | ICD-10-CM | POA: Diagnosis not present

## 2021-03-19 DIAGNOSIS — D649 Anemia, unspecified: Secondary | ICD-10-CM | POA: Insufficient documentation

## 2021-03-19 LAB — CMP (CANCER CENTER ONLY)
ALT: 17 U/L (ref 0–44)
AST: 18 U/L (ref 15–41)
Albumin: 3.5 g/dL (ref 3.5–5.0)
Alkaline Phosphatase: 74 U/L (ref 38–126)
Anion gap: 10 (ref 5–15)
BUN: 15 mg/dL (ref 6–20)
CO2: 26 mmol/L (ref 22–32)
Calcium: 9.5 mg/dL (ref 8.9–10.3)
Chloride: 103 mmol/L (ref 98–111)
Creatinine: 0.79 mg/dL (ref 0.44–1.00)
GFR, Estimated: >60 mL/min (ref >60)
Glucose, Bld: 132 mg/dL — ABNORMAL HIGH (ref 70–99)
Potassium: 3.5 mmol/L (ref 3.5–5.1)
Sodium: 139 mmol/L (ref 135–145)
Total Bilirubin: 0.4 mg/dL (ref 0.3–1.2)
Total Protein: 7.4 g/dL (ref 6.5–8.1)

## 2021-03-19 LAB — CBC WITH DIFFERENTIAL (CANCER CENTER ONLY)
Abs Immature Granulocytes: 0.02 10*3/uL (ref 0.00–0.07)
Basophils Absolute: 0 10*3/uL (ref 0.0–0.1)
Basophils Relative: 0 %
Eosinophils Absolute: 0.1 10*3/uL (ref 0.0–0.5)
Eosinophils Relative: 1 %
HCT: 35.4 % — ABNORMAL LOW (ref 36.0–46.0)
Hemoglobin: 11.7 g/dL — ABNORMAL LOW (ref 12.0–15.0)
Immature Granulocytes: 0 %
Lymphocytes Relative: 36 %
Lymphs Abs: 3.4 10*3/uL (ref 0.7–4.0)
MCH: 28.8 pg (ref 26.0–34.0)
MCHC: 33.1 g/dL (ref 30.0–36.0)
MCV: 87.2 fL (ref 80.0–100.0)
Monocytes Absolute: 0.5 10*3/uL (ref 0.1–1.0)
Monocytes Relative: 5 %
Neutro Abs: 5.6 10*3/uL (ref 1.7–7.7)
Neutrophils Relative %: 58 %
Platelet Count: 362 10*3/uL (ref 150–400)
RBC: 4.06 MIL/uL (ref 3.87–5.11)
RDW: 13.9 % (ref 11.5–15.5)
WBC Count: 9.6 10*3/uL (ref 4.0–10.5)
nRBC: 0 % (ref 0.0–0.2)

## 2021-03-19 NOTE — Progress Notes (Signed)
Hematology and Oncology Follow Up Visit  Whitney Santiago [id number] 08-Sep-1961 60 y.o. 03/19/2021 3:03 PM Whitney Santiago, MDKoberlein, Steele Berg, MD   Principle Diagnosis: 60 year old woman with lymphocyte predominant Hodgkin's disease diagnosed in 2001.   Prior Therapy: She was treated at [facility] with rituximab concomitantly with chlorambucil orally. She appeared to have a good response and remained disease free between 2001 and 2015.  She developed recurrent disease with axillary lymph node involvement with indolent course in 2017 and has not required treatment.  Current therapy: Active surveillance.  Interim History: Ms. Whitney Santiago is here for a follow-up visit.  Since the last visit, she reports no major complaints at this time.  She denies any recent hospitalization or illnesses.  She denies any lymphadenopathy, fevers or any constitutional symptoms.  Her performance status and quality of life remained excellent.  She received the shingles vaccine which caused some minor symptoms that has resolved spontaneously.  Medications: Updated on review. Current Outpatient Medications  Medication Sig Dispense Refill   Calcium Carb-Cholecalciferol (CALCIUM + D3 PO) Take 1 tablet by mouth daily.      Calcium Carbonate-Vitamin D 600-200 MG-UNIT TABS Take 600 mg by mouth daily.     Cholecalciferol (VITAMIN D3) 2000 UNITS TABS Take 2,000 Units by mouth daily.      hydrochlorothiazide (HYDRODIURIL) 25 MG tablet TAKE 1 TABLET BY MOUTH EVERY DAY 90 tablet 1   lisinopril (ZESTRIL) 10 MG tablet TAKE 1 TABLET BY MOUTH EVERY DAY 90 tablet 1   pravastatin (PRAVACHOL) 40 MG tablet TAKE 1 TABLET BY MOUTH EVERY DAY 90 tablet 1   No current facility-administered medications for this visit.     Allergies:  Allergies  Allergen Reactions   Hydrocodone Hives   Oxycodone Hives       Physical Exam:   Blood pressure (!) 153/79, pulse 92, temperature 98.1 F (36.7 C), temperature  source Oral, resp. rate 18, height 5\' 3"  (1.6 m), weight 208 lb 8 oz (94.6 kg), last menstrual period 10/08/2013, SpO2 100 %.    ECOG: 0    General appearance: Alert, awake without any distress. Head: Atraumatic without abnormalities Oropharynx: Without any thrush or ulcers. Eyes: No scleral icterus. Lymph nodes: No lymphadenopathy noted in the cervical, supraclavicular, or axillary nodes Heart:regular rate and rhythm, without any murmurs or gallops.   Lung: Clear to auscultation without any rhonchi, wheezes or dullness to percussion. Abdomin: Soft, nontender without any shifting dullness or ascites. Musculoskeletal: No clubbing or cyanosis. Neurological: No motor or sensory deficits. Skin: No rashes or lesions.    Lab Results: Lab Results  Component Value Date   WBC 9.6 03/19/2021   HGB 11.7 (L) 03/19/2021   HCT 35.4 (L) 03/19/2021   MCV 87.2 03/19/2021   PLT 362 03/19/2021     Chemistry      Component Value Date/Time   NA 140 01/15/2021 1012   NA 140 06/23/2016 0855   K 3.7 01/15/2021 1012   K 3.8 06/23/2016 0855   CL 102 01/15/2021 1012   CO2 31 01/15/2021 1012   CO2 28 06/23/2016 0855   BUN 15 01/15/2021 1012   BUN 12.2 06/23/2016 0855   CREATININE 0.70 01/15/2021 1012   CREATININE 0.84 07/31/2020 1218   CREATININE 0.70 07/03/2020 1122   CREATININE 0.9 06/23/2016 0855      Component Value Date/Time   CALCIUM 9.9 01/15/2021 1012   CALCIUM 10.2 06/23/2016 0855   ALKPHOS 72 01/15/2021 1012   ALKPHOS 91 06/23/2016 0855  AST 20 01/15/2021 1012   AST 25 07/31/2020 1218   AST 17 06/23/2016 0855   ALT 17 01/15/2021 1012   ALT 23 07/31/2020 1218   ALT 15 06/23/2016 0855   BILITOT 0.5 01/15/2021 1012   BILITOT 0.6 07/31/2020 1218   BILITOT 0.38 06/23/2016 0855         Impression and Plan:   60 year old woman with:  1.  Lymphocyte predominant Hodgkin's disease diagnosed in 2001.  Her disease status was updated at this time and treatment choices  were reviewed.  He developed recurrent disease in her axilla in 2017 with indolent course and has not required any treatment.  She remains under active surveillance without any evidence of relapsed disease.  Physical examination laboratory testing today did not indicate any recurrence.  I recommended continued active surveillance with repeat imaging studies she develops any symptoms.  Salvage therapy at that time including local radiation versus systemic therapy with rituximab.  2.  Anemia: Mild and fluctuating in nature.  Her hemoglobin today is close to normal range.  3.  Lower abdominal discomfort: Resolved at this time.  Imaging studies from November 2021 did not reveal any pathology.  4. Follow-up: She will return in 6 months for a follow-up.  30  minutes were spent on this visit.  Time was dedicated to reviewing laboratory data, disease status update, management choices in the future as complication related therapy.   [name], MD 7/1/20223:03 PM

## 2021-04-13 ENCOUNTER — Encounter: Payer: Self-pay | Admitting: Family Medicine

## 2021-07-19 ENCOUNTER — Ambulatory Visit: Payer: BC Managed Care – PPO | Admitting: Family Medicine

## 2021-07-24 ENCOUNTER — Other Ambulatory Visit: Payer: Self-pay | Admitting: Family Medicine

## 2021-07-26 ENCOUNTER — Ambulatory Visit: Payer: BC Managed Care – PPO | Admitting: Family Medicine

## 2021-07-26 ENCOUNTER — Encounter: Payer: Self-pay | Admitting: Family Medicine

## 2021-07-26 ENCOUNTER — Other Ambulatory Visit: Payer: Self-pay

## 2021-07-26 VITALS — BP 102/80 | HR 77 | Temp 99.0°F | Ht 63.0 in | Wt 210.4 lb

## 2021-07-26 DIAGNOSIS — E785 Hyperlipidemia, unspecified: Secondary | ICD-10-CM | POA: Diagnosis not present

## 2021-07-26 DIAGNOSIS — I10 Essential (primary) hypertension: Secondary | ICD-10-CM | POA: Diagnosis not present

## 2021-07-26 DIAGNOSIS — C8103 Nodular lymphocyte predominant Hodgkin lymphoma, intra-abdominal lymph nodes: Secondary | ICD-10-CM

## 2021-07-26 DIAGNOSIS — Z23 Encounter for immunization: Secondary | ICD-10-CM | POA: Diagnosis not present

## 2021-07-26 DIAGNOSIS — E1165 Type 2 diabetes mellitus with hyperglycemia: Secondary | ICD-10-CM | POA: Diagnosis not present

## 2021-07-26 LAB — COMPREHENSIVE METABOLIC PANEL
ALT: 19 U/L (ref 0–35)
AST: 21 U/L (ref 0–37)
Albumin: 4.3 g/dL (ref 3.5–5.2)
Alkaline Phosphatase: 73 U/L (ref 39–117)
BUN: 13 mg/dL (ref 6–23)
CO2: 30 mEq/L (ref 19–32)
Calcium: 9.6 mg/dL (ref 8.4–10.5)
Chloride: 101 mEq/L (ref 96–112)
Creatinine, Ser: 0.71 mg/dL (ref 0.40–1.20)
GFR: 93.66 mL/min (ref >60.00)
Glucose, Bld: 130 mg/dL — ABNORMAL HIGH (ref 70–99)
Potassium: 3.5 mEq/L (ref 3.5–5.1)
Sodium: 139 mEq/L (ref 135–145)
Total Bilirubin: 0.6 mg/dL (ref 0.2–1.2)
Total Protein: 7.7 g/dL (ref 6.0–8.3)

## 2021-07-26 LAB — CBC WITH DIFFERENTIAL/PLATELET
Basophils Absolute: 0 10*3/uL (ref 0.0–0.1)
Basophils Relative: 0.3 % (ref 0.0–3.0)
Eosinophils Absolute: 0 10*3/uL (ref 0.0–0.7)
Eosinophils Relative: 0.6 % (ref 0.0–5.0)
HCT: 36.8 % (ref 36.0–46.0)
Hemoglobin: 12.1 g/dL (ref 12.0–15.0)
Lymphocytes Relative: 34.5 % (ref 12.0–46.0)
Lymphs Abs: 3 10*3/uL (ref 0.7–4.0)
MCHC: 32.9 g/dL (ref 30.0–36.0)
MCV: 86.5 fl (ref 78.0–100.0)
Monocytes Absolute: 0.5 10*3/uL (ref 0.1–1.0)
Monocytes Relative: 5.8 % (ref 3.0–12.0)
Neutro Abs: 5.2 10*3/uL (ref 1.4–7.7)
Neutrophils Relative %: 58.8 % (ref 43.0–77.0)
Platelets: 375 10*3/uL (ref 150.0–400.0)
RBC: 4.25 Mil/uL (ref 3.87–5.11)
RDW: 15.1 % (ref 11.5–15.5)
WBC: 8.8 10*3/uL (ref 4.0–10.5)

## 2021-07-26 LAB — LIPID PANEL
Cholesterol: 176 mg/dL (ref 0–200)
HDL: 57.1 mg/dL (ref >39.00)
LDL Cholesterol: 107 mg/dL — ABNORMAL HIGH (ref 0–99)
NonHDL: 119.21
Total CHOL/HDL Ratio: 3
Triglycerides: 60 mg/dL (ref 0.0–149.0)
VLDL: 12 mg/dL (ref 0.0–40.0)

## 2021-07-26 LAB — MICROALBUMIN / CREATININE URINE RATIO
Creatinine,U: 194.7 mg/dL
Microalb Creat Ratio: 0.9 mg/g (ref 0.0–30.0)
Microalb, Ur: 1.8 mg/dL (ref 0.0–1.9)

## 2021-07-26 LAB — HEMOGLOBIN A1C: Hgb A1c MFr Bld: 6.4 % (ref 4.6–6.5)

## 2021-07-26 NOTE — Addendum Note (Signed)
Addended by: Caren Macadam on: 07/26/2021 09:30 AM   Modules accepted: Orders

## 2021-07-26 NOTE — Addendum Note (Signed)
Addended by: Agnes Lawrence on: 07/26/2021 09:44 AM   Modules accepted: Orders

## 2021-07-26 NOTE — Progress Notes (Signed)
Whitney Santiago DOB: 1961/03/14 Encounter date: 07/26/2021  This is a 60 y.o. female who presents with Chief Complaint  Patient presents with   Follow-up    History of present illness: *she did feel badly after first shingles vaccine - was feeling unwell for at least 4 days - flu like symptoms.   HTN: not checking at home. Good about lisinopril 10mg  hctz 25mg  regularly.  Elevated glucose: has been diet controlled.   Working two jobs still. Planning to work a couple more years and then will be doing more   Follows with oncology regularly for surveillance for hodgkin's disease.    Allergies  Allergen Reactions   Hydrocodone Hives   Oxycodone Hives   Current Meds  Medication Sig   Calcium Carb-Cholecalciferol (CALCIUM + D3 PO) Take 1 tablet by mouth daily.    Calcium Carbonate-Vitamin D 600-200 MG-UNIT TABS Take 600 mg by mouth daily.   Cholecalciferol (VITAMIN D3) 2000 UNITS TABS Take 2,000 Units by mouth daily.    hydrochlorothiazide (HYDRODIURIL) 25 MG tablet TAKE 1 TABLET BY MOUTH EVERY DAY   lisinopril (ZESTRIL) 10 MG tablet TAKE 1 TABLET BY MOUTH EVERY DAY   pravastatin (PRAVACHOL) 40 MG tablet TAKE 1 TABLET BY MOUTH EVERY DAY    Review of Systems  Constitutional:  Negative for chills, fatigue and fever.  Respiratory:  Negative for cough, chest tightness, shortness of breath and wheezing.   Cardiovascular:  Negative for chest pain, palpitations and leg swelling.   Objective:  BP 102/80 (BP Location: Left Arm, Patient Position: Sitting, Cuff Size: Large)   Pulse 77   Temp 99 F (37.2 C) (Oral)   Ht 5\' 3"  (1.6 m)   Wt 210 lb 6.4 oz (95.4 kg)   LMP 10/08/2013   SpO2 96%   BMI 37.27 kg/m   Weight: 210 lb 6.4 oz (95.4 kg)   BP Readings from Last 3 Encounters:  07/26/21 102/80  03/19/21 (!) 153/79  01/15/21 130/80   Wt Readings from Last 3 Encounters:  07/26/21 210 lb 6.4 oz (95.4 kg)  03/19/21 208 lb 8 oz (94.6 kg)  01/15/21 206 lb 8 oz (93.7 kg)     Physical Exam Constitutional:      General: She is not in acute distress.    Appearance: She is well-developed.  HENT:     Head: Normocephalic and atraumatic.  Cardiovascular:     Rate and Rhythm: Normal rate and regular rhythm.     Heart sounds: Normal heart sounds. No murmur heard. Pulmonary:     Effort: Pulmonary effort is normal.     Breath sounds: Normal breath sounds.  Abdominal:     General: Bowel sounds are normal. There is no distension.     Palpations: Abdomen is soft.     Tenderness: There is no abdominal tenderness. There is no guarding.  Feet:     Comments: Normal bilateral monofilament foot exam.  Mild heel callus.  Skin is intact. Skin:    General: Skin is warm and dry.     Comments: Sensory exam of the foot is normal, tested with the monofilament. Good pulses, no lesions or ulcers, good peripheral pulses.  Psychiatric:        Judgment: Judgment normal.    Assessment/Plan  1. Essential hypertension Well controlled with lisinopril 10mg  and hctz 25mg .   2. 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 No active treatment.   3. Controlled type 2 diabetes mellitus with hyperglycemia,  without long-term current use of insulin (HCC) Diet and exercise controlled. Could consider treatment pending lab results.   4. Hyperlipidemia, unspecified hyperlipidemia type Pravastatin 40mg ; has been well controlled. Recheck today.   Return in about 6 months (around 01/23/2022) for physical exam.      Micheline Rough, MD

## 2021-07-26 NOTE — Progress Notes (Deleted)
60 y.o. [id number] Divorced Black or African American Not Hispanic or Latino female here for annual exam.      Patient's last menstrual period was 10/08/2013.          Sexually active: {yes no:314532}  The current method of family planning is post menopausal status.    Exercising: {yes no:314532}  {types:19826} Smoker:  {YES [id number]  Health Maintenance: Pap:  07/16/20- WNL, HPV- neg, 04/06/16- WNL, HPV- neg  History of abnormal Pap:  no MMG:  07/24/20- birads 1  BMD:   n/a Colonoscopy: 11/28/2014- f/u in 10 years  TDaP:  06/26/2014 Gardasil: n/a   reports that she has never smoked. She has never used smokeless tobacco. She reports that she does not drink alcohol and does not use drugs.  Past Medical History:  Diagnosis Date   Allergy    Anxiety    no med   Depression    no med   Fibroid    History of migraine    Hx - last migraine 2 yrs ago, no longer a problem   Hodgkin disease ([place]) 1999   lymphocytic predominant Hodkin's 2000, s/p 4 weeks Rituxan, chlorambucil 10/21/99-04/19/00   Hx of iron deficiency anemia    Hx -per [facility] notes, since childhood, possible thalassemia   Hyperglycemia    Hyperlipemia    no med   Hypertension    LAD (lymphadenopathy), axillary    eval with onc and [facility] 2016   Obesity    STD (sexually transmitted disease)    gonorrhea about 30 years ago   SVD (spontaneous vaginal delivery)    x 2    Past Surgical History:  Procedure Laterality Date   AXILLARY LYMPH NODE BIOPSY Right 06/09/2016   Procedure: AXILLARY LYMPH NODE BIOPSY;  Surgeon: Jackolyn Confer, MD;  Location: [facility];  Service: General;  Laterality: Right;   BREAST EXCISIONAL BIOPSY Left 2006   BREAST SURGERY Left    due to bloody discharge   COLONOSCOPY     [place] N/A 05/24/2016   Procedure: [place];  Surgeon: Salvadore Dom, MD;  Location: [facility];  Service: Gynecology;  Laterality: N/A;   LAPAROSCOPIC  REMOVAL ABDOMINAL MASS  2000   LLQ    Current Outpatient Medications  Medication Sig Dispense Refill   Calcium Carb-Cholecalciferol (CALCIUM + D3 PO) Take 1 tablet by mouth daily.      Calcium Carbonate-Vitamin D 600-200 MG-UNIT TABS Take 600 mg by mouth daily.     Cholecalciferol (VITAMIN D3) 2000 UNITS TABS Take 2,000 Units by mouth daily.      hydrochlorothiazide (HYDRODIURIL) 25 MG tablet TAKE 1 TABLET BY MOUTH EVERY DAY 90 tablet 1   lisinopril (ZESTRIL) 10 MG tablet TAKE 1 TABLET BY MOUTH EVERY DAY 90 tablet 1   pravastatin (PRAVACHOL) 40 MG tablet TAKE 1 TABLET BY MOUTH EVERY DAY 90 tablet 1   No current facility-administered medications for this visit.    Family History  Problem Relation Age of Onset   Hypertension Mother    Stroke Mother 8   Hypertension Father    Diabetes Father    Hypertension Sister    Liver disease Sister        related to infected appendix   Diabetes Sister    Hypertension Brother    Diabetes Brother    Hypertension Daughter    Hypertension Sister    Diabetes Sister    Breast cancer Neg Hx  Review of Systems  Exam:   LMP 10/08/2013   Weight change: @WEIGHTCHANGE @ Height:      Ht Readings from Last 3 Encounters:  07/26/21 5\' 3"  (1.6 m)  03/19/21 5\' 3"  (1.6 m)  01/15/21 5' 3.75" (1.619 m)    General appearance: alert, cooperative and appears stated age Head: Normocephalic, without obvious abnormality, atraumatic Neck: no adenopathy, supple, symmetrical, trachea midline and thyroid {CHL AMB PHY EX THYROID NORM DEFAULT:[id number]::"normal to inspection and palpation"} Lungs: clear to auscultation bilaterally Cardiovascular: regular rate and rhythm Breasts: {Exam; breast:13139::"normal appearance, no masses or tenderness"} Abdomen: soft, non-tender; non distended,  no masses,  no organomegaly Extremities: extremities normal, atraumatic, no cyanosis or edema Skin: Skin color, texture, turgor normal. No rashes or lesions Lymph nodes:  Cervical, supraclavicular, and axillary nodes normal. No abnormal inguinal nodes palpated Neurologic: Grossly normal   Pelvic: External genitalia:  no lesions              Urethra:  normal appearing urethra with no masses, tenderness or lesions              Bartholins and Skenes: normal                 Vagina: normal appearing vagina with normal color and discharge, no lesions              Cervix: {CHL AMB PHY EX CERVIX NORM DEFAULT:[phone]::"no lesions"}               Bimanual Exam:  Uterus:  {CHL AMB PHY EX UTERUS NORM DEFAULT:[phone]::"normal size, contour, position, consistency, mobility, non-tender"}              Adnexa: {CHL AMB PHY EX ADNEXA NO MASS DEFAULT:[phone]::"no mass, fullness, tenderness"}               Rectovaginal: Confirms               Anus:  normal sphincter tone, no lesions  *** chaperoned for the exam.  A:  Well Woman with normal exam  P:

## 2021-07-26 NOTE — Addendum Note (Signed)
Addended by: Amanda Cockayne on: 07/26/2021 09:34 AM   Modules accepted: Orders

## 2021-07-28 ENCOUNTER — Telehealth: Payer: Self-pay | Admitting: Family Medicine

## 2021-07-28 MED ORDER — ROSUVASTATIN CALCIUM 5 MG PO TABS: 5.0000 mg | ORAL_TABLET | Freq: Every day | ORAL | 1 refills | Status: DC

## 2021-07-28 NOTE — Telephone Encounter (Signed)
Patient called again to ask for another callback from [place]. Patient states she needs to explain herself about something previously spoken about. Patient tried to explain to me so I could put it in the message but was unable to word it to her liking and just wanted a callback.    Good callback number is [phone]    Please advise

## 2021-07-28 NOTE — Addendum Note (Signed)
Addended by: Agnes Lawrence on: 07/28/2021 01:31 PM   Modules accepted: Orders

## 2021-07-28 NOTE — Telephone Encounter (Signed)
Patient called to follow up on missed call for lab results.    Good callback number is [phone]    Please advise

## 2021-07-28 NOTE — Telephone Encounter (Signed)
Left a message for the patient to return my call.  

## 2021-07-28 NOTE — Telephone Encounter (Signed)
Patient informed-see results note. 

## 2021-07-29 ENCOUNTER — Ambulatory Visit: Payer: BC Managed Care – PPO | Admitting: Obstetrics and Gynecology

## 2021-07-30 NOTE — Telephone Encounter (Signed)
Spoke with the patient and she stated she wanted to make sure I was aware she was not upset about Dr Ethlyn Gallery stating she gained 7lbs at the last visit and thought her glucose was up due to this also.  Patient was assured there was no problem with her mentioning the weight gain. Nothing further needed.

## 2021-08-03 ENCOUNTER — Other Ambulatory Visit: Payer: Self-pay | Admitting: Family Medicine

## 2021-08-03 DIAGNOSIS — Z1231 Encounter for screening mammogram for malignant neoplasm of breast: Secondary | ICD-10-CM

## 2021-08-20 ENCOUNTER — Other Ambulatory Visit: Payer: Self-pay | Admitting: Family Medicine

## 2021-09-03 ENCOUNTER — Ambulatory Visit: Payer: BC Managed Care – PPO

## 2021-09-07 ENCOUNTER — Telehealth: Payer: Self-pay | Admitting: *Deleted

## 2021-09-07 NOTE — Telephone Encounter (Signed)
Patient was seen for PMB on 08/27/20, [facility] recommended. Patient did not return for [facility]. No AEX scheduled.   Call placed to patient, Left message to call Sharee Pimple, RN at [place], [phone], option 5.

## 2021-09-17 ENCOUNTER — Inpatient Hospital Stay: Payer: BC Managed Care – PPO | Admitting: Oncology

## 2021-09-17 ENCOUNTER — Inpatient Hospital Stay: Payer: BC Managed Care – PPO

## 2021-09-17 ENCOUNTER — Other Ambulatory Visit: Payer: BC Managed Care – PPO

## 2021-10-01 ENCOUNTER — Ambulatory Visit: Payer: BC Managed Care – PPO

## 2021-10-18 NOTE — Telephone Encounter (Signed)
Left message to call Lailyn Appelbaum, RN at [facility], [phone].  

## 2021-10-22 ENCOUNTER — Ambulatory Visit
Admission: RE | Admit: 2021-10-22 | Discharge: 2021-10-22 | Disposition: A | Discharge status: Home / Self Care | Payer: BC Managed Care – PPO | Source: Ambulatory Visit | Attending: Family Medicine | Admitting: Family Medicine

## 2021-10-22 DIAGNOSIS — Z1231 Encounter for screening mammogram for malignant neoplasm of breast: Secondary | ICD-10-CM

## 2021-10-27 ENCOUNTER — Ambulatory Visit: Payer: BC Managed Care – PPO | Admitting: Family Medicine

## 2021-10-27 ENCOUNTER — Encounter: Payer: Self-pay | Admitting: Family Medicine

## 2021-10-27 VITALS — BP 140/78 | HR 95 | Temp 98.2°F | Resp 16 | Ht 63.0 in | Wt 212.8 lb

## 2021-10-27 DIAGNOSIS — E785 Hyperlipidemia, unspecified: Secondary | ICD-10-CM

## 2021-10-27 DIAGNOSIS — H9313 Tinnitus, bilateral: Secondary | ICD-10-CM

## 2021-10-27 DIAGNOSIS — R202 Paresthesia of skin: Secondary | ICD-10-CM | POA: Diagnosis not present

## 2021-10-27 LAB — BASIC METABOLIC PANEL
BUN: 17 mg/dL (ref 6–23)
CO2: 28 mEq/L (ref 19–32)
Calcium: 9.9 mg/dL (ref 8.4–10.5)
Chloride: 98 mEq/L (ref 96–112)
Creatinine, Ser: 0.78 mg/dL (ref 0.40–1.20)
GFR: 83.51 mL/min (ref >60.00)
Glucose, Bld: 141 mg/dL — ABNORMAL HIGH (ref 70–99)
Potassium: 3.7 mEq/L (ref 3.5–5.1)
Sodium: 136 mEq/L (ref 135–145)

## 2021-10-27 LAB — CBC
HCT: 37.4 % (ref 36.0–46.0)
Hemoglobin: 12.2 g/dL (ref 12.0–15.0)
MCHC: 32.5 g/dL (ref 30.0–36.0)
MCV: 86.5 fl (ref 78.0–100.0)
Platelets: 376 10*3/uL (ref 150.0–400.0)
RBC: 4.32 Mil/uL (ref 3.87–5.11)
RDW: 14.9 % (ref 11.5–15.5)
WBC: 8.1 10*3/uL (ref 4.0–10.5)

## 2021-10-27 LAB — TSH: TSH: 0.83 u[IU]/mL (ref 0.35–5.50)

## 2021-10-27 LAB — VITAMIN D 25 HYDROXY (VIT D DEFICIENCY, FRACTURES): VITD: 37.17 ng/mL (ref 30.00–100.00)

## 2021-10-27 LAB — VITAMIN B12: Vitamin B-12: 819 pg/mL (ref 211–911)

## 2021-10-27 NOTE — Progress Notes (Signed)
ACUTE VISIT Chief Complaint  Patient presents with   Tingling all over    Pt c/o tingling all over for a week after putting drano in her sink she thinks she inhaled some of it which she thinks may have triggered it along with ringing in her ears.   HPI: [name] is a 61 y.o. female with hx of Hodgkin lymphoma who is here today complaining of generalized tingling sensation as described above. No associated fever,chills,night sweats,abnormal wt loss,cough,wheezing,SOB, or headache. She has not noted skin color changes.  Problem is constant. Stable. She has not identified exacerbating or alleviating factors.  Last night she noted ringing in both ears. Negative for hearing loss, earache,nausea,or dizziness. No recent illness or trouble.  Sometimes lower back pain, no more than usual and not radiated.  She started Rosuvastatin started 2 months ago. She has had more stress than usual and wonders if this may be causing tingling.  Review of Systems  Constitutional:  Negative for activity change, appetite change and fatigue.  HENT:  Negative for mouth sores, nosebleeds and sore throat.   Eyes:  Negative for redness and visual disturbance.  Cardiovascular:  Negative for chest pain, palpitations and leg swelling.  Gastrointestinal:  Negative for abdominal pain and vomiting.       Negative for changes in bowel habits.  Endocrine: Negative for cold intolerance, heat intolerance, polydipsia, polyphagia and polyuria.  Genitourinary:  Negative for decreased urine volume, dysuria and hematuria.  Neurological:  Negative for syncope, facial asymmetry and weakness.  Hematological:  Negative for adenopathy. Does not bruise/bleed easily.  Psychiatric/Behavioral:  Negative for confusion. The patient is nervous/anxious.   Rest see pertinent positives and negatives per HPI.  Current Outpatient Medications on File Prior to Visit  Medication Sig Dispense Refill   Calcium Carb-Cholecalciferol  (CALCIUM + D3 PO) Take 1 tablet by mouth daily.      Calcium Carbonate-Vitamin D 600-200 MG-UNIT TABS Take 600 mg by mouth daily.     Cholecalciferol (VITAMIN D3) 2000 UNITS TABS Take 2,000 Units by mouth daily.      hydrochlorothiazide (HYDRODIURIL) 25 MG tablet TAKE 1 TABLET BY MOUTH EVERY DAY 90 tablet 1   lisinopril (ZESTRIL) 10 MG tablet TAKE 1 TABLET BY MOUTH EVERY DAY 90 tablet 1   rosuvastatin (CRESTOR) 5 MG tablet Take 1 tablet (5 mg total) by mouth daily. 90 tablet 1   No current facility-administered medications on file prior to visit.   Past Medical History:  Diagnosis Date   Allergy    Anxiety    no med   Depression    no med   Fibroid    History of migraine    Hx - last migraine 2 yrs ago, no longer a problem   Hodgkin disease ([place]) 1999   lymphocytic predominant Hodkin's 2000, s/p 4 weeks Rituxan, chlorambucil 10/21/99-04/19/00   Hx of iron deficiency anemia    Hx -per [facility] notes, since childhood, possible thalassemia   Hyperglycemia    Hyperlipemia    no med   Hypertension    LAD (lymphadenopathy), axillary    eval with onc and [facility] 2016   Obesity    STD (sexually transmitted disease)    gonorrhea about 30 years ago   SVD (spontaneous vaginal delivery)    x 2   Allergies  Allergen Reactions   Hydrocodone Hives   Oxycodone Hives    Social History   Socioeconomic History   Marital status: Divorced    Spouse name: Not  on file   Number of children: Not on file   Years of education: Not on file   Highest education level: Not on file  Occupational History   Not on file  Tobacco Use   Smoking status: Never   Smokeless tobacco: Never  Vaping Use   Vaping Use: Never used  Substance and Sexual Activity   Alcohol use: No    Alcohol/week: 0.0 standard drinks   Drug use: No   Sexual activity: Yes    Partners: Male    Birth control/protection: Post-menopausal  Other Topics Concern   Not on file  Social History Narrative   Work or School: retail; 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  Physical Exam Vitals and nursing note reviewed.  Constitutional:      General: She is not in acute distress.    Appearance: She is well-developed.  HENT:     Head: Normocephalic and atraumatic.     Mouth/Throat:     Mouth: Mucous membranes are moist.     Pharynx: Oropharynx is clear.  Eyes:     Conjunctiva/sclera: Conjunctivae normal.  Cardiovascular:     Rate and Rhythm: Normal rate and regular rhythm.     Pulses:          Dorsalis pedis pulses are 2+ on the right side and 2+ on the left side.     Heart sounds: No murmur heard. Pulmonary:     Effort: Pulmonary effort is normal. No respiratory distress.     Breath sounds: Normal breath sounds.  Abdominal:     Palpations: Abdomen is soft. There is no mass.     Tenderness: There is no abdominal tenderness.  Lymphadenopathy:     Cervical: No cervical adenopathy.  Skin:    General: Skin is warm.     Findings: No erythema or rash.  Neurological:     General: No focal deficit present.     Mental Status: She is alert and oriented to person, place, and time.     Cranial Nerves: No cranial nerve deficit.     Gait: Gait normal.     Deep Tendon Reflexes:     Reflex Scores:      Bicep reflexes are 2+ on the right side and 2+ on the left side.      Patellar reflexes are 2+ on the right side and 2+ on the left side. Psychiatric:     Comments: Well groomed, good eye contact.   ASSESSMENT AND  PLAN:  [name] was seen today for tingling all over.  Diagnoses and all orders for this visit: Orders Placed This Encounter  Procedures   Basic metabolic panel   CBC   TSH   Vitamin B12   VITAMIN D 25 Hydroxy (Vit-D Deficiency, Fractures)   Lab Results  Component Value Date  [id number] 819 10/27/2021   Lab Results  Component Value Date   TSH 0.83 10/27/2021   Lab Results  Component Value Date   WBC 8.1 10/27/2021   HGB 12.2 10/27/2021   HCT 37.4 10/27/2021   MCV 86.5 10/27/2021   PLT 376.0 10/27/2021   Lab Results  Component Value Date   CREATININE 0.78 10/27/2021   BUN 17 10/27/2021   NA 136 10/27/2021   K 3.7 10/27/2021   CL 98 10/27/2021   CO2 28 10/27/2021   Tingling sensation We discussed possible etiologies. Hx and examination today do not suggest a serious process. For now I think we can wait on head imaging. She will monitor for new symptoms and clearly instructed about warning signs. Further recommendations according to lab results.  Tinnitus of both ears Most likely benign, reassured. Monitor for new symptoms. Instructed about warning signs. F/U in 4 weeks with PCP.  Hyperlipidemia, unspecified hyperlipidemia type We discussed some side effects of stain meds, paresthesia is not among the most common one.Still I recommend holding on Rosuvastatin for 2-3 weeks and see if tingling improves. Continue low fat diet.  Return in about 4 weeks (around 11/24/2021) for PCP.  Nicolas Banh G. Martinique, MD  [facility]. [place] office.

## 2021-10-27 NOTE — Patient Instructions (Signed)
A few things to remember from today's visit:  Tingling sensation - Plan: Basic metabolic panel, CBC, TSH, Vitamin B12, VITAMIN D 25 Hydroxy (Vit-D Deficiency, Fractures)  Hyperlipidemia, unspecified hyperlipidemia type  Paresthesia Paresthesia is an abnormal burning or prickling sensation. It is usually felt in the hands, arms, legs, or feet. However, it may occur in any part of the body. Usually, paresthesia is not painful. It may feel like: Tingling or numbness. Buzzing. Itching. Paresthesia may occur without any clear cause, or it may be caused by: Breathing too quickly (hyperventilation). Pressure on a nerve. An underlying medical condition. Side effects of a medication. Nutritional deficiencies. Exposure to toxic chemicals. Most people experience temporary (transient) paresthesia at some time in their lives. For some people, it may be long-lasting (chronic) because of an underlying medical condition. If you have paresthesia that lasts a long time, you need to be evaluated by your health care provider. Follow these instructions at home: Alcohol use  Do not drink alcohol if: Your health care provider tells you not to drink. You are pregnant, may be pregnant, or are planning to become pregnant. If you drink alcohol: Limit how much you use to: 0-1 drink a day for women. 0-2 drinks a day for men. Be aware of how much alcohol is in your drink. In the [place] one drink equals one 12 oz bottle of beer (355 mL), one 5 oz glass of wine (148 mL), or one 1 oz glass of hard liquor (44 mL). Nutrition  Eat a healthy diet. This includes: Eating foods that are high in fiber, such as fresh fruits and vegetables, whole grains, and beans. Limiting foods that are high in fat and processed sugars, such as fried or sweet foods. General instructions Take over-the-counter and prescription medicines only as told by your health care provider. Do not use any products that contain nicotine or tobacco,  such as cigarettes and e-cigarettes. These can keep blood from reaching damaged nerves. If you need help quitting, ask your health care provider. If you have diabetes, work closely with your health care provider to keep your blood sugar under control. If you have numbness in your feet: Check every day for signs of injury or infection. Watch for redness, warmth, and swelling. Wear padded socks and comfortable shoes. These help protect your feet. Keep all follow-up visits as told by your health care provider. This is important. Contact a health care provider if you: Have paresthesia that gets worse or does not go away. Have numbness after an injury. Have a burning or prickling feeling that gets worse when you walk. Have pain, cramps, or dizziness, or you faint. Develop a rash. Get help right away if you: Feel muscle weakness. Develop new weakness in an arm or leg. Have trouble walking or moving. Have problems with speech, understanding, or vision. Feel confused. Cannot control your bladder or bowel movements. Summary Paresthesia is an abnormal burning or prickling sensation that is usually felt in the hands, arms, legs, or feet. It may also occur in other parts of the body. Paresthesia may occur without any clear cause, or it may be caused by breathing too quickly (hyperventilation), pressure on a nerve, an underlying medical condition, side effects of a medication, nutritional deficiencies, or exposure to toxic chemicals. If you have paresthesia that lasts a long time, you need to be evaluated by your health care provider. This information is not intended to replace advice given to you by your health care provider. Make sure you discuss  any questions you have with your health care provider. Document Revised: 06/16/2020 Document Reviewed: 06/16/2020 Elsevier Patient Education  2022 [place] not use My Chart to request refills or for acute issues that need immediate attention.    Examination today normal. Hold on rosuvastatin for 2-3 weeks and monitor for changes, then resume it. If you determine that medication may be causing tingling, a different medication will be necessary.   Please be sure medication list is accurate. If a new problem present, please set up appointment sooner than planned today.

## 2021-11-04 ENCOUNTER — Telehealth: Payer: Self-pay | Admitting: Family Medicine

## 2021-11-04 NOTE — Telephone Encounter (Signed)
Left voicemail for patient to schedule 4 week follow up for tingling and tinnitus with PCP.

## 2021-11-09 NOTE — Telephone Encounter (Signed)
Left message to call Sharee Pimple, RN at [facility], [phone].  MyChart message to patient.

## 2021-11-12 NOTE — Telephone Encounter (Signed)
Call to patient x2, no response.  MyChart message sent on 11/09/21, has not been read.   Dr. Talbert Nan -please advise.

## 2021-11-20 NOTE — Telephone Encounter (Signed)
Please send the patient a letter explaining the importance of evaluation of PMP bleeding and close the encounter ?

## 2021-11-24 NOTE — Telephone Encounter (Signed)
Letter pended, copy to Dr. Talbert Nan to review.  ?

## 2021-11-25 NOTE — Telephone Encounter (Signed)
Letter reviewed and signed by Dr. Jertson.  Letter mailed to address on file.   Encounter closed. 

## 2021-11-26 ENCOUNTER — Ambulatory Visit: Payer: BC Managed Care – PPO | Admitting: Family Medicine

## 2022-01-24 ENCOUNTER — Ambulatory Visit (INDEPENDENT_AMBULATORY_CARE_PROVIDER_SITE_OTHER): Payer: BC Managed Care – PPO | Admitting: Family Medicine

## 2022-01-24 ENCOUNTER — Encounter: Payer: Self-pay | Admitting: Family Medicine

## 2022-01-24 VITALS — BP 112/82 | HR 85 | Temp 98.1°F | Ht 64.0 in | Wt 211.5 lb

## 2022-01-24 DIAGNOSIS — I10 Essential (primary) hypertension: Secondary | ICD-10-CM | POA: Diagnosis not present

## 2022-01-24 DIAGNOSIS — R739 Hyperglycemia, unspecified: Secondary | ICD-10-CM | POA: Diagnosis not present

## 2022-01-24 DIAGNOSIS — Z0000 Encounter for general adult medical examination without abnormal findings: Secondary | ICD-10-CM | POA: Diagnosis not present

## 2022-01-24 DIAGNOSIS — E785 Hyperlipidemia, unspecified: Secondary | ICD-10-CM | POA: Diagnosis not present

## 2022-01-24 DIAGNOSIS — C8103 Nodular lymphocyte predominant Hodgkin lymphoma, intra-abdominal lymph nodes: Secondary | ICD-10-CM

## 2022-01-24 DIAGNOSIS — B360 Pityriasis versicolor: Secondary | ICD-10-CM

## 2022-01-24 LAB — CBC WITH DIFFERENTIAL/PLATELET
Basophils Absolute: 0 10*3/uL (ref 0.0–0.1)
Basophils Relative: 0.4 % (ref 0.0–3.0)
Eosinophils Absolute: 0.1 10*3/uL (ref 0.0–0.7)
Eosinophils Relative: 1.3 % (ref 0.0–5.0)
HCT: 37.5 % (ref 36.0–46.0)
Hemoglobin: 12.3 g/dL (ref 12.0–15.0)
Lymphocytes Relative: 27.4 % (ref 12.0–46.0)
Lymphs Abs: 2.7 10*3/uL (ref 0.7–4.0)
MCHC: 32.7 g/dL (ref 30.0–36.0)
MCV: 87.7 fl (ref 78.0–100.0)
Monocytes Absolute: 0.6 10*3/uL (ref 0.1–1.0)
Monocytes Relative: 6.2 % (ref 3.0–12.0)
Neutro Abs: 6.3 10*3/uL (ref 1.4–7.7)
Neutrophils Relative %: 64.7 % (ref 43.0–77.0)
Platelets: 381 10*3/uL (ref 150.0–400.0)
RBC: 4.28 Mil/uL (ref 3.87–5.11)
RDW: 14.9 % (ref 11.5–15.5)
WBC: 9.7 10*3/uL (ref 4.0–10.5)

## 2022-01-24 LAB — LIPID PANEL
Cholesterol: 211 mg/dL — ABNORMAL HIGH (ref 0–200)
HDL: 56.2 mg/dL (ref >39.00)
LDL Cholesterol: 138 mg/dL — ABNORMAL HIGH (ref 0–99)
NonHDL: 154.84
Total CHOL/HDL Ratio: 4
Triglycerides: 85 mg/dL (ref 0.0–149.0)
VLDL: 17 mg/dL (ref 0.0–40.0)

## 2022-01-24 LAB — COMPREHENSIVE METABOLIC PANEL
ALT: 17 U/L (ref 0–35)
AST: 19 U/L (ref 0–37)
Albumin: 4.1 g/dL (ref 3.5–5.2)
Alkaline Phosphatase: 70 U/L (ref 39–117)
BUN: 21 mg/dL (ref 6–23)
CO2: 28 mEq/L (ref 19–32)
Calcium: 9.2 mg/dL (ref 8.4–10.5)
Chloride: 101 mEq/L (ref 96–112)
Creatinine, Ser: 0.83 mg/dL (ref 0.40–1.20)
GFR: 77.38 mL/min (ref >60.00)
Glucose, Bld: 131 mg/dL — ABNORMAL HIGH (ref 70–99)
Potassium: 3.5 mEq/L (ref 3.5–5.1)
Sodium: 137 mEq/L (ref 135–145)
Total Bilirubin: 0.3 mg/dL (ref 0.2–1.2)
Total Protein: 7.7 g/dL (ref 6.0–8.3)

## 2022-01-24 LAB — HEMOGLOBIN A1C: Hgb A1c MFr Bld: 6.4 % (ref 4.6–6.5)

## 2022-01-24 MED ORDER — KETOCONAZOLE 2 % EX FOAM: 1.0000 "application " | CUTANEOUS | 2 refills | Status: DC

## 2022-01-24 NOTE — Progress Notes (Signed)
Whitney Santiago ?DOB: [birth year] ?Encounter date: 01/24/2022 ? ?This is a 61 y.o. female who presents for complete physical  ? ?History of present illness/Additional concerns: ? ?She has been having diarrhea; she feels like she has nerves all over. Feels shaky, anxious. Thinks stressful job may be related. She will be in that job until December. Nervousness just started in last couple of days. Although she states that she sleeps well and feels rested, daughter was sleeping in room with her over the weekend and stated that she wasn't sleeping well. Diarrhea started a few days ago. No vomiting. Stools are just once daily, but watery/loose. No blood in the stools. No blood in stools, no abd pain. Previous symptoms 10/27/21 (tingling at that time and ringing in ears) have resolved.  ? ?HTN: Hydrochlorothiazide 25 mg, lisinopril 10 mg. Hasn't been checking at home.  ?Hyperlipidemia: Crestor 5 mg - she stopped taking this due to pain from medication. ?Hyperglycemia: diet controlled ?Last mammogram 10/2021 - normal ?Follows with Dr. Talbert Nan for gyn care; last pap was 07/2020 normal, hpv negative ?Colonoscopy completed 11/2014; benign polyps were noted and repeat recommended in 10 years. ? ?Past Medical History:  ?Diagnosis Date  ? Allergy   ? Anxiety   ? no med  ? Depression   ? no med  ? Fibroid   ? History of migraine   ? Hx - last migraine 2 yrs ago, no longer a problem  ? Hodgkin disease ([place]) 1999  ? lymphocytic predominant Hodkin's 2000, s/p 4 weeks Rituxan, chlorambucil 10/21/99-04/19/00  ? Hx of iron deficiency anemia   ? Hx -per [facility] notes, since childhood, possible thalassemia  ? Hyperglycemia   ? Hyperlipemia   ? no med  ? Hypertension   ? LAD (lymphadenopathy), axillary   ? eval with onc and [facility] 2016  ? Obesity   ? STD (sexually transmitted disease)   ? gonorrhea about 30 years ago  ? SVD (spontaneous vaginal delivery)   ? x 2  ? ?Past Surgical History:  ?Procedure Laterality Date  ? AXILLARY LYMPH NODE BIOPSY Right  06/09/2016  ? Procedure: AXILLARY LYMPH NODE BIOPSY;  Surgeon: Jackolyn Confer, MD;  Location: [facility];  Service: General;  Laterality: Right;  ? BREAST EXCISIONAL BIOPSY Left 2006  ? BREAST SURGERY Left   ? due to bloody discharge  ? COLONOSCOPY    ? DILATATION & CURETTAGE/HYSTEROSCOPY WITH MYOSURE N/A 05/24/2016  ? Procedure: DILATATION & CURETTAGE/HYSTEROSCOPY WITH MYOSURE;  Surgeon: Salvadore Dom, MD;  Location: [facility];  Service: Gynecology;  Laterality: N/A;  ? LAPAROSCOPIC REMOVAL ABDOMINAL MASS  2000  ? LLQ  ? ?Allergies  ?Allergen Reactions  ? Hydrocodone Hives  ? Oxycodone Hives  ? Rosuvastatin   ?  Muscle aches  ? ?Current Meds  ?Medication Sig  ? Calcium Carb-Cholecalciferol (CALCIUM + D3 PO) Take 1 tablet by mouth daily.   ? Calcium Carbonate-Vitamin D 600-200 MG-UNIT TABS Take 600 mg by mouth daily.  ? Cholecalciferol (VITAMIN D3) 2000 UNITS TABS Take 2,000 Units by mouth daily.   ? hydrochlorothiazide (HYDRODIURIL) 25 MG tablet TAKE 1 TABLET BY MOUTH EVERY DAY  ? Ketoconazole 2 % FOAM Apply 1 application. topically 3 (three) times a week.  ? lisinopril (ZESTRIL) 10 MG tablet TAKE 1 TABLET BY MOUTH EVERY DAY  ? [DISCONTINUED] rosuvastatin (CRESTOR) 5 MG tablet Take 1 tablet (5 mg total) by mouth daily.  ? ?Social History  ? ?Tobacco Use  ? Smoking status: Never  ? Smokeless tobacco:  Never  ?Substance Use Topics  ? Alcohol use: No  ?  Alcohol/week: 0.0 standard drinks  ? ?Family History  ?Problem Relation Age of Onset  ? Hypertension Mother   ? Stroke Mother 70  ? Hypertension Father   ? Diabetes Father   ? Hypertension Sister   ? Liver disease Sister   ?     related to infected appendix  ? Diabetes Sister   ? Hypertension Brother   ? Diabetes Brother   ? Hypertension Daughter   ? Hypertension Sister   ? Diabetes Sister   ? Breast cancer Neg Hx   ? ? ? ?Review of Systems  ?Constitutional:  Negative for activity change, appetite change, chills, fatigue, fever and unexpected weight change.  ?HENT:   Negative for congestion, ear pain, hearing loss, sinus pressure, sinus pain, sore throat and trouble swallowing.   ?Eyes:  Negative for pain and visual disturbance.  ?Respiratory:  Negative for cough, chest tightness, shortness of breath and wheezing.   ?Cardiovascular:  Negative for chest pain, palpitations and leg swelling.  ?Gastrointestinal:  Negative for abdominal pain, blood in stool, constipation, diarrhea (has been having daily loose stools), nausea and vomiting.  ?Genitourinary:  Negative for difficulty urinating and menstrual problem.  ?Musculoskeletal:  Negative for arthralgias and back pain.  ?Skin:  Negative for rash.  ?Neurological:  Negative for dizziness, weakness, numbness and headaches.  ?Hematological:  Negative for adenopathy. Does not bruise/bleed easily.  ?Psychiatric/Behavioral:  Negative for sleep disturbance and suicidal ideas. The patient is not nervous/anxious.   ? ?CBC:  ?Lab Results  ?Component Value Date  ? WBC 8.1 10/27/2021  ? HGB 12.2 10/27/2021  ? HGB 11.7 (L) 03/19/2021  ? HGB 11.6 02/27/2017  ? HGB 12.2 06/23/2016  ? HCT 37.4 10/27/2021  ? HCT 33.5 (L) 02/27/2017  ? HCT 37.6 06/23/2016  ? MCH 28.8 03/19/2021  ? MCHC 32.5 10/27/2021  ? RDW 14.9 10/27/2021  ? RDW 15.0 02/27/2017  ? RDW 14.5 06/23/2016  ? PLT 376.0 10/27/2021  ? PLT 362 03/19/2021  ? PLT 382 (H) 02/27/2017  ? MPV 9.1 07/03/2020  ? ?CMP: ?Lab Results  ?Component Value Date  ? NA 136 10/27/2021  ? NA 140 06/23/2016  ? K 3.7 10/27/2021  ? K 3.8 06/23/2016  ? CL 98 10/27/2021  ? CO2 28 10/27/2021  ? CO2 28 06/23/2016  ? ANIONGAP 10 03/19/2021  ? GLUCOSE 141 (H) 10/27/2021  ? GLUCOSE 127 06/23/2016  ? BUN 17 10/27/2021  ? BUN 12.2 06/23/2016  ? CREATININE 0.78 10/27/2021  ? CREATININE 0.79 03/19/2021  ? CREATININE 0.70 07/03/2020  ? CREATININE 0.9 06/23/2016  ? GFRAA >60 06/01/2016  ? CALCIUM 9.9 10/27/2021  ? CALCIUM 10.2 06/23/2016  ? PROT 7.7 07/26/2021  ? PROT 7.3 02/27/2017  ? PROT 7.8 06/23/2016  ? BILITOT 0.6  07/26/2021  ? BILITOT 0.4 03/19/2021  ? BILITOT 0.38 06/23/2016  ? ALKPHOS 73 07/26/2021  ? ALKPHOS 91 06/23/2016  ? ALT 19 07/26/2021  ? ALT 17 03/19/2021  ? ALT 15 06/23/2016  ? AST 21 07/26/2021  ? AST 18 03/19/2021  ? AST 17 06/23/2016  ? ?LIPID: ?Lab Results  ?Component Value Date  ? CHOL 176 07/26/2021  ? TRIG 60.0 07/26/2021  ? HDL 57.10 07/26/2021  ? LDLCALC 107 (H) 07/26/2021  ? LDLCALC 118 (H) 07/03/2020  ? ? ?Objective: ? ?BP 110/88 (BP Location: Left Arm, Patient Position: Sitting, Cuff Size: Large)   Pulse 85   Temp 98.1 ?  F (36.7 ?C) (Oral)   Ht '5\' 4"'$  (1.626 m)   Wt 211 lb 8 oz (95.9 kg)   LMP 10/08/2013   SpO2 98%   BMI 36.30 kg/m?   Weight: 211 lb 8 oz (95.9 kg)  ? ?BP Readings from Last 3 Encounters:  ?01/24/22 110/88  ?10/27/21 140/78  ?07/26/21 102/80  ? ?Wt Readings from Last 3 Encounters:  ?01/24/22 211 lb 8 oz (95.9 kg)  ?10/27/21 212 lb 12.8 oz (96.5 kg)  ?07/26/21 210 lb 6.4 oz (95.4 kg)  ? ? ?Physical Exam ?Constitutional:   ?   General: She is not in acute distress. ?   Appearance: She is well-developed.  ?HENT:  ?   Head: Normocephalic and atraumatic.  ?   Right Ear: External ear normal.  ?   Left Ear: External ear normal.  ?   Mouth/Throat:  ?   Pharynx: No oropharyngeal exudate.  ?Eyes:  ?   Conjunctiva/sclera: Conjunctivae normal.  ?   Pupils: Pupils are equal, round, and reactive to light.  ?Neck:  ?   Thyroid: No thyromegaly.  ?Cardiovascular:  ?   Rate and Rhythm: Normal rate and regular rhythm.  ?   Heart sounds: Normal heart sounds. No murmur heard. ?  No friction rub. No gallop.  ?Pulmonary:  ?   Effort: Pulmonary effort is normal.  ?   Breath sounds: Normal breath sounds.  ?Abdominal:  ?   General: Bowel sounds are normal. There is no distension.  ?   Palpations: Abdomen is soft. There is no mass.  ?   Tenderness: There is no abdominal tenderness. There is no guarding.  ?   Hernia: No hernia is present.  ?Musculoskeletal:     ?   General: No tenderness or deformity. Normal  range of motion.  ?   Cervical back: Normal range of motion and neck supple.  ?Lymphadenopathy:  ?   Cervical: No cervical adenopathy.  ?Skin: ?   General: Skin is warm and dry.  ?   Findings: No rash.  ?Neurological:  ?

## 2022-01-24 NOTE — Patient Instructions (Signed)
Avoid dairy products until you have normal stools. You can always add more fiber to the diet (natural fiber like whole grains/veggies or bulk with metamucil -but I would wait on adding anything to diet). Consider probiotics (non dairy form) like kefir to help give gut a rest.  ?

## 2022-01-26 ENCOUNTER — Telehealth: Payer: Self-pay | Admitting: Family Medicine

## 2022-01-26 MED ORDER — PRAVASTATIN SODIUM 10 MG PO TABS: 10.0000 mg | ORAL_TABLET | Freq: Every day | ORAL | 1 refills | Status: DC

## 2022-01-26 NOTE — Telephone Encounter (Signed)
Pt is calling and would like blood work results. Please call before 4 pm ?

## 2022-01-26 NOTE — Addendum Note (Signed)
Addended by: Agnes Lawrence on: 01/26/2022 03:50 PM ? ? Modules accepted: Orders ? ?

## 2022-01-26 NOTE — Telephone Encounter (Signed)
See results note. 

## 2022-01-31 ENCOUNTER — Other Ambulatory Visit: Payer: Self-pay | Admitting: Family Medicine

## 2022-02-21 NOTE — Progress Notes (Signed)
HPI: [name] is a 61 y.o. female, who is here today to establish care.  Former PCP: Dr Whitney Santiago Last preventive routine visit: 01/24/22.  Chronic medical problems: Hypertension, hyperlipidemia,DM II, and anxiety among some.  Diabetes Mellitus II: Dx'ed in 02/2018 with HgA1C 6.6. - Checking BG at home: Not checking. - Non pharmacologic treatment. - Diet: She does not cook, most of the time she eats frozen vegetables and baked chicken from [employer]. - Exercise: She does not have an exercise routine but she has 2 active jobs. - eye exam: Over a year ago. - foot exam: 07/2021.  - Negative for symptoms of hypoglycemia, polyuria, polydipsia, numbness extremities, foot ulcers/trauma  Lab Results  Component Value Date   HGBA1C 6.4 01/24/2022   Lab Results  Component Value Date   MICROALBUR 1.8 07/26/2021   Hypertension on Lisinopril 10 mg daily and HCTZ 25 mg daily. Negative for unusual or severe headache, visual changes, exertional chest pain, dyspnea,  focal weakness, or edema.  Lab Results  Component Value Date   CREATININE 0.83 01/24/2022   BUN 21 01/24/2022   NA 137 01/24/2022   K 3.5 01/24/2022   CL 101 01/24/2022   CO2 28 01/24/2022   HLD on Pravastatin 10 mg daily, recently started. Rosuvastatin was causing numbness and tingling, resolved after stopping medication.  Lab Results  Component Value Date   CHOL 211 (H) 01/24/2022   HDL 56.20 01/24/2022   LDLCALC 138 (H) 01/24/2022   TRIG 85.0 01/24/2022   CHOLHDL 4 01/24/2022   Medial knee pain, bilateral for at least 6 months. No hx of trauma. Pain with walking stair. No redness or edema. 5/10, intermittent. No treatment.  She also would like her urine check. She has had "tingling" like sensation around vulvar area, "every now and then" for a while. Denies dysuria,increased urinary frequency, gross hematuria,or decreased urine output. Negative for vaginal discharge or bleeding. She sees her gyn  regularly.  Review of Systems  Constitutional:  Negative for activity change, appetite change and fever.  HENT:  Negative for mouth sores, nosebleeds and sore throat.   Respiratory:  Negative for cough and wheezing.   Gastrointestinal:  Negative for abdominal pain, nausea and vomiting.       Negative for changes in bowel habits.  Musculoskeletal:  Positive for arthralgias. Negative for myalgias.  Neurological:  Negative for syncope and facial asymmetry.  Psychiatric/Behavioral:  Negative for confusion. The patient is not nervous/anxious.   Rest see pertinent positives and negatives per HPI.  Current Outpatient Medications on File Prior to Visit  Medication Sig Dispense Refill   Calcium Carb-Cholecalciferol (CALCIUM + D3 PO) Take 1 tablet by mouth daily.      Calcium Carbonate-Vitamin D 600-200 MG-UNIT TABS Take 600 mg by mouth daily.     Cholecalciferol (VITAMIN D3) 2000 UNITS TABS Take 2,000 Units by mouth daily.      hydrochlorothiazide (HYDRODIURIL) 25 MG tablet TAKE 1 TABLET BY MOUTH EVERY DAY 90 tablet 1   Ketoconazole 2 % FOAM Apply 1 application. topically 3 (three) times a week. 100 g 2   lisinopril (ZESTRIL) 10 MG tablet TAKE 1 TABLET BY MOUTH EVERY DAY 90 tablet 1   pravastatin (PRAVACHOL) 10 MG tablet Take 1 tablet (10 mg total) by mouth daily. 90 tablet 1   No current facility-administered medications on file prior to visit.    Past Medical History:  Diagnosis Date   Allergy    Anxiety    no med  Depression    no med   Fibroid    History of migraine    Hx - last migraine 2 yrs ago, no longer a problem   Hodgkin disease ([place]) 1999   lymphocytic predominant Hodkin's 2000, s/p 4 weeks Rituxan, chlorambucil 10/21/99-04/19/00   Hx of iron deficiency anemia    Hx -per [facility] notes, since childhood, possible thalassemia   Hyperglycemia    Hyperlipemia    no med   Hypertension    LAD (lymphadenopathy), axillary    eval with onc and [facility] 2016   Obesity    STD (sexually  transmitted disease)    gonorrhea about 54 years ago   SVD (spontaneous vaginal delivery)    x 2   Allergies  Allergen Reactions   Hydrocodone Hives   Oxycodone Hives   Rosuvastatin     Muscle aches    Family History  Problem Relation Age of Onset   Hypertension Mother    Stroke Mother 34   Hypertension Father    Diabetes Father    Hypertension Sister    Liver disease Sister        related to infected appendix   Diabetes Sister    Hypertension Brother    Diabetes Brother    Hypertension Daughter    Hypertension Sister    Diabetes Sister    Breast cancer Neg Hx     Social History   Socioeconomic History   Marital status: Divorced    Spouse name: Not on file   Number of children: Not on file   Years of education: Not on file   Highest education level: Not on file  Occupational History   Not on file  Tobacco Use   Smoking status: Never   Smokeless tobacco: Never  Vaping Use   Vaping Use: Never used  Substance and Sexual Activity   Alcohol use: No    Alcohol/week: 0.0 standard drinks of alcohol   Drug use: No   Sexual activity: Yes    Partners: Male    Birth control/protection: Post-menopausal  Other Topics Concern   Not on file  Social History Narrative   Work or School: retail; 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  Physical Exam Vitals and nursing note reviewed.  Constitutional:      General: She is not in acute distress.    Appearance: She is well-developed.   HENT:     Head: Normocephalic and atraumatic.     Mouth/Throat:     Mouth: Mucous membranes are moist.     Pharynx: Oropharynx is clear.  Eyes:     Conjunctiva/sclera: Conjunctivae normal.  Cardiovascular:     Rate and Rhythm: Normal rate and regular rhythm.     Pulses:          Dorsalis pedis pulses are 2+ on the right side and 2+ on  the left side.     Heart sounds: No murmur heard. Pulmonary:     Effort: Pulmonary effort is normal. No respiratory distress.     Breath sounds: Normal breath sounds.  Abdominal:     Palpations: Abdomen is soft. There is no hepatomegaly or mass.     Tenderness: There is no abdominal tenderness.  Musculoskeletal:     Right knee: Crepitus present.     Instability Tests: Anterior drawer test negative. Posterior drawer test negative.     Left knee: Crepitus present. Decreased range of motion (Mild limitation of lfexion, it elicits pain).     Instability Tests: Anterior drawer test negative. Posterior drawer test negative.     Comments: Antalgic gait.  Lymphadenopathy:     Cervical: No cervical adenopathy.  Skin:    General: Skin is warm.     Findings: No erythema or rash.  Neurological:     General: No focal deficit present.     Mental Status: She is alert and oriented to person, place, and time.     Cranial Nerves: No cranial nerve deficit.  Psychiatric:     Comments: Well groomed, good eye contact.   ASSESSMENT AND PLAN:  Ms.Sheng was seen today for establish care.  Diagnoses and all orders for this visit:  Vulvar discomfort No urinary symptoms. UA ordered as requested. Recommend arranging appt with gyn if persistent or gets worse.  Osteoarthritis of both knees, unspecified osteoarthritis type We discussed Dx,prognosis,and treatment options. Tylenol 500 mg 3-4 times per day prn. She would like to discuss options with ortho, she may be a good candidate for Hyaluronic acid injections. Wt loss will help.  Type 2 diabetes mellitus  with other specified complication ([place]) [id number] at goal. Continue non pharmacologic treatment. Regular exercise and healthy diet with avoidance of added sugar food intake is an important part of treatment and recommended. Annual eye exam, periodic dental and foot care recommended. F/U in 4 months   Hyperlipemia Continue Pravastatin 10 mg daily and low fat diet. FLP next visit.  Essential hypertension BP adequately controlled. Continue Lisinopril 10 mg daily and HCTZ 25 mg daily. Low salt diet. Due for eye exam.  Return in about 16 weeks (around 06/14/2022).  Whitney Santiago G. [place], MD  [facility]. [facility] office.

## 2022-02-22 ENCOUNTER — Ambulatory Visit: Payer: BC Managed Care – PPO | Admitting: Family Medicine

## 2022-02-22 ENCOUNTER — Encounter: Payer: Self-pay | Admitting: Family Medicine

## 2022-02-22 VITALS — BP 128/80 | HR 79 | Resp 16 | Ht 64.0 in | Wt 212.0 lb

## 2022-02-22 DIAGNOSIS — N949 Unspecified condition associated with female genital organs and menstrual cycle: Secondary | ICD-10-CM

## 2022-02-22 DIAGNOSIS — I10 Essential (primary) hypertension: Secondary | ICD-10-CM

## 2022-02-22 DIAGNOSIS — M170 Bilateral primary osteoarthritis of knee: Secondary | ICD-10-CM | POA: Diagnosis not present

## 2022-02-22 DIAGNOSIS — E1169 Type 2 diabetes mellitus with other specified complication: Secondary | ICD-10-CM | POA: Diagnosis not present

## 2022-02-22 DIAGNOSIS — N94818 Other vulvodynia: Secondary | ICD-10-CM

## 2022-02-22 DIAGNOSIS — E785 Hyperlipidemia, unspecified: Secondary | ICD-10-CM

## 2022-02-22 NOTE — Assessment & Plan Note (Addendum)
Continue Pravastatin 10 mg daily and low fat diet. FLP next visit.

## 2022-02-22 NOTE — Assessment & Plan Note (Addendum)
BP adequately controlled. Continue Lisinopril 10 mg daily and HCTZ 25 mg daily. Low salt diet. Due for eye exam.

## 2022-02-22 NOTE — Assessment & Plan Note (Signed)
HgA1C at goal. Continue non pharmacologic treatment. Regular exercise and healthy diet with avoidance of added sugar food intake is an important part of treatment and recommended. Annual eye exam, periodic dental and foot care recommended. F/U in 4 months

## 2022-02-22 NOTE — Patient Instructions (Addendum)
A few things to remember from today's visit:  Controlled type 2 diabetes mellitus with hyperglycemia, without long-term current use of insulin (HCC)  Perineal discomfort in female - Plan: Urinalysis, Routine w reflex microscopic  Osteoarthritis of both knees, unspecified osteoarthritis type - Plan: Ambulatory referral to Orthopedic Surgery  If you need refills please call your pharmacy. Do not use My Chart to request refills or for acute issues that need immediate attention.  Diabetes Mellitus and Nutrition, Adult When you have diabetes, or diabetes mellitus, it is very important to have healthy eating habits because your blood sugar (glucose) levels are greatly affected by what you eat and drink. Eating healthy foods in the right amounts, at about the same times every day, can help you: Manage your blood glucose. Lower your risk of heart disease. Improve your blood pressure. Reach or maintain a healthy weight. What can affect my meal plan? Every person with diabetes is different, and each person has different needs for a meal plan. Your health care provider may recommend that you work with a dietitian to make a meal plan that is best for you. Your meal plan may vary depending on factors such as: The calories you need. The medicines you take. Your weight. Your blood glucose, blood pressure, and cholesterol levels. Your activity level. Other health conditions you have, such as heart or kidney disease. How do carbohydrates affect me? Carbohydrates, also called carbs, affect your blood glucose level more than any other type of food. Eating carbs raises the amount of glucose in your blood. It is important to know how many carbs you can safely have in each meal. This is different for every person. Your dietitian can help you calculate how many carbs you should have at each meal and for each snack. How does alcohol affect me? Alcohol can cause a decrease in blood glucose (hypoglycemia),  especially if you use insulin or take certain diabetes medicines by mouth. Hypoglycemia can be a life-threatening condition. Symptoms of hypoglycemia, such as sleepiness, dizziness, and confusion, are similar to symptoms of having too much alcohol. Do not drink alcohol if: Your health care provider tells you not to drink. You are pregnant, may be pregnant, or are planning to become pregnant. If you drink alcohol: Limit how much you have to: 0-1 drink a day for women. 0-2 drinks a day for men. Know how much alcohol is in your drink. In the [place] one drink equals one 12 oz bottle of beer (355 mL), one 5 oz glass of wine (148 mL), or one 1 oz glass of hard liquor (44 mL). Keep yourself hydrated with water, diet soda, or unsweetened iced tea. Keep in mind that regular soda, juice, and other mixers may contain a lot of sugar and must be counted as carbs. What are tips for following this plan?  Reading food labels Start by checking the serving size on the Nutrition Facts label of packaged foods and drinks. The number of calories and the amount of carbs, fats, and other nutrients listed on the label are based on one serving of the item. Many items contain more than one serving per package. Check the total grams (g) of carbs in one serving. Check the number of grams of saturated fats and trans fats in one serving. Choose foods that have a low amount or none of these fats. Check the number of milligrams (mg) of salt (sodium) in one serving. Most people should limit total sodium intake to less than 2,300 mg per  day. Always check the nutrition information of foods labeled as "low-fat" or "nonfat." These foods may be higher in added sugar or refined carbs and should be avoided. Talk to your dietitian to identify your daily goals for nutrients listed on the label. Shopping Avoid buying canned, pre-made, or processed foods. These foods tend to be high in fat, sodium, and added sugar. Shop around the outside  edge of the grocery store. This is where you will most often find fresh fruits and vegetables, bulk grains, fresh meats, and fresh dairy products. Cooking Use low-heat cooking methods, such as baking, instead of high-heat cooking methods, such as deep frying. Cook using healthy oils, such as olive, canola, or sunflower oil. Avoid cooking with butter, cream, or high-fat meats. Meal planning Eat meals and snacks regularly, preferably at the same times every day. Avoid going long periods of time without eating. Eat foods that are high in fiber, such as fresh fruits, vegetables, beans, and whole grains. Eat 4-6 oz (112-168 g) of lean protein each day, such as lean meat, chicken, fish, eggs, or tofu. One ounce (oz) (28 g) of lean protein is equal to: 1 oz (28 g) of meat, chicken, or fish. 1 egg.  cup (62 g) of tofu. Eat some foods each day that contain healthy fats, such as avocado, nuts, seeds, and fish. What foods should I eat? Fruits Berries. Apples. Oranges. Peaches. Apricots. Plums. Grapes. Mangoes. Papayas. Pomegranates. Kiwi. Cherries. Vegetables Leafy greens, including lettuce, spinach, kale, chard, collard greens, mustard greens, and cabbage. Beets. Cauliflower. Broccoli. Carrots. Green beans. Tomatoes. Peppers. Onions. Cucumbers. Brussels sprouts. Grains Whole grains, such as whole-wheat or whole-grain bread, crackers, tortillas, cereal, and pasta. Unsweetened oatmeal. Quinoa. Brown or wild rice. Meats and other proteins Seafood. Poultry without skin. Lean cuts of poultry and beef. Tofu. Nuts. Seeds. Dairy Low-fat or fat-free dairy products such as milk, yogurt, and cheese. The items listed above may not be a complete list of foods and beverages you can eat and drink. Contact a dietitian for more information. What foods should I avoid? Fruits Fruits canned with syrup. Vegetables Canned vegetables. Frozen vegetables with butter or cream sauce. Grains Refined white flour and flour  products such as bread, pasta, snack foods, and cereals. Avoid all processed foods. Meats and other proteins Fatty cuts of meat. Poultry with skin. Breaded or fried meats. Processed meat. Avoid saturated fats. Dairy Full-fat yogurt, cheese, or milk. Beverages Sweetened drinks, such as soda or iced tea. The items listed above may not be a complete list of foods and beverages you should avoid. Contact a dietitian for more information. Questions to ask a health care provider Do I need to meet with a certified diabetes care and education specialist? Do I need to meet with a dietitian? What number can I call if I have questions? When are the best times to check my blood glucose? Where to find more information: American Diabetes Association: diabetes.org Academy of Nutrition and Dietetics: eatright.[employer] of Diabetes and Digestive and Kidney Diseases: [employer]is Association of Diabetes Care & Education Specialists: diabeteseducator.org Summary It is important to have healthy eating habits because your blood sugar (glucose) levels are greatly affected by what you eat and drink. It is important to use alcohol carefully. A healthy meal plan will help you manage your blood glucose and lower your risk of heart disease. Your health care provider may recommend that you work with a dietitian to make a meal plan that is best for you. This information  is not intended to replace advice given to you by your health care provider. Make sure you discuss any questions you have with your health care provider. Document Revised: 04/08/2020 Document Reviewed: 04/08/2020 Elsevier Patient Education  [place].   Please be sure medication list is accurate. If a new problem present, please set up appointment sooner than planned today.

## 2022-02-23 LAB — URINALYSIS, ROUTINE W REFLEX MICROSCOPIC
Bilirubin Urine: NEGATIVE
Hgb urine dipstick: NEGATIVE
Ketones, ur: NEGATIVE
Leukocytes,Ua: NEGATIVE
Nitrite: NEGATIVE
RBC / HPF: NONE SEEN (ref 0–?)
Specific Gravity, Urine: <=1.005 — AB (ref 1.000–1.030)
Total Protein, Urine: NEGATIVE
Urine Glucose: NEGATIVE
Urobilinogen, UA: 0.2 (ref 0.0–1.0)
WBC, UA: NONE SEEN (ref 0–?)
pH: 6 (ref 5.0–8.0)

## 2022-02-25 DIAGNOSIS — M25561 Pain in right knee: Secondary | ICD-10-CM | POA: Insufficient documentation

## 2022-02-25 DIAGNOSIS — M25562 Pain in left knee: Secondary | ICD-10-CM | POA: Insufficient documentation

## 2022-03-03 DIAGNOSIS — M170 Bilateral primary osteoarthritis of knee: Secondary | ICD-10-CM | POA: Diagnosis not present

## 2022-03-15 NOTE — Progress Notes (Unsigned)
ACUTE VISIT Chief Complaint  Patient presents with   Hematuria    Since Sunday, when using the bathroom & wiping. Vaginal bleeding.   Constipation   HPI: Ms.Sonja Mishkin is a 61 y.o. female with history of DM 2, hyperlipidemia, and Hodgkin disease in 2004 (completed chemo) here today complaining of blood in toilet after urination as described above. She has also noted blood on underwear. She is positive it is vaginal bleeding. It is intermittent, spotting. LMP at age 14.  She had same problem in the past, evaluated by gyn, Dr Talbert Nan in 08/2020. Had transvaginal and pelvic US 08/2020.  Negative for fever,chills,fatigue,pelvic pain,nausea,vomiting,or urinary symptoms. Last pap smear 07/2020.  2 weeks of constipation, new problem. Daily small and hard bowel movements. Took Malox 3 days ago.  Constipation This is a new problem. The current episode started 1 to 4 weeks ago. The problem is unchanged. Her stool frequency is 1 time per day. The patient is not on a high fiber diet. She Does not exercise regularly. There has Not been adequate water intake. Associated symptoms include abdominal pain and bloating. Pertinent negatives include no back pain, difficulty urinating, fecal incontinence, flatus, hematochezia, hemorrhoids, melena, rectal pain or weight loss. Risk factors include obesity. She has tried laxatives for the symptoms. Her past medical history is significant for endocrine disease.   RUQ abdominal pain, intermittent for years. Alleviated by defecation and passing gas. It has been stable. Abdominal CT done  in 07/2020 showed subtle irregular hepatic capsule, chronic. She denies hx of high alcohol intake.  She denies seeing blood on stool or rectal bleeding. She had colonoscopy in 11/2009, 5 years f/u was recommended.  Wonders if it is caused by knee injections.  Lab Results  Component Value Date   WBC 9.7 01/24/2022   HGB 12.3 01/24/2022   HCT 37.5 01/24/2022    MCV 87.7 01/24/2022   PLT 381.0 01/24/2022   Lab Results  Component Value Date   ALT 17 01/24/2022   AST 19 01/24/2022   ALKPHOS 70 01/24/2022   BILITOT 0.3 01/24/2022   Colonoscopy in 11/2014: 1. Two sessile polyps ranging from 2 to 29m in size were found in the rectum (18m and ascending colon (9m69m polypectomies were performed using snare cautery and with cold forceps 2. The examination was otherwise normal. According to pt, 5 years f/u was recommended.  Abdominal CT was done on 07/28/20 because LLQ abdominal pain and constipation. It showed progression of uterine fibroid compared with 2002.No acute process or evidence of active lymphoma within the abdomen or pelvis. She is planning on arranging appt with oncologist.  Review of Systems  Constitutional:  Negative for weight loss.  HENT:  Negative for mouth sores and nosebleeds.   Gastrointestinal:  Positive for abdominal pain, bloating and constipation. Negative for flatus, hematochezia, hemorrhoids, melena and rectal pain.  Genitourinary:  Negative for decreased urine volume, difficulty urinating, dysuria, hematuria and vaginal discharge.  Musculoskeletal:  Negative for back pain and myalgias.  Neurological:  Negative for syncope and weakness.  Rest see pertinent positives and negatives per HPI.  Current Outpatient Medications on File Prior to Visit  Medication Sig Dispense Refill   Calcium Carb-Cholecalciferol (CALCIUM + D3 PO) Take 1 tablet by mouth daily.      Calcium Carbonate-Vitamin D 600-200 MG-UNIT TABS Take 600 mg by mouth daily.     Cholecalciferol (VITAMIN D3) 2000 UNITS TABS Take 2,000 Units by mouth daily.      hydrochlorothiazide (HYDRODIURIL) 25  MG tablet TAKE 1 TABLET BY MOUTH EVERY DAY 90 tablet 1   Ketoconazole 2 % FOAM Apply 1 application. topically 3 (three) times a week. 100 g 2   lisinopril (ZESTRIL) 10 MG tablet TAKE 1 TABLET BY MOUTH EVERY DAY 90 tablet 1   pravastatin (PRAVACHOL) 10 MG tablet Take 1  tablet (10 mg total) by mouth daily. 90 tablet 1   No current facility-administered medications on file prior to visit.   Past Medical History:  Diagnosis Date   Allergy    Anxiety    no med   Depression    no med   Fibroid    History of migraine    Hx - last migraine 2 yrs ago, no longer a problem   Hodgkin disease ([place]) 1999   lymphocytic predominant Hodkin's 2000, s/p 4 weeks Rituxan, chlorambucil 10/21/99-04/19/00   Hx of iron deficiency anemia    Hx -per [facility] notes, since childhood, possible thalassemia   Hyperglycemia    Hyperlipemia    no med   Hypertension    LAD (lymphadenopathy), axillary    eval with onc and [facility] 2016   Obesity    STD (sexually transmitted disease)    gonorrhea about 30 years ago   SVD (spontaneous vaginal delivery)    x 2   Allergies  Allergen Reactions   Hydrocodone Hives   Oxycodone Hives   Rosuvastatin     Muscle aches   Social History   Socioeconomic History   Marital status: Divorced    Spouse name: Not on file   Number of children: Not on file   Years of education: Not on file   Highest education level: Not on file  Occupational History   Not on file  Tobacco Use   Smoking status: Never   Smokeless tobacco: Never  Vaping Use   Vaping Use: Never used  Substance and Sexual Activity   Alcohol use: No    Alcohol/week: 0.0 standard drinks of alcohol   Drug use: No   Sexual activity: Yes    Partners: Male    Birth control/protection: Post-menopausal  Other Topics Concern   Not on file  Social History Narrative   Work or School: retail; 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      Home Situation: lives with her friend       Spiritual Beliefs: no      Lifestyle: no regular exercise; diet is poor            Social Determinants of Radio broadcast assistant Strain: Not on file  Food Insecurity: No Food Insecurity (08/24/2020)   Hunger Vital Sign    Worried About Running Out of Food in the Last Year: Never  true    Ran Out of Food in the Last Year: Never true  Transportation Needs: Not on file  Physical Activity: Not on file  Stress: Not on file  Social Connections: Not on file   Vitals:   03/16/22 0757  BP: 120/70  Pulse: 85  Resp: 12  SpO2: 99%   Body mass index is 35.4 kg/m.  Physical Exam Vitals and nursing note reviewed.  Constitutional:      General: She is not in acute distress.    Appearance: She is well-developed. She is not ill-appearing.  HENT:     Head: Normocephalic and atraumatic.     Mouth/Throat:     Mouth: Mucous membranes are moist.     Pharynx: Oropharynx is clear.  Eyes:  General: No scleral icterus.    Conjunctiva/sclera: Conjunctivae normal.  Cardiovascular:     Rate and Rhythm: Normal rate and regular rhythm.     Heart sounds: No murmur heard. Pulmonary:     Effort: Pulmonary effort is normal. No respiratory distress.     Breath sounds: Normal breath sounds.  Abdominal:     General: Bowel sounds are normal. There is no distension.     Palpations: Abdomen is soft. There is no hepatomegaly or mass.     Tenderness: There is no abdominal tenderness.  Genitourinary:    Comments: She prefers it deferred to gyn. Lymphadenopathy:     Cervical: No cervical adenopathy.     Upper Body:     Right upper body: No supraclavicular adenopathy.     Left upper body: No supraclavicular adenopathy.  Skin:    General: Skin is warm.     Findings: No erythema or rash.  Neurological:     Mental Status: She is alert and oriented to person, place, and time.  Psychiatric:     Comments: Well groomed, good eye contact.   ASSESSMENT AND PLAN:  Ms.Saidah was seen today for vaginal bleeding and constipation.  Diagnoses and all orders for this visit: Orders Placed This Encounter  Procedures   Ambulatory referral to Gynecology   Constipation, unspecified constipation type She denies seeing blood in stool or melena. Stressed the importance of adequate fluid and  fiber intake. Recommend Miralax daily, if problem is not better she can add Bisacodyl 5 mg daily prn. We can consider Linzess if problem does not improved with above recommendation.  Postmenopausal bleeding She is having mild bleeding, she prefers to skip pelvic examination today. She needs to follow with gyn, may need to have endometrial Bx repeated and follow on fibroids. UA was not ordered today, she is not having urinary symptoms and can be contaminated by bleeding. Instructed about warning signs. Gyn referral placed.  RUQ pain Chronic. Could be related with constipation. Hx and examination do not suggest a serious process. Abdominal CT done in 07/2020 negative for liver masses, stable chronic changes. We will continue following LFT's and may consider RUQ [place] if problem gets worse. Instructed about warning signs.  She is planning on arranging appt with her oncologist, so I do not think labs are needed today.  I spent a total of 32 minutes in both face to face and non face to face activities for this visit on the date of this encounter. During this time history was obtained and documented, examination was performed, prior labs/imaging reviewed, and assessment/plan discussed.  Return in about 5 months (around 08/16/2022).  Camya Haydon G. [place], MD  [facility]. [place] office.

## 2022-03-16 ENCOUNTER — Encounter: Payer: Self-pay | Admitting: Family Medicine

## 2022-03-16 ENCOUNTER — Ambulatory Visit: Payer: BC Managed Care – PPO | Admitting: Family Medicine

## 2022-03-16 VITALS — BP 120/70 | HR 85 | Resp 12 | Ht 64.0 in | Wt 206.2 lb

## 2022-03-16 DIAGNOSIS — R1011 Right upper quadrant pain: Secondary | ICD-10-CM | POA: Diagnosis not present

## 2022-03-16 DIAGNOSIS — K5900 Constipation, unspecified: Secondary | ICD-10-CM

## 2022-03-16 DIAGNOSIS — N950 Postmenopausal bleeding: Secondary | ICD-10-CM

## 2022-03-16 NOTE — Patient Instructions (Signed)
A few things to remember from today's visit:  Postmenopausal bleeding - Plan: Ambulatory referral to Gynecology  Constipation, unspecified constipation type  RUQ pain Do not use My Chart to request refills or for acute issues that need immediate attention.  Miralax daily at night as needed. In 4-7 days of still constipated, add Bisacodyl 5 mg daily as needed. If not better we can consider Linzess prescription. Abdominal pain does not seem worrisome, continue monitoring.  Please be sure medication list is accurate. If a new problem present, please set up appointment sooner than planned today.   Constipation, Adult Constipation is when a person has trouble pooping (having a bowel movement). When you have this condition, you may poop fewer than 3 times a week. Your poop (stool) may also be dry, hard, or bigger than normal. Follow these instructions at home: Eating and drinking  Eat foods that have a lot of fiber, such as: Fresh fruits and vegetables. Whole grains. Beans. Eat less of foods that are low in fiber and high in fat and sugar, such as: [place] fries. Hamburgers. Cookies. Candy. Soda. Drink enough fluid to keep your pee (urine) pale yellow. General instructions Exercise regularly or as told by your doctor. Try to do 150 minutes of exercise each week. Go to the restroom when you feel like you need to poop. Do not hold it in. Take over-the-counter and prescription medicines only as told by your doctor. These include any fiber supplements. When you poop: Do deep breathing while relaxing your lower belly (abdomen). Relax your pelvic floor. The pelvic floor is a group of muscles that support the rectum, bladder, and intestines (as well as the uterus in women). Watch your condition for any changes. Tell your doctor if you notice any. Keep all follow-up visits as told by your doctor. This is important. Contact a doctor if: You have pain that gets worse. You have a  fever. You have not pooped for 4 days. You vomit. You are not hungry. You lose weight. You are bleeding from the opening of the butt (anus). You have thin, pencil-like poop. Get help right away if: You have a fever, and your symptoms suddenly get worse. You leak poop or have blood in your poop. Your belly feels hard or bigger than normal (bloated). You have very bad belly pain. You feel dizzy or you faint. Summary Constipation is when a person poops fewer than 3 times a week, has trouble pooping, or has poop that is dry, hard, or bigger than normal. Eat foods that have a lot of fiber. Drink enough fluid to keep your pee (urine) pale yellow. Take over-the-counter and prescription medicines only as told by your doctor. These include any fiber supplements. This information is not intended to replace advice given to you by your health care provider. Make sure you discuss any questions you have with your health care provider. Document Revised: 07/24/2019 Document Reviewed: 07/24/2019 Elsevier Patient Education  [place].

## 2022-03-17 ENCOUNTER — Encounter: Payer: Self-pay | Admitting: Family Medicine

## 2022-03-17 ENCOUNTER — Telehealth: Payer: Self-pay | Admitting: *Deleted

## 2022-03-17 ENCOUNTER — Telehealth: Payer: Self-pay | Admitting: Oncology

## 2022-03-17 NOTE — Telephone Encounter (Signed)
PC to patient, informed her of appointment on 03/18/22 at 1:00 with Dr. Alen Blew & that lab appointment will be before, she verbalizes understanding.  Scheduling message sent.

## 2022-03-17 NOTE — Telephone Encounter (Signed)
.  Called patient to schedule appointment per 6/29 inbasket, patient is aware of date and time.   

## 2022-03-17 NOTE — Telephone Encounter (Signed)
-----   Message from Wyatt Portela, MD sent at 03/17/2022 11:32 AM EDT ----- Regarding: RE: Patient Request Lab + MD (1:00) on 6/30 ----- Message ----- From: Rolene Course, RN Sent: 03/17/2022  11:21 AM EDT To: Wyatt Portela, MD Subject: RE: Patient Request                            She said she has been having RUQ pain for the last 8-9 days, also says she is having BM's but actually feels constipated.  Her PCP suggested she see you & asked her if she had any liver issues.  She is also seeing her GYN this coming week for prolonged vaginal bleeding.  ----- Message ----- From: Wyatt Portela, MD Sent: 03/17/2022   9:50 AM EDT To: Rolene Course, RN; Lucia Bitter Subject: RE: Patient Request                            Bethena Roys: please call the patient and ask the patient why she needs to be seen. Thanks ----- Message ----- From: Lucia Bitter Sent: 03/17/2022   9:38 AM EDT To: Wyatt Portela, MD Subject: Patient Request                                Patient Requesting Appt, Her primary care provider suggested she return to see you due to some problems she was having. Please advise

## 2022-03-18 ENCOUNTER — Inpatient Hospital Stay: Payer: BC Managed Care – PPO | Attending: Oncology | Admitting: Oncology

## 2022-03-18 ENCOUNTER — Other Ambulatory Visit: Payer: Self-pay

## 2022-03-18 ENCOUNTER — Inpatient Hospital Stay: Payer: BC Managed Care – PPO

## 2022-03-18 VITALS — BP 117/72 | HR 87 | Temp 98.1°F | Resp 16 | Ht 64.0 in | Wt 205.0 lb

## 2022-03-18 DIAGNOSIS — C8103 Nodular lymphocyte predominant Hodgkin lymphoma, intra-abdominal lymph nodes: Secondary | ICD-10-CM | POA: Diagnosis not present

## 2022-03-18 DIAGNOSIS — K5900 Constipation, unspecified: Secondary | ICD-10-CM | POA: Diagnosis not present

## 2022-03-18 DIAGNOSIS — Z8571 Personal history of Hodgkin lymphoma: Secondary | ICD-10-CM | POA: Insufficient documentation

## 2022-03-18 DIAGNOSIS — R109 Unspecified abdominal pain: Secondary | ICD-10-CM | POA: Insufficient documentation

## 2022-03-18 DIAGNOSIS — D649 Anemia, unspecified: Secondary | ICD-10-CM | POA: Diagnosis not present

## 2022-03-18 LAB — CMP (CANCER CENTER ONLY)
ALT: 15 U/L (ref 0–44)
AST: 16 U/L (ref 15–41)
Albumin: 4.1 g/dL (ref 3.5–5.0)
Alkaline Phosphatase: 70 U/L (ref 38–126)
Anion gap: 7 (ref 5–15)
BUN: 12 mg/dL (ref 6–20)
CO2: 30 mmol/L (ref 22–32)
Calcium: 9.5 mg/dL (ref 8.9–10.3)
Chloride: 101 mmol/L (ref 98–111)
Creatinine: 0.79 mg/dL (ref 0.44–1.00)
GFR, Estimated: >60 mL/min (ref >60)
Glucose, Bld: 118 mg/dL — ABNORMAL HIGH (ref 70–99)
Potassium: 3.6 mmol/L (ref 3.5–5.1)
Sodium: 138 mmol/L (ref 135–145)
Total Bilirubin: 0.5 mg/dL (ref 0.3–1.2)
Total Protein: 7.5 g/dL (ref 6.5–8.1)

## 2022-03-18 LAB — CBC WITH DIFFERENTIAL (CANCER CENTER ONLY)
Abs Immature Granulocytes: 0.05 10*3/uL (ref 0.00–0.07)
Basophils Absolute: 0.1 10*3/uL (ref 0.0–0.1)
Basophils Relative: 0 %
Eosinophils Absolute: 0 10*3/uL (ref 0.0–0.5)
Eosinophils Relative: 0 %
HCT: 37.5 % (ref 36.0–46.0)
Hemoglobin: 12.6 g/dL (ref 12.0–15.0)
Immature Granulocytes: 0 %
Lymphocytes Relative: 34 %
Lymphs Abs: 4.1 10*3/uL — ABNORMAL HIGH (ref 0.7–4.0)
MCH: 29 pg (ref 26.0–34.0)
MCHC: 33.6 g/dL (ref 30.0–36.0)
MCV: 86.4 fL (ref 80.0–100.0)
Monocytes Absolute: 0.7 10*3/uL (ref 0.1–1.0)
Monocytes Relative: 6 %
Neutro Abs: 7.2 10*3/uL (ref 1.7–7.7)
Neutrophils Relative %: 60 %
Platelet Count: 393 10*3/uL (ref 150–400)
RBC: 4.34 MIL/uL (ref 3.87–5.11)
RDW: 14 % (ref 11.5–15.5)
WBC Count: 12.2 10*3/uL — ABNORMAL HIGH (ref 4.0–10.5)
nRBC: 0 % (ref 0.0–0.2)

## 2022-03-18 NOTE — Progress Notes (Signed)
Hematology and Oncology Follow Up Visit  Whitney Santiago [id number] 03/13/1961 61 y.o. 03/18/2022 12:25 PM Whitney, Betty Santiago, MDJordan, Malka So, MD   Principle Diagnosis: 61 year old woman with classical Hodgkin's lymphoma diagnosed in 2001.  She had lymphocyte predominant disease and not on any active treatment.   Prior Therapy: She was treated at [facility] with rituximab concomitantly with chlorambucil orally. She appeared to have a good response and remained disease free between 2001 and 2015.  She developed recurrent disease with axillary lymph node involvement with indolent course in 2017 and has not required treatment.  Current therapy: Active surveillance.  Interim History: Ms. Whitney Santiago returns today for repeat evaluation.  Since last visit, she reports feeling well although she does have a few complaints.  She has reported some occasional constipation and epigastric and upper abdominal discomfort at times.  She denies any nausea vomiting or abdominal pain.  She was prescribed MiraLAX although she is only taking it for the last 2 days.  He denies any fevers or chills or sweats.  She denies any adenopathy.  Medications: Reviewed without changes. Current Outpatient Medications  Medication Sig Dispense Refill   Calcium Carb-Cholecalciferol (CALCIUM + D3 PO) Take 1 tablet by mouth daily.      Calcium Carbonate-Vitamin D 600-200 MG-UNIT TABS Take 600 mg by mouth daily.     Cholecalciferol (VITAMIN D3) 2000 UNITS TABS Take 2,000 Units by mouth daily.      hydrochlorothiazide (HYDRODIURIL) 25 MG tablet TAKE 1 TABLET BY MOUTH EVERY DAY 90 tablet 1   Ketoconazole 2 % FOAM Apply 1 application. topically 3 (three) times a week. 100 Santiago 2   lisinopril (ZESTRIL) 10 MG tablet TAKE 1 TABLET BY MOUTH EVERY DAY 90 tablet 1   pravastatin (PRAVACHOL) 10 MG tablet Take 1 tablet (10 mg total) by mouth daily. 90 tablet 1   No current facility-administered medications for this visit.      Allergies:  Allergies  Allergen Reactions   Hydrocodone Hives   Oxycodone Hives   Rosuvastatin     Muscle aches       Physical Exam:     Blood pressure 117/72, pulse 87, temperature 98.1 F (36.7 C), temperature source Temporal, resp. rate 16, height '5\' 4"'$  (1.626 m), weight 205 lb (93 kg), last menstrual period 10/19/1998, SpO2 100 %.   ECOG: 0   General appearance: Comfortable appearing without any discomfort Head: Normocephalic without any trauma Oropharynx: Mucous membranes are moist and pink without any thrush or ulcers. Eyes: Pupils are equal and round reactive to light. Lymph nodes: No cervical, supraclavicular, inguinal or axillary lymphadenopathy.   Heart:regular rate and rhythm.  S1 and S2 without leg edema. Lung: Clear without any rhonchi or wheezes.  No dullness to percussion. Abdomin: Soft, nontender, nondistended with good bowel sounds.  No hepatosplenomegaly. Musculoskeletal: No joint deformity or effusion.  Full range of motion noted. Neurological: No deficits noted on motor, sensory and deep tendon reflex exam. Skin: No petechial rash or dryness.  Appeared moist.     Lab Results: Lab Results  Component Value Date   WBC 9.7 01/24/2022   HGB 12.3 01/24/2022   HCT 37.5 01/24/2022   MCV 87.7 01/24/2022   PLT 381.0 01/24/2022     Chemistry      Component Value Date/Time   NA 137 01/24/2022 0854   NA 140 06/23/2016 0855   K 3.5 01/24/2022 0854   K 3.8 06/23/2016 0855   CL 101 01/24/2022 0854  CO2 28 01/24/2022 0854   CO2 28 06/23/2016 0855   BUN 21 01/24/2022 0854   BUN 12.2 06/23/2016 0855   CREATININE 0.83 01/24/2022 0854   CREATININE 0.79 03/19/2021 1457   CREATININE 0.70 07/03/2020 1122   CREATININE 0.9 06/23/2016 0855      Component Value Date/Time   CALCIUM 9.2 01/24/2022 0854   CALCIUM 10.2 06/23/2016 0855   ALKPHOS 70 01/24/2022 0854   ALKPHOS 91 06/23/2016 0855   AST 19 01/24/2022 0854   AST 18 03/19/2021 1457   AST 17  06/23/2016 0855   ALT 17 01/24/2022 0854   ALT 17 03/19/2021 1457   ALT 15 06/23/2016 0855   BILITOT 0.3 01/24/2022 0854   BILITOT 0.4 03/19/2021 1457   BILITOT 0.38 06/23/2016 0855         Impression and Plan:   61 year old woman with:  1.  Hodgkin's disease diagnosed in 2001.  She was found to have lymphocyte predominant at that time.  She has not had any need for treatment without any evidence of relapse at this time.  The natural course of this disease and treatment choices including repeat radiation versus rituximab were discussed.  I recommended active surveillance at this time.    2.  Anemia: Hemoglobin was within normal range on Jan 24, 2022.  Repeat CBC today was reviewed and showed no evidence to suggest neurological abnormality.  3.   Abdominal pain and discomfort: Unclear etiology likely related to constipation rather than malignancy.  She also has follow-up with gynecology regarding vaginal bleeding.  I will obtain CT scan of the abdomen and pelvis for completeness to rule out lymphoma recurrence.  4. Follow-up: In 1 year for repeat follow-up sooner if abnormalities are detected.  30  minutes were spent on this encounter.  The time was dedicated to reviewing laboratory data, disease status update and outlining future plan of care discussion.  [name], MD 6/30/202312:25 PM

## 2022-03-25 ENCOUNTER — Ambulatory Visit: Payer: BC Managed Care – PPO | Admitting: Obstetrics and Gynecology

## 2022-03-28 ENCOUNTER — Ambulatory Visit (INDEPENDENT_AMBULATORY_CARE_PROVIDER_SITE_OTHER): Payer: BC Managed Care – PPO | Admitting: Obstetrics and Gynecology

## 2022-03-28 ENCOUNTER — Other Ambulatory Visit (HOSPITAL_COMMUNITY)
Admission: RE | Admit: 2022-03-28 | Discharge: 2022-03-28 | Disposition: A | Discharge status: Home / Self Care | Payer: BC Managed Care – PPO | Source: Ambulatory Visit | Attending: Obstetrics and Gynecology | Admitting: Obstetrics and Gynecology

## 2022-03-28 ENCOUNTER — Encounter: Payer: Self-pay | Admitting: Obstetrics and Gynecology

## 2022-03-28 VITALS — BP 136/80 | HR 72 | Ht 63.0 in | Wt 209.0 lb

## 2022-03-28 DIAGNOSIS — N950 Postmenopausal bleeding: Secondary | ICD-10-CM | POA: Diagnosis not present

## 2022-03-28 NOTE — Progress Notes (Signed)
GYNECOLOGY  VISIT   HPI: 61 y.o.   Divorced Black or [place] American Not Hispanic or Latino  female   [phone] with Patient's last menstrual period was 10/19/1998 (approximate).   here for PMB. She had bleeding for 8 days, mostly spotting to light bleeding with small clots. She states that she had blood clots on the pad last week. She is not bleeding today.  No pain  She was evaluated for PMP bleeding in 12/21, her endometrial stripe was thickened, endometrial biopsy returned with inactive endometrium. It was recommended that she return for a sonohysterogram or hysteroscopy. The patient was didn't return for f/u.  She has known fibroids, at her ultrasound in 12/21 her uterus measured 13 x 10 x 11 cm. 3 fibroids were measured, the largest was 8 cm.   GYNECOLOGIC HISTORY: Patient's last menstrual period was 10/19/1998 (approximate). Contraception:PMP Menopausal hormone therapy: no        OB History     Gravida  2   Para  2   Term  2   Preterm      AB      Living  2      SAB      IAB      Ectopic      Multiple      Live Births  2              Patient Active Problem List   Diagnosis Date Noted   Type 2 diabetes mellitus with other specified complication ([place]) 14/78/2956   Hyperglycemia 06/19/2015   Essential hypertension 01/09/2015   Hyperlipemia 01/09/2015   Hodgkin disease, Hx of 2000, treated at [facility] 06/26/2014    Past Medical History:  Diagnosis Date   Allergy    Anxiety    no med   Depression    no med   Fibroid    History of migraine    Hx - last migraine 2 yrs ago, no longer a problem   Hodgkin disease ([place]) 1999   lymphocytic predominant Hodkin's 2000, s/p 4 weeks Rituxan, chlorambucil 10/21/99-04/19/00   Hx of iron deficiency anemia    Hx -per [facility] notes, since childhood, possible thalassemia   Hyperglycemia    Hyperlipemia    no med   Hypertension    LAD (lymphadenopathy), axillary    eval with onc and [facility] 2016   Obesity    STD (sexually  transmitted disease)    gonorrhea about 30 years ago   SVD (spontaneous vaginal delivery)    x 2    Past Surgical History:  Procedure Laterality Date   AXILLARY LYMPH NODE BIOPSY Right 06/09/2016   Procedure: AXILLARY LYMPH NODE BIOPSY;  Surgeon: Jackolyn Confer, MD;  Location: [place];  Service: General;  Laterality: Right;   BREAST EXCISIONAL BIOPSY Left 2006   BREAST SURGERY Left    due to bloody discharge   COLONOSCOPY     [place] N/A 05/24/2016   Procedure: [place];  Surgeon: Salvadore Dom, MD;  Location: [facility];  Service: Gynecology;  Laterality: N/A;   LAPAROSCOPIC REMOVAL ABDOMINAL MASS  2000   LLQ    Current Outpatient Medications  Medication Sig Dispense Refill   Calcium Carb-Cholecalciferol (CALCIUM + D3 PO) Take 1 tablet by mouth daily.      Calcium Carbonate-Vitamin D 600-200 MG-UNIT TABS Take 600 mg by mouth daily.     Cholecalciferol (VITAMIN D3) 2000 UNITS TABS Take 2,000 Units by mouth  daily.      hydrochlorothiazide (HYDRODIURIL) 25 MG tablet TAKE 1 TABLET BY MOUTH EVERY DAY 90 tablet 1   Ketoconazole 2 % FOAM Apply 1 application. topically 3 (three) times a week. 100 g 2   lisinopril (ZESTRIL) 10 MG tablet TAKE 1 TABLET BY MOUTH EVERY DAY 90 tablet 1   pravastatin (PRAVACHOL) 10 MG tablet Take 1 tablet (10 mg total) by mouth daily. 90 tablet 1   No current facility-administered medications for this visit.     ALLERGIES: Hydrocodone, Oxycodone, and Rosuvastatin  Family History  Problem Relation Age of Onset   Hypertension Mother    Stroke Mother 92   Hypertension Father    Diabetes Father    Hypertension Sister    Liver disease Sister        related to infected appendix   Diabetes Sister    Hypertension Brother    Diabetes Brother    Hypertension Daughter    Hypertension Sister    Diabetes Sister    Breast cancer Neg Hx     Social History   Socioeconomic History    Marital status: Divorced    Spouse name: Not on file   Number of children: Not on file   Years of education: Not on file   Highest education level: Not on file  Occupational History   Not on file  Tobacco Use   Smoking status: Never   Smokeless tobacco: Never  Vaping Use   Vaping Use: Never used  Substance and Sexual Activity   Alcohol use: No    Alcohol/week: 0.0 standard drinks of alcohol   Drug use: No   Sexual activity: Yes    Partners: Male    Birth control/protection: Post-menopausal  Other Topics Concern   Not on file  Social History Narrative   Work or School: retail; 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      Home Situation: lives with her friend       Spiritual Beliefs: no      Lifestyle: no regular exercise; diet is poor            Social Determinants of Radio broadcast assistant Strain: Not on file  Food Insecurity: No Food Insecurity (08/24/2020)   Hunger Vital Sign    Worried About Running Out of Food in the Last Year: Never true    Ran Out of Food in the Last Year: Never true  Transportation Needs: Not on file  Physical Activity: Not on file  Stress: Not on file  Social Connections: Not on file  Intimate Partner Violence: Not on file    Review of Systems  All other systems reviewed and are negative.   PHYSICAL EXAMINATION:    BP 136/80   Pulse 72   Ht '5\' 3"'$  (1.6 m)   Wt 209 lb (94.8 kg)   LMP 10/19/1998 (Approximate)   SpO2 100%   BMI 37.02 kg/m     General appearance: alert, cooperative and appears stated age  Abdomen: soft, non-tender; non distended, no masses,  no organomegaly  Pelvic: External genitalia:  no lesions              Urethra:  normal appearing urethra with no masses, tenderness or lesions              Bartholins and Skenes: normal                 Vagina: normal appearing vagina with normal color and discharge, no lesions  Cervix: no lesions              Bimanual Exam:  Uterus:    retroverted, enlarged, 12 week sized, not tender              Adnexa: no mass, fullness, tenderness  The risks of endometrial biopsy were reviewed and a consent was obtained.  A speculum was placed in the vagina and the cervix was cleansed with betadine. A tenaculum was placed on the cervix and the pipelle was placed into the endometrial cavity. The uterus sounded to 11 cm. The endometrial biopsy was performed, taking care to get a representative sample, sampling 360 degrees of the uterine cavity. Moderate tissue was obtained. The tenaculum and speculum were removed. There were no complications.                Chaperone was present for exam.  1. Postmenopausal bleeding - Surgical pathology( [place]/ [facility]) - [place] Sonohysterogram; Future

## 2022-03-28 NOTE — Patient Instructions (Signed)

## 2022-03-30 ENCOUNTER — Other Ambulatory Visit: Payer: Self-pay

## 2022-03-30 DIAGNOSIS — N950 Postmenopausal bleeding: Secondary | ICD-10-CM

## 2022-03-30 LAB — SURGICAL PATHOLOGY

## 2022-04-01 ENCOUNTER — Ambulatory Visit (HOSPITAL_COMMUNITY)
Admission: RE | Admit: 2022-04-01 | Discharge: 2022-04-01 | Disposition: A | Discharge status: Home / Self Care | Payer: BC Managed Care – PPO | Source: Ambulatory Visit | Attending: Oncology | Admitting: Oncology

## 2022-04-01 DIAGNOSIS — N858 Other specified noninflammatory disorders of uterus: Secondary | ICD-10-CM | POA: Diagnosis not present

## 2022-04-01 DIAGNOSIS — C8103 Nodular lymphocyte predominant Hodgkin lymphoma, intra-abdominal lymph nodes: Secondary | ICD-10-CM

## 2022-04-01 DIAGNOSIS — Z8571 Personal history of Hodgkin lymphoma: Secondary | ICD-10-CM | POA: Diagnosis not present

## 2022-04-01 DIAGNOSIS — D259 Leiomyoma of uterus, unspecified: Secondary | ICD-10-CM | POA: Diagnosis not present

## 2022-04-01 DIAGNOSIS — N83202 Unspecified ovarian cyst, left side: Secondary | ICD-10-CM | POA: Diagnosis not present

## 2022-04-01 MED ORDER — IOHEXOL 300 MG/ML  SOLN
100.0000 mL | Freq: Once | INTRAMUSCULAR | Status: AC | PRN
  Administered 2022-04-01: 100 mL via INTRAVENOUS

## 2022-04-12 ENCOUNTER — Telehealth: Payer: Self-pay | Admitting: *Deleted

## 2022-04-12 NOTE — Telephone Encounter (Signed)
Patient called in triage voicemail scheduled for Sonohysterogram on 05/05/22.  She had ct scan abdomen and pelvis she asked does she still need to have Sonohysterogram. I called patient and left messge for her to call to relay she does need to keep schedule appointment.

## 2022-04-14 NOTE — Telephone Encounter (Signed)
The CT noted an ovarian cyst and ultrasound was recommended. The CT doesn't evaluate the lining of the uterus as the U/S does. So, yes she needs the sonohysterogram/u/s

## 2022-04-14 NOTE — Telephone Encounter (Signed)
Dr.Jertson please see below. I just want to confirm before I speak with patient, incase she ask me to check with you. Please advise

## 2022-04-15 NOTE — Telephone Encounter (Signed)
Patient informed. 

## 2022-05-05 ENCOUNTER — Ambulatory Visit (INDEPENDENT_AMBULATORY_CARE_PROVIDER_SITE_OTHER): Payer: BC Managed Care – PPO

## 2022-05-05 ENCOUNTER — Ambulatory Visit (INDEPENDENT_AMBULATORY_CARE_PROVIDER_SITE_OTHER): Payer: BC Managed Care – PPO | Admitting: Obstetrics and Gynecology

## 2022-05-05 ENCOUNTER — Other Ambulatory Visit: Payer: Self-pay | Admitting: Obstetrics and Gynecology

## 2022-05-05 ENCOUNTER — Encounter: Payer: Self-pay | Admitting: Obstetrics and Gynecology

## 2022-05-05 VITALS — BP 150/82 | HR 88 | Wt 205.0 lb

## 2022-05-05 DIAGNOSIS — N950 Postmenopausal bleeding: Secondary | ICD-10-CM

## 2022-05-05 DIAGNOSIS — D259 Leiomyoma of uterus, unspecified: Secondary | ICD-10-CM | POA: Diagnosis not present

## 2022-05-05 DIAGNOSIS — R9389 Abnormal findings on diagnostic imaging of other specified body structures: Secondary | ICD-10-CM | POA: Diagnosis not present

## 2022-05-05 DIAGNOSIS — N8502 Endometrial intraepithelial neoplasia [EIN]: Secondary | ICD-10-CM | POA: Diagnosis not present

## 2022-05-05 DIAGNOSIS — I10 Essential (primary) hypertension: Secondary | ICD-10-CM | POA: Diagnosis not present

## 2022-05-05 DIAGNOSIS — N882 Stricture and stenosis of cervix uteri: Secondary | ICD-10-CM

## 2022-05-05 DIAGNOSIS — N9489 Other specified conditions associated with female genital organs and menstrual cycle: Secondary | ICD-10-CM

## 2022-05-05 DIAGNOSIS — R7303 Prediabetes: Secondary | ICD-10-CM

## 2022-05-05 DIAGNOSIS — N7011 Chronic salpingitis: Secondary | ICD-10-CM | POA: Diagnosis not present

## 2022-05-05 MED ORDER — MISOPROSTOL 200 MCG PO TABS: ORAL_TABLET | ORAL | 0 refills | Status: DC

## 2022-05-05 NOTE — Progress Notes (Addendum)
GYNECOLOGY  VISIT   HPI: 61 y.o.   Divorced Black or [place] American Not Hispanic or Latino  female   [phone] with Whitney Santiago's last menstrual period was 10/19/1998 (approximate).   here for further evaluation of PMP bleeding  03/28/22  A. ENDOMETRIUM, BIOPSY:  Benign endometrium with mixed hormone effect  Negative for breakdown, polyp, atypia, hyperplasia and carcinoma  07/16/20 pap: negative, negative hpv  01/24/22 HgbA1C 6.4%  03/08/22: CMP normal other than a glucose of 118.  GYNECOLOGIC HISTORY: Whitney Santiago's last menstrual period was 10/19/1998 (approximate). Contraception:PMP Menopausal hormone therapy: no        OB History     Gravida  2   Para  2   Term  2   Preterm      AB      Living  2      SAB      IAB      Ectopic      Multiple      Live Births  2              Whitney Santiago Active Problem List   Diagnosis Date Noted   Type 2 diabetes mellitus with other specified complication ([place]) 09/62/8366   Hyperglycemia 06/19/2015   Essential hypertension 01/09/2015   Hyperlipemia 01/09/2015   Hodgkin disease, Hx of 2000, treated at [facility] 06/26/2014    Past Medical History:  Diagnosis Date   Allergy    Anxiety    no med   Depression    no med   Fibroid    History of migraine    Hx - last migraine 2 yrs ago, no longer a problem   Hodgkin disease ([place]) 1999   lymphocytic predominant Hodkin's 2000, s/p 4 weeks Rituxan, chlorambucil 10/21/99-04/19/00   Hx of iron deficiency anemia    Hx -per [facility] notes, since childhood, possible thalassemia   Hyperglycemia    Hyperlipemia    no med   Hypertension    LAD (lymphadenopathy), axillary    eval with onc and [facility] 2016   Obesity    STD (sexually transmitted disease)    gonorrhea about 30 years ago   SVD (spontaneous vaginal delivery)    x 2    Past Surgical History:  Procedure Laterality Date   AXILLARY LYMPH NODE BIOPSY Right 06/09/2016   Procedure: AXILLARY LYMPH NODE BIOPSY;  Surgeon: Jackolyn Confer,  MD;  Location: [place];  Service: General;  Laterality: Right;   BREAST EXCISIONAL BIOPSY Left 2006   BREAST SURGERY Left    due to bloody discharge   COLONOSCOPY     [place] N/A 05/24/2016   Procedure: [place];  Surgeon: Salvadore Dom, MD;  Location: [facility];  Service: Gynecology;  Laterality: N/A;   LAPAROSCOPIC REMOVAL ABDOMINAL MASS  2000   LLQ    Current Outpatient Medications  Medication Sig Dispense Refill   Calcium Carb-Cholecalciferol (CALCIUM + D3 PO) Take 1 tablet by mouth daily.      Calcium Carbonate-Vitamin D 600-200 MG-UNIT TABS Take 600 mg by mouth daily.     Cholecalciferol (VITAMIN D3) 2000 UNITS TABS Take 2,000 Units by mouth daily.      hydrochlorothiazide (HYDRODIURIL) 25 MG tablet TAKE 1 TABLET BY MOUTH EVERY DAY 90 tablet 1   Ketoconazole 2 % FOAM Apply 1 application. topically 3 (three) times a week. 100 g 2   lisinopril (ZESTRIL) 10 MG tablet TAKE 1 TABLET BY MOUTH EVERY DAY 90 tablet  1   misoprostol (CYTOTEC) 200 MCG tablet Place 2 tablets vaginally the night prior to surgery 2 tablet 0   pravastatin (PRAVACHOL) 10 MG tablet Take 1 tablet (10 mg total) by mouth daily. 90 tablet 1   No current facility-administered medications for this visit.     ALLERGIES: Hydrocodone, Oxycodone, and Rosuvastatin  Family History  Problem Relation Age of Onset   Hypertension Mother    Stroke Mother 58   Hypertension Father    Diabetes Father    Hypertension Sister    Liver disease Sister        related to infected appendix   Diabetes Sister    Hypertension Brother    Diabetes Brother    Hypertension Daughter    Hypertension Sister    Diabetes Sister    Breast cancer Neg Hx     Social History   Socioeconomic History   Marital status: Divorced    Spouse name: Not on file   Number of children: Not on file   Years of education: Not on file   Highest education level: Not on file   Occupational History   Not on file  Tobacco Use   Smoking status: Never   Smokeless tobacco: Never  Vaping Use   Vaping Use: Never used  Substance and Sexual Activity   Alcohol use: No    Alcohol/week: 0.0 standard drinks of alcohol   Drug use: No   Sexual activity: Yes    Partners: Male    Birth control/protection: Post-menopausal  Other Topics Concern   Not on file  Social History Narrative   Work or School: retail; 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      Home Situation: lives with her friend       Spiritual Beliefs: no      Lifestyle: no regular exercise; diet is poor            Social Determinants of Radio broadcast assistant Strain: Not on file  Food Insecurity: No Food Insecurity (08/24/2020)   Hunger Vital Sign    Worried About Running Out of Food in the Last Year: Never true    Ran Out of Food in the Last Year: Never true  Transportation Needs: Not on file  Physical Activity: Not on file  Stress: Not on file  Social Connections: Not on file  Intimate Partner Violence: Not on file    ROS  Ultrasound: see report Enlarged, retroverted uterus with multiple fibroids. Largest fibroid is 8 cm. Endometrial stripe of 7.47 mm with suspected endometrial polyp Large, avascular tubular structure in the left adnexa , suspicious for a left hydrosalpinx, 6.4 x 2.8 x 4.6 cm  PHYSICAL EXAMINATION:    BP (!) 150/82   Pulse 88   Wt 205 lb (93 kg)   LMP 10/19/1998 (Approximate)   SpO2 100%   BMI 36.31 kg/m     General appearance: alert, cooperative and appears stated age Neck: no adenopathy, supple, symmetrical, trachea midline and thyroid normal to inspection and palpation Heart: regular rate and rhythm Lungs: CTAB Abdomen: soft, non-tender; bowel sounds normal; no masses,  no organomegaly Extremities: normal, atraumatic, no cyanosis Skin: normal color, texture and turgor, no rashes or lesions Lymph: normal cervical supraclavicular and  inguinal nodes Neurologic: grossly normal  1. Postmenopausal bleeding Benign endometrial biopsy, thickened endometrium. Discussed the option of sonohysterogram or hysteroscopy. She would prefer to proceed with hysteroscopy. Plan: hysteroscopy, dilation and curettage. Reviewed risks, including: bleeding, infection, uterine perforation, fluid overload,  need for further surgery  2. Thickened endometrium  3. Cervical stenosis (uterine cervix) - misoprostol (CYTOTEC) 200 MCG tablet; Place 2 tablets vaginally the night prior to surgery  Dispense: 2 tablet; Refill: 0  4. Essential hypertension On medication, controlled. Slightly elevated today but she was anxious about a possible sonohysterogram  5. Prediabetes Recent HgbA1C was 6.4%  6. Adnexal mass Suspicious for hydrosalpinx, it has grown in size since CT in 7/23 (4.9 cm then, 6.4 cm now) - CA 125 - SURESWAB CT/NG/T. Vaginalis  Will not schedule surgery until her CA 125 and cervical culture have returned  Addendum: CA 125 is normal, negative cervical cultures.  I reviewed the Whitney Santiago with Dr Berline Lopes in GYN Oncology. She felt either f/u with MRI or laparoscopy at the time of hysteroscopy was reasonable.  I spoke with the Whitney Santiago and reviewed the options. She would like to proceed with laparoscopy, we discussed BSO, she agrees. Risks reviewed, including but not limited to: bleeding, infection, damage to nearby organs, and need for further surgery.  Recovery reviewed.

## 2022-05-06 LAB — SURESWAB CT/NG/T. VAGINALIS
C. trachomatis RNA, TMA: NOT DETECTED
N. gonorrhoeae RNA, TMA: NOT DETECTED
Trichomonas vaginalis RNA: NOT DETECTED

## 2022-05-06 LAB — CA 125: CA 125: 10 U/mL (ref <35)

## 2022-05-09 ENCOUNTER — Telehealth: Payer: Self-pay | Admitting: Obstetrics and Gynecology

## 2022-05-09 NOTE — Telephone Encounter (Signed)
Surgery: CPT [phone] - Hysteroscopy/D&C/Myosure, CPT [phone] - Laparoscopic Bilateral Salpingectomy and/or Oophorectomy BSO  Diagnosis: N95.0 Postmenopausal Bleeding, R93.89 Thickened Endometrium,  left adnexal mass  Location: [facility]  Status: Outpatient  Time: 16 Minutes  Assistant: Josefa Half, MD, Cherlynn June  Urgency: First Available  Pre-Op Appointment: Completed  Post-Op Appointment(s): 2 Weeks  Time Out Of Work: 2 [place]

## 2022-05-10 NOTE — Telephone Encounter (Signed)
Left message to call Caelan Atchley, RN at [facility], [phone].  

## 2022-05-11 NOTE — Telephone Encounter (Signed)
Spoke with patient. Reviewed surgery dates. Patient request to proceed with surgery on 06/06/22.  Advised patient I will forward to business office for return call. I will return call once surgery date and time confirmed. Patient verbalizes understanding and is agreeable.   Surgery request sent.

## 2022-05-12 NOTE — Telephone Encounter (Signed)
Spoke with patient. Surgery date request confirmed.  Advised surgery is scheduled for 06/07/22, [place] at 0730. Surgery instruction sheet and hospital brochure reviewed, printed copy will be mailed.  Patient verbalizes understanding and is agreeable.  Routing to [employer] for benefits

## 2022-05-12 NOTE — Telephone Encounter (Signed)
Spoke with patient regarding surgery benefits. Patient acknowledges understanding of information presented. Patient is aware that benefits presented are professional benefits only. Patient is aware the hospital will call with facility benefits. See account note.Front desk aware of PR

## 2022-05-24 ENCOUNTER — Encounter: Payer: Self-pay | Admitting: Obstetrics and Gynecology

## 2022-05-24 ENCOUNTER — Ambulatory Visit (INDEPENDENT_AMBULATORY_CARE_PROVIDER_SITE_OTHER): Payer: BC Managed Care – PPO | Admitting: Obstetrics and Gynecology

## 2022-05-24 ENCOUNTER — Other Ambulatory Visit: Payer: Self-pay

## 2022-05-24 ENCOUNTER — Encounter (HOSPITAL_BASED_OUTPATIENT_CLINIC_OR_DEPARTMENT_OTHER): Payer: Self-pay | Admitting: Obstetrics and Gynecology

## 2022-05-24 VITALS — BP 128/80 | HR 76 | Ht 63.0 in | Wt 206.0 lb

## 2022-05-24 DIAGNOSIS — R9389 Abnormal findings on diagnostic imaging of other specified body structures: Secondary | ICD-10-CM | POA: Diagnosis not present

## 2022-05-24 DIAGNOSIS — N950 Postmenopausal bleeding: Secondary | ICD-10-CM

## 2022-05-24 DIAGNOSIS — N882 Stricture and stenosis of cervix uteri: Secondary | ICD-10-CM | POA: Diagnosis not present

## 2022-05-24 DIAGNOSIS — N9489 Other specified conditions associated with female genital organs and menstrual cycle: Secondary | ICD-10-CM

## 2022-05-24 DIAGNOSIS — I10 Essential (primary) hypertension: Secondary | ICD-10-CM

## 2022-05-24 DIAGNOSIS — R7303 Prediabetes: Secondary | ICD-10-CM

## 2022-05-24 NOTE — Progress Notes (Signed)
Spoke w/ via phone for pre-op interview---pt Lab needs dos----I-stat, EKG               Lab results------n/a COVID test -----patient states asymptomatic no test needed Arrive at -------0530 NPO after MN NO Solid Food.  Clear liquids from MN until---0430 Med rec completed Medications to take morning of surgery -----none Diabetic medication -----n/a Patient instructed no nail polish to be worn day of surgery Patient instructed to bring photo id and insurance card day of surgery Patient aware to have Driver (ride ) / caregiver  daughter Irelyn Perfecto  for 24 hours after surgery  Patient Special Instructions -----Cytotec insert vaginal night before surgery.  Pre-Op special Istructions ----- Patient verbalized understanding of instructions that were given at this phone interview. Patient denies shortness of breath, chest pain, fever, cough at this phone interview.

## 2022-05-24 NOTE — Progress Notes (Unsigned)
GYNECOLOGY  VISIT   HPI: 61 y.o.   Divorced Black or [place] American Not Hispanic or Latino  female   [phone] with Patient's last menstrual period was 10/19/1998 (approximate).   here for pre op visit.   The patient presented with PMP bleeding and had a thickened endometrium with suspected polyp on ultrasound. We discussed the option of sonohysterogram and hysteroscopy and she desires hysteroscopy. She was also noted to have a 6.4 cm, avascular tubular structure in the left adnexa. This structure had grown from 4.9 cm when it was noted on CT in 7/23. CA 125 was normal, negative cervical cultures. Patient reviewed with Dr Berline Lopes in GYN Oncology who felt either f/u with MRI or laparoscopy was reasonable. The patient desires laparoscopy, we discussed BSO and she is in agreement.   GYNECOLOGIC HISTORY: Patient's last menstrual period was 10/19/1998 (approximate). Contraception:post menopausal  Menopausal hormone therapy: none         OB History     Gravida  2   Para  2   Term  2   Preterm      AB      Living  2      SAB      IAB      Ectopic      Multiple      Live Births  2              Patient Active Problem List   Diagnosis Date Noted   Type 2 diabetes mellitus with other specified complication ([place]) 40/34/7425   Hyperglycemia 06/19/2015   Essential hypertension 01/09/2015   Hyperlipemia 01/09/2015   Hodgkin disease, Hx of 2000, treated at [facility] 06/26/2014    Past Medical History:  Diagnosis Date   Allergy    Anxiety    no med   Depression    no med   Fibroid    History of migraine    Hx - last migraine 2 yrs ago, no longer a problem   Hodgkin disease ([place]) 1999   lymphocytic predominant Hodkin's 2000, s/p 4 weeks Rituxan, chlorambucil 10/21/99-04/19/00   Hx of iron deficiency anemia    Hx -per [facility] notes, since childhood, possible thalassemia   Hyperglycemia    Hyperlipemia    no med   Hypertension    LAD (lymphadenopathy), axillary    eval with onc  and gsu 2016   Obesity    STD (sexually transmitted disease)    gonorrhea about 30 years ago   SVD (spontaneous vaginal delivery)    x 2    Past Surgical History:  Procedure Laterality Date   AXILLARY LYMPH NODE BIOPSY Right 06/09/2016   Procedure: AXILLARY LYMPH NODE BIOPSY;  Surgeon: Jackolyn Confer, MD;  Location: [place];  Service: General;  Laterality: Right;   BREAST EXCISIONAL BIOPSY Left 2006   BREAST SURGERY Left    due to bloody discharge   COLONOSCOPY     [place] N/A 05/24/2016   Procedure: [place];  Surgeon: Salvadore Dom, MD;  Location: [facility];  Service: Gynecology;  Laterality: N/A;   LAPAROSCOPIC REMOVAL ABDOMINAL MASS  2000   LLQ    Current Outpatient Medications  Medication Sig Dispense Refill   Calcium Carb-Cholecalciferol (CALCIUM + D3 PO) Take 1 tablet by mouth daily.      Calcium Carbonate-Vitamin D 600-200 MG-UNIT TABS Take 600 mg by mouth daily.     Cholecalciferol (VITAMIN D3) 2000 UNITS TABS Take 2,000  Units by mouth daily.      hydrochlorothiazide (HYDRODIURIL) 25 MG tablet TAKE 1 TABLET BY MOUTH EVERY DAY 90 tablet 1   Ketoconazole 2 % FOAM Apply 1 application. topically 3 (three) times a week. 100 g 2   lisinopril (ZESTRIL) 10 MG tablet TAKE 1 TABLET BY MOUTH EVERY DAY 90 tablet 1   pravastatin (PRAVACHOL) 10 MG tablet Take 1 tablet (10 mg total) by mouth daily. 90 tablet 1   misoprostol (CYTOTEC) 200 MCG tablet Place 2 tablets vaginally the night prior to surgery (Patient not taking: Reported on 05/24/2022) 2 tablet 0   No current facility-administered medications for this visit.     ALLERGIES: Hydrocodone, Oxycodone, and Rosuvastatin  Family History  Problem Relation Age of Onset   Hypertension Mother    Stroke Mother 71   Hypertension Father    Diabetes Father    Hypertension Sister    Liver disease Sister        related to infected appendix   Diabetes Sister     Hypertension Brother    Diabetes Brother    Hypertension Daughter    Hypertension Sister    Diabetes Sister    Breast cancer Neg Hx     Social History   Socioeconomic History   Marital status: Divorced    Spouse name: Not on file   Number of children: Not on file   Years of education: Not on file   Highest education level: Not on file  Occupational History   Not on file  Tobacco Use   Smoking status: Never   Smokeless tobacco: Never  Vaping Use   Vaping Use: Never used  Substance and Sexual Activity   Alcohol use: No    Alcohol/week: 0.0 standard drinks of alcohol   Drug use: No   Sexual activity: Yes    Partners: Male    Birth control/protection: Post-menopausal  Other Topics Concern   Not on file  Social History Narrative   Work or School: retail; 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      Home Situation: lives with her friend       Spiritual Beliefs: no      Lifestyle: no regular exercise; diet is poor            Social Determinants of Radio broadcast assistant Strain: Not on file  Food Insecurity: No Food Insecurity (08/24/2020)   Hunger Vital Sign    Worried About Running Out of Food in the Last Year: Never true    Ran Out of Food in the Last Year: Never true  Transportation Needs: Not on file  Physical Activity: Not on file  Stress: Not on file  Social Connections: Not on file  Intimate Partner Violence: Not on file    Review of Systems  All other systems reviewed and are negative.   PHYSICAL EXAMINATION:    BP 128/80   Pulse 76   Ht '5\' 3"'$  (1.6 m)   Wt 206 lb (93.4 kg)   LMP 10/19/1998 (Approximate)   SpO2 100%   BMI 36.49 kg/m     General appearance: alert, cooperative and appears stated age Neck: no adenopathy, supple, symmetrical, trachea midline and thyroid {CHL AMB PHY EX THYROID NORM DEFAULT:[phone]::"normal to inspection and palpation"} Heart: regular rate and rhythm Lungs: CTAB Abdomen: soft, non-tender;  bowel sounds normal; no masses,  no organomegaly Extremities: normal, atraumatic, no cyanosis Skin: normal color, texture and turgor, no rashes or lesions Lymph: normal cervical supraclavicular  and inguinal nodes Neurologic: grossly normal

## 2022-05-24 NOTE — H&P (View-Only) (Signed)
GYNECOLOGY  VISIT   HPI: 61 y.o.   Divorced Black or [place] American Not Hispanic or Latino  female   [phone] with Patient's last menstrual period was 10/19/1998 (approximate).   here for pre op visit.   The patient presented with PMP bleeding and had a thickened endometrium with suspected polyp on ultrasound. We discussed the option of sonohysterogram and hysteroscopy and she desires hysteroscopy. She was also noted to have a 6.4 cm, avascular tubular structure in the left adnexa. This structure had grown from 4.9 cm when it was noted on CT in 7/23. CA 125 was normal, negative cervical cultures. Patient reviewed with Dr Berline Lopes in GYN Oncology who felt either f/u with MRI or laparoscopy was reasonable. The patient desires laparoscopy, we discussed BSO and she is in agreement.   GYNECOLOGIC HISTORY: Patient's last menstrual period was 10/19/1998 (approximate). Contraception:post menopausal  Menopausal hormone therapy: none         OB History     Gravida  2   Para  2   Term  2   Preterm      AB      Living  2      SAB      IAB      Ectopic      Multiple      Live Births  2              Patient Active Problem List   Diagnosis Date Noted   Type 2 diabetes mellitus with other specified complication ([place]) 76/73/4193   Hyperglycemia 06/19/2015   Essential hypertension 01/09/2015   Hyperlipemia 01/09/2015   Hodgkin disease, Hx of 2000, treated at [facility] 06/26/2014    Past Medical History:  Diagnosis Date   Allergy    Anxiety    no med   Depression    no med   Fibroid    History of migraine    Hx - last migraine 2 yrs ago, no longer a problem   Hodgkin disease ([place]) 1999   lymphocytic predominant Hodkin's 2000, s/p 4 weeks Rituxan, chlorambucil 10/21/99-04/19/00   Hx of iron deficiency anemia    Hx -per [facility] notes, since childhood, possible thalassemia   Hyperglycemia    Hyperlipemia    no med   Hypertension    LAD (lymphadenopathy), axillary    eval with onc  and gsu 2016   Obesity    STD (sexually transmitted disease)    gonorrhea about 30 years ago   SVD (spontaneous vaginal delivery)    x 2    Past Surgical History:  Procedure Laterality Date   AXILLARY LYMPH NODE BIOPSY Right 06/09/2016   Procedure: AXILLARY LYMPH NODE BIOPSY;  Surgeon: Jackolyn Confer, MD;  Location: [place];  Service: General;  Laterality: Right;   BREAST EXCISIONAL BIOPSY Left 2006   BREAST SURGERY Left    due to bloody discharge   COLONOSCOPY     [place] N/A 05/24/2016   Procedure: [place];  Surgeon: Salvadore Dom, MD;  Location: [facility];  Service: Gynecology;  Laterality: N/A;   LAPAROSCOPIC REMOVAL ABDOMINAL MASS  2000   LLQ    Current Outpatient Medications  Medication Sig Dispense Refill   Calcium Carb-Cholecalciferol (CALCIUM + D3 PO) Take 1 tablet by mouth daily.      Calcium Carbonate-Vitamin D 600-200 MG-UNIT TABS Take 600 mg by mouth daily.     Cholecalciferol (VITAMIN D3) 2000 UNITS TABS Take 2,000  Units by mouth daily.      hydrochlorothiazide (HYDRODIURIL) 25 MG tablet TAKE 1 TABLET BY MOUTH EVERY DAY 90 tablet 1   Ketoconazole 2 % FOAM Apply 1 application. topically 3 (three) times a week. 100 g 2   lisinopril (ZESTRIL) 10 MG tablet TAKE 1 TABLET BY MOUTH EVERY DAY 90 tablet 1   pravastatin (PRAVACHOL) 10 MG tablet Take 1 tablet (10 mg total) by mouth daily. 90 tablet 1   misoprostol (CYTOTEC) 200 MCG tablet Place 2 tablets vaginally the night prior to surgery (Patient not taking: Reported on 05/24/2022) 2 tablet 0   No current facility-administered medications for this visit.     ALLERGIES: Hydrocodone, Oxycodone, and Rosuvastatin  Family History  Problem Relation Age of Onset   Hypertension Mother    Stroke Mother 46   Hypertension Father    Diabetes Father    Hypertension Sister    Liver disease Sister        related to infected appendix   Diabetes Sister     Hypertension Brother    Diabetes Brother    Hypertension Daughter    Hypertension Sister    Diabetes Sister    Breast cancer Neg Hx     Social History   Socioeconomic History   Marital status: Divorced    Spouse name: Not on file   Number of children: Not on file   Years of education: Not on file   Highest education level: Not on file  Occupational History   Not on file  Tobacco Use   Smoking status: Never   Smokeless tobacco: Never  Vaping Use   Vaping Use: Never used  Substance and Sexual Activity   Alcohol use: No    Alcohol/week: 0.0 standard drinks of alcohol   Drug use: No   Sexual activity: Yes    Partners: Male    Birth control/protection: Post-menopausal  Other Topics Concern   Not on file  Social History Narrative   Work or School: retail; 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      Home Situation: lives with her friend       Spiritual Beliefs: no      Lifestyle: no regular exercise; diet is poor            Social Determinants of Radio broadcast assistant Strain: Not on file  Food Insecurity: No Food Insecurity (08/24/2020)   Hunger Vital Sign    Worried About Running Out of Food in the Last Year: Never true    Ran Out of Food in the Last Year: Never true  Transportation Needs: Not on file  Physical Activity: Not on file  Stress: Not on file  Social Connections: Not on file  Intimate Partner Violence: Not on file    Review of Systems  All other systems reviewed and are negative.   PHYSICAL EXAMINATION:    BP 128/80   Pulse 76   Ht '5\' 3"'$  (1.6 m)   Wt 206 lb (93.4 kg)   LMP 10/19/1998 (Approximate)   SpO2 100%   BMI 36.49 kg/m     General appearance: alert, cooperative and appears stated age Neck: no adenopathy, supple, symmetrical, trachea midline and thyroid normal to inspection and palpation Heart: regular rate and rhythm Lungs: CTAB Abdomen: soft, non-tender; bowel sounds normal; no masses,  no  organomegaly Extremities: normal, atraumatic, no cyanosis Skin: normal color, texture and turgor, no rashes or lesions Lymph: normal cervical supraclavicular and inguinal nodes Neurologic: grossly normal  1. Postmenopausal bleeding Benign endometrial biopsy, thickened endometrium. Discussed the option of sonohysterogram or hysteroscopy. She would prefer to proceed with hysteroscopy. Plan: hysteroscopy, dilation and curettage. Reviewed risks, including: bleeding, infection, uterine perforation, fluid overload, need for further surgery  2. Thickened endometrium  3. Cervical stenosis (uterine cervix) - misoprostol (CYTOTEC) 200 MCG tablet; Place 2 tablets vaginally the night prior to surgery  Dispense: 2 tablet; Refill: 0  4. Essential hypertension Controlled on medication  5. Prediabetes Recent HgbA1C was 6.4  6. Adnexal mass Normal CA 125, negative cervical cultures. The patient was reviewed with Dr Berline Lopes who felt laparoscopy was reasonable. Plan diagnostic laparoscopy, BSO. Risks reviewed, including but not limited to: bleeding, infection, damage to bowel/bladder/vessels/ureters, need for further surgery, including laparotomy

## 2022-05-25 ENCOUNTER — Encounter: Payer: Self-pay | Admitting: Obstetrics and Gynecology

## 2022-06-06 ENCOUNTER — Encounter (HOSPITAL_BASED_OUTPATIENT_CLINIC_OR_DEPARTMENT_OTHER): Payer: Self-pay | Admitting: Obstetrics and Gynecology

## 2022-06-06 NOTE — Anesthesia Preprocedure Evaluation (Addendum)
Anesthesia Evaluation  Patient identified by MRN, date of birth, ID band Patient awake    Reviewed: Allergy & Precautions, NPO status , Patient's Chart, lab work & pertinent test results, reviewed documented beta blocker date and time   Airway Mallampati: III  TM Distance: >3 FB Neck ROM: Full    Dental  (+) Dental Advisory Given, Missing, Caps   Pulmonary neg pulmonary ROS,    Pulmonary exam normal breath sounds clear to auscultation       Cardiovascular hypertension, Pt. on medications Normal cardiovascular exam Rhythm:Regular Rate:Normal     Neuro/Psych PSYCHIATRIC DISORDERS Anxiety Depression negative neurological ROS     GI/Hepatic negative GI ROS, Neg liver ROS,   Endo/Other  diabetes, Well Controlled, Type 2Obesity Hyperlipidemia  Renal/GU negative Renal ROS  negative genitourinary   Musculoskeletal  (+) Arthritis , Osteoarthritis,    Abdominal (+) + obese,   Peds  Hematology negative hematology ROS (+)   Anesthesia Other Findings   Reproductive/Obstetrics PMB Thickened endometrium Ovarian cyst                           Anesthesia Physical Anesthesia Plan  ASA: 2  Anesthesia Plan: General   Post-op Pain Management: Precedex, Dilaudid IV, Tylenol PO (pre-op)* and Ketamine IV*   Induction: Intravenous  PONV Risk Score and Plan: 4 or greater and Treatment may vary due to age or medical condition, Scopolamine patch - Pre-op, Midazolam, Dexamethasone and Ondansetron  Airway Management Planned: Oral ETT  Additional Equipment: None  Intra-op Plan:   Post-operative Plan: Extubation in OR  Informed Consent: I have reviewed the patients History and Physical, chart, labs and discussed the procedure including the risks, benefits and alternatives for the proposed anesthesia with the patient or authorized representative who has indicated his/her understanding and acceptance.      Dental advisory given  Plan Discussed with: CRNA and Anesthesiologist  Anesthesia Plan Comments:        Anesthesia Quick Evaluation

## 2022-06-07 ENCOUNTER — Ambulatory Visit (HOSPITAL_BASED_OUTPATIENT_CLINIC_OR_DEPARTMENT_OTHER): Payer: BC Managed Care – PPO | Admitting: Certified Registered Nurse Anesthetist

## 2022-06-07 ENCOUNTER — Ambulatory Visit (HOSPITAL_BASED_OUTPATIENT_CLINIC_OR_DEPARTMENT_OTHER)
Admission: RE | Admit: 2022-06-07 | Discharge: 2022-06-07 | Disposition: A | Discharge status: Home / Self Care | Payer: BC Managed Care – PPO | Attending: Obstetrics and Gynecology | Admitting: Obstetrics and Gynecology

## 2022-06-07 ENCOUNTER — Other Ambulatory Visit: Payer: Self-pay

## 2022-06-07 ENCOUNTER — Encounter (HOSPITAL_BASED_OUTPATIENT_CLINIC_OR_DEPARTMENT_OTHER)
Admission: RE | Disposition: A | Discharge status: Home / Self Care | Payer: Self-pay | Source: Home / Self Care | Attending: Obstetrics and Gynecology

## 2022-06-07 ENCOUNTER — Encounter (HOSPITAL_BASED_OUTPATIENT_CLINIC_OR_DEPARTMENT_OTHER): Payer: Self-pay | Admitting: Obstetrics and Gynecology

## 2022-06-07 DIAGNOSIS — R9389 Abnormal findings on diagnostic imaging of other specified body structures: Secondary | ICD-10-CM | POA: Diagnosis not present

## 2022-06-07 DIAGNOSIS — Z6836 Body mass index (BMI) 36.0-36.9, adult: Secondary | ICD-10-CM | POA: Diagnosis not present

## 2022-06-07 DIAGNOSIS — N840 Polyp of corpus uteri: Secondary | ICD-10-CM | POA: Insufficient documentation

## 2022-06-07 DIAGNOSIS — E785 Hyperlipidemia, unspecified: Secondary | ICD-10-CM | POA: Diagnosis not present

## 2022-06-07 DIAGNOSIS — N8502 Endometrial intraepithelial neoplasia [EIN]: Secondary | ICD-10-CM | POA: Diagnosis not present

## 2022-06-07 DIAGNOSIS — R896 Abnormal cytological findings in specimens from other organs, systems and tissues: Secondary | ICD-10-CM | POA: Diagnosis not present

## 2022-06-07 DIAGNOSIS — N7011 Chronic salpingitis: Secondary | ICD-10-CM | POA: Diagnosis not present

## 2022-06-07 DIAGNOSIS — N736 Female pelvic peritoneal adhesions (postinfective): Secondary | ICD-10-CM | POA: Insufficient documentation

## 2022-06-07 DIAGNOSIS — I10 Essential (primary) hypertension: Secondary | ICD-10-CM | POA: Insufficient documentation

## 2022-06-07 DIAGNOSIS — N950 Postmenopausal bleeding: Secondary | ICD-10-CM | POA: Diagnosis not present

## 2022-06-07 DIAGNOSIS — N882 Stricture and stenosis of cervix uteri: Secondary | ICD-10-CM | POA: Diagnosis not present

## 2022-06-07 DIAGNOSIS — E119 Type 2 diabetes mellitus without complications: Secondary | ICD-10-CM | POA: Diagnosis not present

## 2022-06-07 DIAGNOSIS — N9489 Other specified conditions associated with female genital organs and menstrual cycle: Secondary | ICD-10-CM | POA: Diagnosis not present

## 2022-06-07 DIAGNOSIS — E669 Obesity, unspecified: Secondary | ICD-10-CM | POA: Insufficient documentation

## 2022-06-07 HISTORY — PX: LAPAROSCOPIC SALPINGO OOPHERECTOMY: SHX5927

## 2022-06-07 HISTORY — PX: DILATATION & CURETTAGE/HYSTEROSCOPY WITH MYOSURE: SHX6511

## 2022-06-07 HISTORY — PX: LAPAROSCOPIC LYSIS OF ADHESIONS: SHX5905

## 2022-06-07 HISTORY — DX: Prediabetes: R73.03

## 2022-06-07 HISTORY — DX: Unspecified osteoarthritis, unspecified site: M19.90

## 2022-06-07 LAB — POCT I-STAT, CHEM 8
BUN: 18 mg/dL (ref 6–20)
Calcium, Ion: 1.23 mmol/L (ref 1.15–1.40)
Chloride: 100 mmol/L (ref 98–111)
Creatinine, Ser: 0.8 mg/dL (ref 0.44–1.00)
Glucose, Bld: 119 mg/dL — ABNORMAL HIGH (ref 70–99)
HCT: 40 % (ref 36.0–46.0)
Hemoglobin: 13.6 g/dL (ref 12.0–15.0)
Potassium: 3.4 mmol/L — ABNORMAL LOW (ref 3.5–5.1)
Sodium: 140 mmol/L (ref 135–145)
TCO2: 30 mmol/L (ref 22–32)

## 2022-06-07 SURGERY — SALPINGO-OOPHORECTOMY, LAPAROSCOPIC
Anesthesia: General | Site: Vagina

## 2022-06-07 MED ORDER — BUPIVACAINE HCL (PF) 0.25 % IJ SOLN
INTRAMUSCULAR | Status: DC | PRN
  Administered 2022-06-07: 10 mL

## 2022-06-07 MED ORDER — LIDOCAINE HCL (PF) 2 % IJ SOLN
INTRAMUSCULAR | Status: AC
  Filled 2022-06-07: qty 5

## 2022-06-07 MED ORDER — FENTANYL CITRATE (PF) 100 MCG/2ML IJ SOLN
INTRAMUSCULAR | Status: AC
  Filled 2022-06-07: qty 2

## 2022-06-07 MED ORDER — ONDANSETRON HCL 4 MG/2ML IJ SOLN
INTRAMUSCULAR | Status: AC
  Filled 2022-06-07: qty 2

## 2022-06-07 MED ORDER — LACTATED RINGERS IV SOLN: INTRAVENOUS | Status: DC

## 2022-06-07 MED ORDER — ROCURONIUM BROMIDE 10 MG/ML (PF) SYRINGE
PREFILLED_SYRINGE | INTRAVENOUS | Status: AC
  Filled 2022-06-07: qty 10

## 2022-06-07 MED ORDER — SUGAMMADEX SODIUM 200 MG/2ML IV SOLN
INTRAVENOUS | Status: DC | PRN
  Administered 2022-06-07: 200 mg via INTRAVENOUS

## 2022-06-07 MED ORDER — TRAMADOL HCL 50 MG PO TABS: 50.0000 mg | ORAL_TABLET | Freq: Four times a day (QID) | ORAL | 0 refills | Status: DC | PRN

## 2022-06-07 MED ORDER — ROCURONIUM BROMIDE 100 MG/10ML IV SOLN
INTRAVENOUS | Status: DC | PRN
  Administered 2022-06-07: 80 mg via INTRAVENOUS

## 2022-06-07 MED ORDER — SODIUM CHLORIDE 0.9 % IR SOLN
Status: DC | PRN
  Administered 2022-06-07: 3000 mL

## 2022-06-07 MED ORDER — ACETAMINOPHEN 500 MG PO TABS
ORAL_TABLET | ORAL | Status: AC
  Filled 2022-06-07: qty 2

## 2022-06-07 MED ORDER — PROPOFOL 10 MG/ML IV BOLUS
INTRAVENOUS | Status: AC
  Filled 2022-06-07: qty 20

## 2022-06-07 MED ORDER — FENTANYL CITRATE (PF) 100 MCG/2ML IJ SOLN
INTRAMUSCULAR | Status: DC | PRN
  Administered 2022-06-07: 50 ug via INTRAVENOUS
  Administered 2022-06-07: 25 ug via INTRAVENOUS
  Administered 2022-06-07: 100 ug via INTRAVENOUS
  Administered 2022-06-07 (×2): 50 ug via INTRAVENOUS
  Administered 2022-06-07: 25 ug via INTRAVENOUS

## 2022-06-07 MED ORDER — KETOROLAC TROMETHAMINE 30 MG/ML IJ SOLN
INTRAMUSCULAR | Status: DC | PRN
  Administered 2022-06-07: 30 mg via INTRAVENOUS

## 2022-06-07 MED ORDER — MIDAZOLAM HCL 2 MG/2ML IJ SOLN
INTRAMUSCULAR | Status: AC
  Filled 2022-06-07: qty 2

## 2022-06-07 MED ORDER — PHENYLEPHRINE 80 MCG/ML (10ML) SYRINGE FOR IV PUSH (FOR BLOOD PRESSURE SUPPORT)
PREFILLED_SYRINGE | INTRAVENOUS | Status: AC
  Filled 2022-06-07: qty 10

## 2022-06-07 MED ORDER — PROPOFOL 10 MG/ML IV BOLUS
INTRAVENOUS | Status: DC | PRN
  Administered 2022-06-07: 200 mg via INTRAVENOUS
  Administered 2022-06-07: 50 mg via INTRAVENOUS

## 2022-06-07 MED ORDER — ACETAMINOPHEN 500 MG PO TABS: 1000.0000 mg | ORAL_TABLET | Freq: Four times a day (QID) | ORAL | 2 refills | Status: AC | PRN

## 2022-06-07 MED ORDER — HYDROMORPHONE HCL 1 MG/ML IJ SOLN: 0.2500 mg | INTRAMUSCULAR | Status: DC | PRN

## 2022-06-07 MED ORDER — TRAMADOL HCL 50 MG PO TABS
50.0000 mg | ORAL_TABLET | Freq: Once | ORAL | Status: AC
  Administered 2022-06-07: 50 mg via ORAL

## 2022-06-07 MED ORDER — SCOPOLAMINE 1 MG/3DAYS TD PT72
MEDICATED_PATCH | TRANSDERMAL | Status: AC
  Filled 2022-06-07: qty 1

## 2022-06-07 MED ORDER — LIDOCAINE HCL (CARDIAC) PF 100 MG/5ML IV SOSY
PREFILLED_SYRINGE | INTRAVENOUS | Status: DC | PRN
  Administered 2022-06-07: 60 mg via INTRAVENOUS

## 2022-06-07 MED ORDER — PHENYLEPHRINE HCL (PRESSORS) 10 MG/ML IV SOLN
INTRAVENOUS | Status: DC | PRN
  Administered 2022-06-07 (×3): 80 ug via INTRAVENOUS

## 2022-06-07 MED ORDER — MIDAZOLAM HCL 5 MG/5ML IJ SOLN
INTRAMUSCULAR | Status: DC | PRN
  Administered 2022-06-07: 2 mg via INTRAVENOUS

## 2022-06-07 MED ORDER — ONDANSETRON HCL 4 MG/2ML IJ SOLN
4.0000 mg | Freq: Once | INTRAMUSCULAR | Status: AC | PRN
  Administered 2022-06-07: 4 mg via INTRAVENOUS

## 2022-06-07 MED ORDER — SODIUM CHLORIDE 0.9 % IV SOLN
INTRAVENOUS | Status: AC | PRN
  Administered 2022-06-07: 1000 mL

## 2022-06-07 MED ORDER — ACETAMINOPHEN 500 MG PO TABS
1000.0000 mg | ORAL_TABLET | ORAL | Status: AC
  Administered 2022-06-07: 1000 mg via ORAL

## 2022-06-07 MED ORDER — DEXAMETHASONE SODIUM PHOSPHATE 10 MG/ML IJ SOLN
INTRAMUSCULAR | Status: AC
  Filled 2022-06-07: qty 1

## 2022-06-07 MED ORDER — SCOPOLAMINE 1 MG/3DAYS TD PT72
1.0000 | MEDICATED_PATCH | TRANSDERMAL | Status: DC
  Administered 2022-06-07: 1.5 mg via TRANSDERMAL

## 2022-06-07 MED ORDER — TRAMADOL HCL 50 MG PO TABS
ORAL_TABLET | ORAL | Status: AC
  Filled 2022-06-07: qty 1

## 2022-06-07 MED ORDER — ONDANSETRON HCL 4 MG/2ML IJ SOLN
INTRAMUSCULAR | Status: DC | PRN
  Administered 2022-06-07: 4 mg via INTRAVENOUS

## 2022-06-07 MED ORDER — DEXAMETHASONE SODIUM PHOSPHATE 4 MG/ML IJ SOLN
INTRAMUSCULAR | Status: DC | PRN
  Administered 2022-06-07: 5 mg via INTRAVENOUS

## 2022-06-07 SURGICAL SUPPLY — 42 items
ADH SKN CLS APL DERMABOND .7 (GAUZE/BANDAGES/DRESSINGS) ×3
COVER MAYO STAND STRL (DRAPES) ×4 IMPLANT
COVER SURGICAL LIGHT HANDLE (MISCELLANEOUS) IMPLANT
DERMABOND ADVANCED .7 DNX12 (GAUZE/BANDAGES/DRESSINGS) ×4 IMPLANT
DEVICE MYOSURE REACH (MISCELLANEOUS) IMPLANT
DRAPE SHEET LG 3/4 BI-LAMINATE (DRAPES) IMPLANT
DRSG TELFA 3X8 NADH STRL (GAUZE/BANDAGES/DRESSINGS) ×4 IMPLANT
DURAPREP 26ML APPLICATOR (WOUND CARE) ×4 IMPLANT
ELECT REM PT RETURN 9FT ADLT (ELECTROSURGICAL) ×3
ELECTRODE REM PT RTRN 9FT ADLT (ELECTROSURGICAL) ×4 IMPLANT
GAUZE 4X4 16PLY ~~LOC~~+RFID DBL (SPONGE) ×8 IMPLANT
GLOVE BIO SURGEON STRL SZ 6.5 (GLOVE) ×8 IMPLANT
GLOVE BIOGEL PI IND STRL 6.5 (GLOVE) IMPLANT
GLOVE BIOGEL PI IND STRL 7.0 (GLOVE) ×8 IMPLANT
GLOVE SURG SS PI 7.0 STRL IVOR (GLOVE) IMPLANT
GOWN STRL REUS W/TWL LRG LVL3 (GOWN DISPOSABLE) ×8 IMPLANT
IV NS 1000ML (IV SOLUTION) ×3
IV NS 1000ML BAXH (IV SOLUTION) IMPLANT
IV NS IRRIG 3000ML ARTHROMATIC (IV SOLUTION) ×4 IMPLANT
KIT PROCEDURE FLUENT (KITS) ×4 IMPLANT
KIT TURNOVER CYSTO (KITS) ×4 IMPLANT
MANIFOLD NEPTUNE II (INSTRUMENTS) IMPLANT
NDL INSUFFLATION 14GA 120MM (NEEDLE) ×4 IMPLANT
NEEDLE INSUFFLATION 14GA 120MM (NEEDLE) ×3 IMPLANT
PACK LAPAROSCOPY BASIN (CUSTOM PROCEDURE TRAY) ×4 IMPLANT
PAD OB MATERNITY 4.3X12.25 (PERSONAL CARE ITEMS) ×4 IMPLANT
PAD PREP 24X48 CUFFED NSTRL (MISCELLANEOUS) ×4 IMPLANT
POUCH LAPAROSCOPIC INSTRUMENT (MISCELLANEOUS) ×4 IMPLANT
SEAL ROD LENS SCOPE MYOSURE (ABLATOR) ×4 IMPLANT
SET SUCTION IRRIG HYDROSURG (IRRIGATION / IRRIGATOR) IMPLANT
SET TUBE SMOKE EVAC HIGH FLOW (TUBING) ×4 IMPLANT
SHEARS HARMONIC ACE PLUS 36CM (ENDOMECHANICALS) IMPLANT
SUT VIC AB 4-0 PS2 18 (SUTURE) ×4 IMPLANT
SUT VICRYL 0 UR6 27IN ABS (SUTURE) IMPLANT
SYSTEM CARTER THOMASON II (TROCAR) IMPLANT
TOWEL OR 17X26 10 PK STRL BLUE (TOWEL DISPOSABLE) ×8 IMPLANT
TRAP SPECIMEN MUCUS 40CC (MISCELLANEOUS) IMPLANT
TRAY FOLEY W/BAG SLVR 14FR LF (SET/KITS/TRAYS/PACK) ×4 IMPLANT
TROCAR ADV FIXATION 5X100MM (TROCAR) ×4 IMPLANT
TROCAR Z-THREAD FIOS 11X100 BL (TROCAR) IMPLANT
TROCAR Z-THREAD FIOS 5X100MM (TROCAR) ×4 IMPLANT
WARMER LAPAROSCOPE (MISCELLANEOUS) ×4 IMPLANT

## 2022-06-07 NOTE — Interval H&P Note (Signed)
History and Physical Interval Note:  06/07/2022 7:09 AM  Whitney Santiago  has presented today for surgery, with the diagnosis of postmenopausal bleeding, thickened endometrium, left adnexal mass.  The various methods of treatment have been discussed with the patient and family. After consideration of risks, benefits and other options for treatment, the patient has consented to  Procedure(s): LAPAROSCOPIC SALPINGO OOPHORECTOMY (Bilateral) [place] (N/A) as a surgical intervention.  The patient's history has been reviewed, patient examined, no change in status, stable for surgery.  I have reviewed the patient's chart and labs.  Questions were answered to the patient's satisfaction.     Salvadore Dom

## 2022-06-07 NOTE — Op Note (Signed)
Preoperative Diagnosis: postmenopausal bleeding, thickened endometrium, left adnexal mass  Postoperative Diagnosis: postmenopausal bleeding, thickened endometrium, pelvic adhesions, bilateral hydrosalpinx    Procedure:  Diagnostic laparoscopy, lysis of adhesions, hysteroscopy, dilation and curettage  Surgeon: Dr Sumner Boast  Assistant: Dr Josefa Half, an MD assistant was necessary for tissue manipulation, retraction and positioning due to the complexity of the case and hospital policies   Anesthesia: General  EBL: 5 cc  Fluids: 700  Urine output: 200  Specimens: pelvic washings, endometrial curettings.  Fluid deficit: 0  Indications for surgery: The patient is a 61 year old female, who presented with postmenopausal bleeding. Ultrasound showed a thickened endometrium with a suspected polyp and a 6.4 cm avascular tubular structure in the left adnexa. The structure had grown from 4.9 cm from a month prior (noted on CT). CA 125 was normal. Cervical cultures were normal.  The patient is aware of the risks and complications involved with the surgery and consent was obtained prior to the procedure.  Findings: enlarged fibroid uterus, bilateral hydrosalpinx, unable to see either ovary, both tubes were adhesed over the ovaries, the right one to the posterior uterus and the left to the deep pelvic side wall. Given the large size of the uterus, it was not possible to take down the adnexa without performing a hysterectomy, access was very limited. Hysteroscopy: showed 2 focal areas of endometrial thickening, otherwise thin endometrium, normal tubal ostia bilaterally.   Procedure: The patient was taken to the operating room with an IV in placed. She was placed in the dorsal lithotomy position. General anesthesia was administered. She was prepped and draped in the usual sterile fashion for an abdominal, vaginal surgery. A hulka uterine manipulator was placed. A foley catheter was placed.   The  umbilicus was everted, injected with 0.25% marcaine and incised with a # 11 blade. 2 towel clips were used to elevated the umbilicus and a veress needle was placed into the abdominal cavity. The abdominal cavity was insufflated with CO2, with normal intraabdominal pressures. After adequate pneumo-insufflation the veress needle was removed and the 5 mm laparoscope was placed into the abdominal cavity using the opti-view trocar. The patient was placed in trendelenburg and the abdominal pelvic cavity was inspected. 3 more trocars were placed. 1 in either lateral, mid abdomen one approximately 10 cm superior to the pubic symphysis in the midline. These areas were injected with 0.25% marcaine, incised with a #11 blades. 5 mm trocar's were inserted in the bilateral mid abdomen and an 11 mm trocar in the midline. Both were inserted with direct visualization with the laparoscope. The abdominal pelvic cavity was again inspected. The ureters was identified on the left. Pelvic washings were obtained.   The harmonic scalpel was used to take down some of the pelvic adhesions. The manipulation of the uterus was minimal with the hulka so the decision was made to place the rumi device. The laparoscope was removed from the abdomen and attention was turned to the vaginal portion of the case. The hulka was removed and the single tooth tenaculum was placed at 12 o'clock.  The cervix was dilated to a #6 hagar dilator. The uterus was sounded to 10 cm. The myosure hysteroscope was inserted into the uterine cavity. With continuous infusion of normal saline, the uterine cavity was visualized with the above findings. The myosure reach was used to resect the areas of thickened endometrium. The myosure was then removed. The cavity was then curetted with the small sharp curette. The cavity  had the characteristically gritty texture at the end of the procedure. The curette was removed and the 10 cm rumi uterine manipulator was placed. The  single tooth tenaculum were removed.   Attention was then returned to the abdominal portion of the case. With use of the rumi manipulator there was some improvement in visualization. The filmy adhesions between the bowel and the pelvic side wall were taken down with the harmonic device. The left adnexa was densely adherent to the pelvic side wall. Given the limited mobilization of the large fibroid uterus, it was not possible to safely remove the adnexa without performing hysterectomy or myomectomy. Given the benign appearance of the adnexa and limited mobility of the uterus, the decision was made to stop the procedure.   The abdominal pelvic cavity was irrigated and suctioned dry.  The 11 mm midline trocar was removed and the [employer] device was used to place a stich of 0-Vicryl through the fascia. The abdominal cavity was desufflated and the trocars were removed. The skin was closed with subcuticular stiches of 4-0 vicryl and dermabond was placed over the incisions.  The rumi uterine manipulator and the foley catheter were removed.   The patient's abdomen and perineum were cleansed and she was taken out of the dorsal lithotomy position. Upon awakening she was extubated and taken to the recovery room in stable condition. The sponge and instrument counts were correct.

## 2022-06-07 NOTE — Transfer of Care (Signed)
Immediate Anesthesia Transfer of Care Note  Patient: Whitney Santiago  Procedure(s) Performed: Procedure(s) (LRB): DIAGNOSTIC LAPAROSCOPY/ PELVIC WASHING (Bilateral) DILATATION & CURETTAGE/HYSTEROSCOPY WITH MYOSURE (N/A)  Patient Location: PACU  Anesthesia Type: General  Level of Consciousness: awake, sedated, patient cooperative and responds to stimulation  Airway & Oxygen Therapy: Patient Spontanous Breathing and Patient connected to [place] oxygen  Post-op Assessment: Report given to PACU RN, Post -op Vital signs reviewed and stable and Patient moving all extremities  Post vital signs: Reviewed and stable  Complications: No apparent anesthesia complications

## 2022-06-07 NOTE — Progress Notes (Signed)
Time of 4:45 pm written on discharge instructions and reviewed with patient for next dose of tramadol. Voices understanding.

## 2022-06-07 NOTE — Anesthesia Postprocedure Evaluation (Signed)
Anesthesia Post Note  Patient: Armed forces training and education officer  Procedure(s) Performed: DIAGNOSTIC LAPAROSCOPY/ PELVIC WASHING (Bilateral: Abdomen) DILATATION & CURETTAGE/HYSTEROSCOPY WITH MYOSURE (Vagina )     Patient location during evaluation: PACU Anesthesia Type: General Level of consciousness: awake and alert and oriented Pain management: pain level controlled Vital Signs Assessment: post-procedure vital signs reviewed and stable Respiratory status: spontaneous breathing, nonlabored ventilation and respiratory function stable Cardiovascular status: blood pressure returned to baseline and stable Postop Assessment: no apparent nausea or vomiting Anesthetic complications: no   No notable events documented.  Last Vitals:  Vitals:   06/07/22 0930 06/07/22 0945  BP: (!) 133/57 121/61  Pulse: 79 64  Resp: (!) 21 13  Temp: 37.1 C   SpO2: 100% 100%    Last Pain:  Vitals:   06/07/22 1000  TempSrc:   PainSc: 0-No pain                 Miraj Truss A.

## 2022-06-07 NOTE — Discharge Instructions (Signed)
Post Anesthesia Home Care Instructions  Activity: Get plenty of rest for the remainder of the day. A responsible individual must stay with you for 24 hours following the procedure.  For the next 24 hours, DO NOT: -Drive a car -Paediatric nurse -Drink alcoholic beverages -Take any medication unless instructed by your physician -Make any legal decisions or sign important papers.  Meals: Start with liquid foods such as gelatin or soup. Progress to regular foods as tolerated. Avoid greasy, spicy, heavy foods. If nausea and/or vomiting occur, drink only clear liquids until the nausea and/or vomiting subsides. Call your physician if vomiting continues.  Special Instructions/Symptoms: Your throat may feel dry or sore from the anesthesia or the breathing tube placed in your throat during surgery. If this causes discomfort, gargle with warm salt water. The discomfort should disappear within 24 hours.  If you had a scopolamine patch placed behind your ear for the management of post- operative nausea and/or vomiting:  1. The medication in the patch is effective for 72 hours, after which it should be removed.  Wrap patch in a tissue and discard in the trash. Wash hands thoroughly with soap and water. 2. You may remove the patch earlier than 72 hours if you experience unpleasant side effects which may include dry mouth, dizziness or visual disturbances. 3. Avoid touching the patch. Wash your hands with soap and water after contact with the patch. Remove patch by Friday, 06/10/22      No ibuprofen, Advil, Aleve, Motrin, ketorolac, meloxicam, naproxen, or other NSAIDS until after 3:15 pm today if needed.  No acetaminophen/Tylenol until after 12 pm today if needed.    DISCHARGE INSTRUCTIONS: Laparoscopy  The following instructions have been prepared to help you care for yourself upon your return home today.  Wound care:  Do not get the incision wet for the first 24 hours. The incision should be  kept clean and dry.  The Band-Aids or dressings may be removed the day after surgery.  Should the incision become sore, red, and swollen after the first week, check with your doctor.  Personal hygiene:  Shower the day after your procedure.  Activity and limitations:  Do NOT drive or operate any equipment today.  Do NOT lift anything more than 15 pounds for 2-3 weeks after surgery.  Do NOT rest in bed all day.  Walking is encouraged. Walk each day, starting slowly with 5-minute walks 3 or 4 times a day. Slowly increase the length of your walks.  Walk up and down stairs slowly.  Do NOT do strenuous activities, such as golfing, playing tennis, bowling, running, biking, weight lifting, gardening, mowing, or vacuuming for 2-4 weeks. Ask your doctor when it is okay to start.  Diet: Eat a light meal as desired this evening. You may resume your usual diet tomorrow.  Return to work: This is dependent on the type of work you do. For the most part you can return to a desk job within a week of surgery. If you are more active at work, please discuss this with your doctor.  What to expect after your surgery: You may have a slight burning sensation when you urinate on the first day. You may have a very small amount of blood in the urine. Expect to have a small amount of vaginal discharge/light bleeding for 1-2 weeks. It is not unusual to have abdominal soreness and bruising for up to 2 weeks. You may be tired and need more rest for about 1 week. You  may experience shoulder pain for 24-72 hours. Lying flat in bed may relieve it.  Call your doctor for any of the following:  Develop a fever of 100.4 or greater  Inability to urinate 6 hours after discharge from hospital  Severe pain not relieved by pain medications  Persistent of heavy bleeding at incision site  Redness or swelling around incision site after a week  Increasing nausea or vomiting

## 2022-06-07 NOTE — Anesthesia Procedure Notes (Addendum)
Procedure Name: Intubation Date/Time: 06/07/2022 7:43 AM  Performed by: Justice Rocher, CRNAPre-anesthesia Checklist: Patient identified, Emergency Drugs available, Suction available, Patient being monitored and Timeout performed Patient Re-evaluated:Patient Re-evaluated prior to induction Oxygen Delivery Method: Circle system utilized Preoxygenation: Pre-oxygenation with 100% oxygen Induction Type: IV induction Ventilation: Mask ventilation without difficulty Laryngoscope Size: Mac and 4 Grade View: Grade II Tube type: Oral Tube size: 7.0 mm Number of attempts: 1 Airway Equipment and Method: Stylet and Oral airway Placement Confirmation: ETT inserted through vocal cords under direct vision, positive ETCO2, breath sounds checked- equal and bilateral and CO2 detector Secured at: 23 cm Tube secured with: Tape Dental Injury: Teeth and Oropharynx as per pre-operative assessment

## 2022-06-08 ENCOUNTER — Encounter (HOSPITAL_BASED_OUTPATIENT_CLINIC_OR_DEPARTMENT_OTHER): Payer: Self-pay | Admitting: Obstetrics and Gynecology

## 2022-06-09 LAB — SURGICAL PATHOLOGY

## 2022-06-10 ENCOUNTER — Telehealth: Payer: Self-pay

## 2022-06-10 ENCOUNTER — Telehealth: Payer: Self-pay | Admitting: *Deleted

## 2022-06-10 NOTE — Telephone Encounter (Signed)
As long as she isn't having signs of infection or drainage, she doesn't need to be seen. If she is worried, I will see her.

## 2022-06-10 NOTE — Telephone Encounter (Signed)
Patient called to make Dr. Alen Blew aware of the following.  She is s/p laparoscopy, hysteroscopy, & D&C on 06/07/22. Path report shows precancerous endometrial cells. Patient is to meet with surgeon on 06/23/22 to discuss next steps.   She did not request an appointment but asked if Dr. Alen Blew could look at her surgical documentation and reports "since he is my cancer doctor."  Routed to provider.

## 2022-06-10 NOTE — Telephone Encounter (Signed)
Patient called post DIAGNOSTIC LAPAROSCOPY/ PELVIC WASHING on 06/07/22. Patient noticed this morning the incision glue below her navel is broken off, no drainage of blood, the other incision sites appear to be fine and intact.   She asked if this is okay? Do you need to see her today? Please advise

## 2022-06-10 NOTE — Telephone Encounter (Signed)
Patient said okay.  She will monitor.

## 2022-06-13 LAB — CYTOLOGY - NON PAP

## 2022-06-14 ENCOUNTER — Encounter: Payer: Self-pay | Admitting: Obstetrics and Gynecology

## 2022-06-14 ENCOUNTER — Ambulatory Visit (INDEPENDENT_AMBULATORY_CARE_PROVIDER_SITE_OTHER): Payer: BC Managed Care – PPO | Admitting: Obstetrics and Gynecology

## 2022-06-14 ENCOUNTER — Telehealth: Payer: Self-pay | Admitting: *Deleted

## 2022-06-14 VITALS — BP 132/82 | HR 72 | Ht 63.0 in | Wt 207.0 lb

## 2022-06-14 DIAGNOSIS — N9489 Other specified conditions associated with female genital organs and menstrual cycle: Secondary | ICD-10-CM

## 2022-06-14 DIAGNOSIS — N8502 Endometrial intraepithelial neoplasia [EIN]: Secondary | ICD-10-CM

## 2022-06-14 NOTE — Progress Notes (Signed)
GYNECOLOGY  VISIT   HPI: 61 y.o.   Divorced Black or [place] American Not Hispanic or Latino  female   [phone] with Patient's last menstrual period was 10/19/1998 (approximate).   For a post op visit and to discuss bx results.   Last week she underwent a diagnostic laparoscopy, LOA, hysteroscopy, D&C. The plan was to perform a BSO for an adnexal mass, but her uterus was too large (fibroids) and her adnexa to adherent to get good access to perform the surgery. She appeared to have bilateral hydrosalpinx, ovaries were not seen.  Her pathology report returned with:  A. 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 No c/o.   GYNECOLOGIC HISTORY: Patient's last menstrual period was 10/19/1998 (approximate). Contraception:pmp  Menopausal hormone therapy: none         OB History     Gravida  2   Para  2   Term  2   Preterm      AB      Living  2      SAB      IAB      Ectopic      Multiple      Live Births  2              Patient Active Problem List   Diagnosis Date Noted   Pain in joint of left knee 02/25/2022   Pain in joint of right knee 02/25/2022   Type 2 diabetes mellitus with other specified complication ([place]) 24/23/5361   Hyperglycemia 06/19/2015   Essential hypertension 01/09/2015   Hyperlipemia 01/09/2015   Hodgkin disease, Hx of 2000, treated at [facility] 06/26/2014    Past Medical History:  Diagnosis Date   Allergy    Anxiety    no med   Arthritis    in both knees has received injections in the past. 05/24/2022   Depression    no med   Fibroid    History of migraine    Hx - last migraine 2 yrs ago, no longer a problem   Hodgkin disease ([place]) 1999   lymphocytic predominant Hodkin's 2000, s/p 4 weeks Rituxan, chlorambucil 10/21/99-04/19/00   Hx of iron deficiency anemia    Hx -per [facility] notes, since childhood, possible thalassemia   Hyperglycemia     Hyperlipemia    no med   Hypertension    LAD (lymphadenopathy), axillary    eval with onc and [facility] 2016   Obesity    Pre-diabetes    HgA1c 6.4 on 01/24/2022   STD (sexually transmitted disease)    gonorrhea about 30 years ago   SVD (spontaneous vaginal delivery)    x 2    Past Surgical History:  Procedure Laterality Date   AXILLARY LYMPH NODE BIOPSY Right 06/09/2016   Procedure: AXILLARY LYMPH NODE BIOPSY;  Surgeon: Jackolyn Confer, MD;  Location: [place];  Service: General;  Laterality: Right;   BREAST EXCISIONAL BIOPSY Left 2006   BREAST SURGERY Left    due to bloody discharge   COLONOSCOPY     [place] N/A 05/24/2016   Procedure: [place];  Surgeon: Salvadore Dom, MD;  Location: [facility];  Service: Gynecology;  Laterality: N/A;   DILATATION & CURETTAGE/HYSTEROSCOPY WITH MYOSURE N/A 06/07/2022   Procedure: DILATATION & CURETTAGE/HYSTEROSCOPY WITH MYOSURE;  Surgeon: Salvadore Dom, MD;  Location: [facility];  Service: Gynecology;  Laterality: N/A;   LAPAROSCOPIC LYSIS OF ADHESIONS  06/07/2022   Procedure: LAPAROSCOPIC LYSIS OF ADHESIONS;  Surgeon: Salvadore Dom, MD;  Location: [place];  Service: Gynecology;;   LAPAROSCOPIC REMOVAL ABDOMINAL MASS  2000   LLQ   LAPAROSCOPIC SALPINGO OOPHERECTOMY Bilateral 06/07/2022   Procedure: DIAGNOSTIC LAPAROSCOPY/ PELVIC WASHING;  Surgeon: Salvadore Dom, MD;  Location: [place];  Service: Gynecology;  Laterality: Bilateral;    Current Outpatient Medications  Medication Sig Dispense Refill   acetaminophen (TYLENOL) 500 MG tablet Take 2 tablets (1,000 mg total) by mouth every 6 (six) hours as needed. 100 tablet 2   Calcium Carb-Cholecalciferol (CALCIUM + D3 PO) Take 1 tablet by mouth daily.      Calcium Carbonate-Vitamin D 600-200 MG-UNIT TABS Take 600 mg by mouth daily.     Cholecalciferol  (VITAMIN D3) 2000 UNITS TABS Take 2,000 Units by mouth daily.      hydrochlorothiazide (HYDRODIURIL) 25 MG tablet TAKE 1 TABLET BY MOUTH EVERY DAY (Patient taking differently: Take 25 mg by mouth daily.) 90 tablet 1   Ketoconazole 2 % FOAM Apply 1 application. topically 3 (three) times a week. 100 g 2   lisinopril (ZESTRIL) 10 MG tablet TAKE 1 TABLET BY MOUTH EVERY DAY (Patient taking differently: Take 10 mg by mouth daily.) 90 tablet 1   pravastatin (PRAVACHOL) 10 MG tablet Take 1 tablet (10 mg total) by mouth daily. 90 tablet 1   traMADol (ULTRAM) 50 MG tablet Take 1 tablet (50 mg total) by mouth every 6 (six) hours as needed. 20 tablet 0   No current facility-administered medications for this visit.     ALLERGIES: Hydrocodone, Oxycodone, and Rosuvastatin  Family History  Problem Relation Age of Onset   Hypertension Mother    Stroke Mother 74   Hypertension Father    Diabetes Father    Hypertension Sister    Liver disease Sister        related to infected appendix   Diabetes Sister    Hypertension Brother    Diabetes Brother    Hypertension Daughter    Hypertension Sister    Diabetes Sister    Breast cancer Neg Hx     Social History   Socioeconomic History   Marital status: Divorced    Spouse name: Not on file   Number of children: Not on file   Years of education: Not on file   Highest education level: Not on file  Occupational History   Not on file  Tobacco Use   Smoking status: Never   Smokeless tobacco: Never  Vaping Use   Vaping Use: Never used  Substance and Sexual Activity   Alcohol use: No    Alcohol/week: 0.0 standard drinks of alcohol   Drug use: No   Sexual activity: Yes    Partners: Male    Birth control/protection: Post-menopausal  Other Topics Concern   Not on file  Social History Narrative   Work or School: retail; 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      Home Situation: lives with her friend       Spiritual Beliefs: no       Lifestyle: no regular exercise; diet is poor            Social Determinants of Health   Financial Resource Strain: Not on file  Food Insecurity: No Food Insecurity (08/24/2020)   Hunger Vital Sign    Worried About Running Out  of Food in the Last Year: Never true    Ran Out of Food in the Last Year: Never true  Transportation Needs: Not on file  Physical Activity: Not on file  Stress: Not on file  Social Connections: Not on file  Intimate Partner Violence: Not on file    Review of Systems  All other systems reviewed and are negative.   PHYSICAL EXAMINATION:    BP 132/82   Pulse 72   Ht '5\' 3"'$  (1.6 m)   Wt 207 lb (93.9 kg)   LMP 10/19/1998 (Approximate)   SpO2 100%   BMI 36.67 kg/m     General appearance: alert, cooperative and appears stated age Abdomen: soft, non-tender; non distended, no masses,  no organomegaly Incisions: healing well  1. Endometrial hyperplasia with atypia Will refer to GYN Oncology for surgery  2. Adnexal mass Laparoscopy was limited secondary to bulky fibroids and pelvic adhesions. She appeared to have hydrosalpinx. Normal CA 125

## 2022-06-14 NOTE — Telephone Encounter (Signed)
-----   Message from Salvadore Dom, MD sent at 06/14/2022  1:54 PM EDT ----- Please place referral to GYN oncology for endometrial hyperplasia with atypia.  Thanks, Sharee Pimple

## 2022-06-14 NOTE — Telephone Encounter (Signed)
Referral placed at [facility], they will call patient to schedule.

## 2022-06-16 ENCOUNTER — Telehealth: Payer: Self-pay

## 2022-06-16 NOTE — Telephone Encounter (Signed)
Attempted to call patient to schedule new patient visit.  Left message on voicemail

## 2022-06-16 NOTE — Telephone Encounter (Signed)
Spoke with the patient regarding the referral to GYN oncology. Patient scheduled as new patient with Dr Berline Lopes  on 06/28/2022. Patient given an arrival time of 8:30am.  Explained to the patient the the doctor will perform a pelvic exam at this visit. Patient given the policy that no visitors under the 16 yrs are allowed in the [place]. Patient given the address/phone number for the clinic and that the center offers free valet service.

## 2022-06-17 NOTE — Telephone Encounter (Signed)
Patient scheduled 06/28/22 with Dr.Tucker

## 2022-06-23 ENCOUNTER — Encounter (INDEPENDENT_AMBULATORY_CARE_PROVIDER_SITE_OTHER): Payer: BC Managed Care – PPO | Admitting: Obstetrics and Gynecology

## 2022-06-27 ENCOUNTER — Encounter: Payer: Self-pay | Admitting: Gynecologic Oncology

## 2022-06-28 ENCOUNTER — Encounter: Payer: Self-pay | Admitting: Gynecologic Oncology

## 2022-06-28 ENCOUNTER — Inpatient Hospital Stay (HOSPITAL_BASED_OUTPATIENT_CLINIC_OR_DEPARTMENT_OTHER): Payer: BC Managed Care – PPO | Admitting: Gynecologic Oncology

## 2022-06-28 ENCOUNTER — Inpatient Hospital Stay: Payer: BC Managed Care – PPO | Attending: Gynecologic Oncology | Admitting: Gynecologic Oncology

## 2022-06-28 ENCOUNTER — Other Ambulatory Visit: Payer: Self-pay

## 2022-06-28 VITALS — BP 126/49 | HR 80 | Temp 98.3°F | Resp 16 | Ht 63.0 in | Wt 209.0 lb

## 2022-06-28 DIAGNOSIS — N8502 Endometrial intraepithelial neoplasia [EIN]: Secondary | ICD-10-CM

## 2022-06-28 DIAGNOSIS — Z8572 Personal history of non-Hodgkin lymphomas: Secondary | ICD-10-CM

## 2022-06-28 DIAGNOSIS — D259 Leiomyoma of uterus, unspecified: Secondary | ICD-10-CM | POA: Insufficient documentation

## 2022-06-28 DIAGNOSIS — E669 Obesity, unspecified: Secondary | ICD-10-CM

## 2022-06-28 DIAGNOSIS — E785 Hyperlipidemia, unspecified: Secondary | ICD-10-CM | POA: Insufficient documentation

## 2022-06-28 DIAGNOSIS — C8105 Nodular lymphocyte predominant Hodgkin lymphoma, lymph nodes of inguinal region and lower limb: Secondary | ICD-10-CM

## 2022-06-28 DIAGNOSIS — F32A Depression, unspecified: Secondary | ICD-10-CM | POA: Insufficient documentation

## 2022-06-28 DIAGNOSIS — N7011 Chronic salpingitis: Secondary | ICD-10-CM | POA: Diagnosis not present

## 2022-06-28 DIAGNOSIS — Z6837 Body mass index (BMI) 37.0-37.9, adult: Secondary | ICD-10-CM | POA: Insufficient documentation

## 2022-06-28 DIAGNOSIS — F419 Anxiety disorder, unspecified: Secondary | ICD-10-CM | POA: Insufficient documentation

## 2022-06-28 DIAGNOSIS — I10 Essential (primary) hypertension: Secondary | ICD-10-CM | POA: Insufficient documentation

## 2022-06-28 DIAGNOSIS — Z780 Asymptomatic menopausal state: Secondary | ICD-10-CM | POA: Insufficient documentation

## 2022-06-28 DIAGNOSIS — M1990 Unspecified osteoarthritis, unspecified site: Secondary | ICD-10-CM | POA: Insufficient documentation

## 2022-06-28 DIAGNOSIS — Z79899 Other long term (current) drug therapy: Secondary | ICD-10-CM | POA: Insufficient documentation

## 2022-06-28 MED ORDER — SENNOSIDES-DOCUSATE SODIUM 8.6-50 MG PO TABS: 2.0000 | ORAL_TABLET | Freq: Every day | ORAL | 0 refills | Status: DC

## 2022-06-28 MED ORDER — TRAMADOL HCL 50 MG PO TABS: 50.0000 mg | ORAL_TABLET | Freq: Four times a day (QID) | ORAL | 0 refills | Status: DC | PRN

## 2022-06-28 NOTE — Progress Notes (Signed)
GYNECOLOGIC ONCOLOGY NEW PATIENT CONSULTATION   Patient Name: Whitney Santiago  Patient Age: 61 y.o. Date of Service: 06/28/22 Referring Provider: Sumner Boast, MD  Primary Care Provider: [place], Betty G, MD Consulting Provider: Jeral Pinch, MD   Assessment/Plan:  Postmenopausal patient with focal EIN, bilateral hydrosalpinx, and enlarged fibroid uterus.  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    Given risk of malignancy at the time of hysterectomy for EIN, I recommend definitive surgical management.  After discussion today, the patient desires to proceed with surgical management.    The patient is a suitable candidate for hysterectomy via a minimally invasive approach to surgery.  Given that she is postmenopausal, a bilateral salpingo-oophorectomy is also recommended.  We reviewed that robotic assistance would be used to complete the surgery.  We discussed that endometrial cancer is detected in about 40% of final uterine pathology specimens from patients with EIN.    We discussed 2 possible approaches for lymph node assessment.  In the setting of hyperplasia, lymph node removal would be unnecessary.  If malignancy is found and the patient meets Mayo criteria, then lymph node assessment would be warranted.  The first option for surgery would be to perform a sentinel lymph node biopsy.  This would allow for lymph node assessment if malignancy ultimately diagnosed on final pathology and limit the morbidity associated with lymph node removal.  I stressed that this would be an unnecessary procedure if malignancy not encountered.  The second option would be to plan to send the uterus for intraoperative frozen pathology.  If cancer is identified at the time of surgery, additional procedures including lymph node evaluation would be determined based on frozen  section results.  We reviewed the sentinel lymph node technique. Risks and benefits of sentinel lymph node biopsy was reviewed. We reviewed the technique and ICG dye. The patient DOES NOT have an iodine allergy or known liver dysfunction. We reviewed the false negative rate (0.4%), and that 3% of patients with metastatic disease will not have it detected by SLN biopsy in endometrial cancer. A low risk of allergic reaction to the dye, <0.2% for ICG, has been reported. We also discussed that in the case of failed mapping, which occurs 40% of the time, a bilateral or unilateral lymphadenectomy will be performed at the surgeon's discretion.   Potential benefits of sentinel nodes including a higher detection rate for metastasis due to ultrastaging and potential reduction in operative morbidity. However, there remains uncertainty as to the role for treatment of micrometastatic disease. Further, the benefit of operative morbidity associated with the SLN technique in endometrial cancer is not yet completely known. In other patient populations (e.g. the cervical cancer population) there has been observed reductions in morbidity with SLN biopsy compared to pelvic lymphadenectomy. Lymphedema, nerve dysfunction and lymphocysts are all potential risks with the SLN technique as with complete lymphadenectomy. Additional risks to the patient include the risk of damage to an internal organ while operating in an altered view (e.g. the black and white image of the robotic fluorescence imaging mode).   We discussed the plan for a robotic assisted hysterectomy, bilateral salpingo-oophorectomy, sentinel lymph node evaluation, possible lymph node dissection, mini laparotomy.  Given her fibroid uterus, I suspect she may need a mini laparotomy for specimen removal.  Plan will be to detach bilateral adnexa given the hydrosalpinges seen on imaging and at the time of her recent  diagnostic laparoscopy. The risks of surgery were discussed  in detail and she understands these to include infection; wound separation; hernia; vaginal cuff separation, injury to adjacent organs such as bowel, bladder, blood vessels, ureters and nerves; bleeding which may require blood transfusion; anesthesia risk; thromboembolic events; possible death; unforeseen complications; possible need for re-exploration; medical complications such as heart attack, stroke, pleural effusion and pneumonia; and, if full lymphadenectomy is performed the risk of lymphedema and lymphocyst. The patient will receive DVT and antibiotic prophylaxis as indicated. She voiced a clear understanding. She had the opportunity to ask questions. Perioperative instructions were reviewed with her. Prescriptions for post-op medications were sent to her pharmacy of choice.  A copy of this note was sent to the patient's referring provider.   65 minutes of total time was spent for this patient encounter, including preparation, face-to-face counseling with the patient and coordination of care, and documentation of the encounter.  Jeral Pinch, MD  Division of Gynecologic Oncology  Department of Obstetrics and Gynecology  [facility]  ___________________________________________  Chief Complaint: No chief complaint on file.   History of Present Illness:  Whitney Santiago is a 61 y.o. y.o. female who is seen in consultation at the request of Dr. Talbert Nan for an evaluation of endometrial intraepithelial neoplasia.  The patient was seen in early July 2023 for postmenopausal bleeding.  He describes getting a steroid injection for her knee and then developing 8 days of heavy menstrual-like bleeding with passage of clots.  She denies any associated cramping at the time.  She denies any bleeding since that episode.  She has a history of postmenopausal bleeding at the end of 2021.  Ultrasound at that time showed thickened endometrium and an endometrial biopsy returned with  inactive endometrium.  Recommendation was for sonohysterogram or hysteroscopy but the patient was lost to follow-up.  CT of the abdomen and pelvis on 04/11/2022 to assess treatment response of the patient's nodular lymphocytic predominant Hodgkin's lymphoma.  This showed an incidental finding of a septated 4.9 cm left adnexal cystic lesion measuring up to 4.9 cm.  Pelvic ultrasound on 05/05/2022 shows a uterus measuring 8.2 x 10 x 13.8 cm with several fibroids, the largest of which measures 7.4 x 8.2.  Endometrium is noted to be 7.5 cm and asymmetric, possible with a polyp with a feeder vessel measuring almost 1 cm.  Left ovary normal in appearance.  Large avascular tubular structure seen in the left adnexa measuring 6.4 x 2.8 x 4.6 cm, appears tortuous, suspicious for hydrosalpinx.  Right ovary normal in appearance.  Trace free fluid.  CA-125 in August was 10.  Patient was taken for diagnostic laparoscopy, lysis of adhesions, hysteroscopy, and D&C.  Findings at the time of surgery were enlarged fibroid uterus, bilateral hydrosalpinx.  Both fallopian tubes were adherent to the ovaries, neither of which was able to be seen well.  Given the large size of the uterus, it was deemed not feasible to take down the adnexa without performing a hysterectomy.  On hysteroscopy, there were 2 focal areas of endometrial thickening, otherwise normal and thin endometrium.  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  Only focal atypia identified.  Most of the hyperplasia seen within the polyp does not show atypia.  Patient reports doing well since surgery.  She denies any bleeding or pain.  She reports normal bowel and bladder function.  She denies any recent weight changes or appetite changes.  Patient's last hemoglobin A1c in  May was 6.4%.  Her history is notable for recurrent Hodgkin's lymphoma.  This was diagnosed and treated initially in 2000 and with a reported left inguinal enlarged lymph  node.  Biopsy of this revealed Hodgkin's lymphoma.  She was treated with systemic therapy and achieved complete remission.  She was then diagnosed with disease recurrence in 2015 when she was found to have left axillary lymphadenopathy.  Biopsy of the lymph node in the axilla in 2017 showed recurrent Hodgkin's lymphoma.  Given the indolent nature of her disease, no treatment has been pursued.  PAST MEDICAL HISTORY:  Past Medical History:  Diagnosis Date   Allergy    Anxiety    no med   Arthritis    in both knees has received injections in the past. 05/24/2022   Depression    no med   Fibroid    History of migraine    Hx - last migraine 2 yrs ago, no longer a problem   Hodgkin disease ([place]) 1999   lymphocytic predominant Hodkin's 2000, s/p 4 weeks Rituxan, chlorambucil 10/21/99-04/19/00   Hx of iron deficiency anemia    Hx -per [facility] notes, since childhood, possible thalassemia   Hyperglycemia    Hyperlipemia    no med   Hypertension    LAD (lymphadenopathy), axillary    eval with onc and [facility] 2016   Obesity    Pre-diabetes    HgA1c 6.4 on 01/24/2022   STD (sexually transmitted disease)    gonorrhea about 30 years ago   SVD (spontaneous vaginal delivery)    x 2     PAST SURGICAL HISTORY:  Past Surgical History:  Procedure Laterality Date   AXILLARY LYMPH NODE BIOPSY Right 06/09/2016   Procedure: AXILLARY LYMPH NODE BIOPSY;  Surgeon: Jackolyn Confer, MD;  Location: [place];  Service: General;  Laterality: Right;   BREAST EXCISIONAL BIOPSY Left 2006   BREAST SURGERY Left    due to bloody discharge   COLONOSCOPY     [place] N/A 05/24/2016   Procedure: [place];  Surgeon: Salvadore Dom, MD;  Location: [facility];  Service: Gynecology;  Laterality: N/A;   DILATATION & CURETTAGE/HYSTEROSCOPY WITH MYOSURE N/A 06/07/2022   Procedure: DILATATION & CURETTAGE/HYSTEROSCOPY WITH MYOSURE;  Surgeon: Salvadore Dom, MD;  Location: [place];  Service: Gynecology;  Laterality: N/A;   LAPAROSCOPIC LYSIS OF ADHESIONS  06/07/2022   Procedure: LAPAROSCOPIC LYSIS OF ADHESIONS;  Surgeon: Salvadore Dom, MD;  Location: [place];  Service: Gynecology;;   LAPAROSCOPIC REMOVAL ABDOMINAL MASS  2000   LLQ in the setting of Hodgkins   LAPAROSCOPIC SALPINGO OOPHERECTOMY Bilateral 06/07/2022   Procedure: DIAGNOSTIC LAPAROSCOPY/ [place];  Surgeon: Salvadore Dom, MD;  Location: [facility];  Service: Gynecology;  Laterality: Bilateral;    OB/GYN HISTORY:  OB History  Gravida Para Term Preterm AB Living  '2 2 2     2  '$ SAB IAB Ectopic Multiple Live Births          2    # Outcome Date GA Lbr Len/2nd Weight Sex Delivery Anes PTL Lv  2 Term      Vag-Spont   LIV  1 Term      Vag-Spont   LIV    Patient's last menstrual period was 10/19/1998 (approximate).  Age at menarche: 53 Age at menopause: Menses stopped at 32 when the patient received treatment for her Hodgkin's lymphoma Hx of  HRT: Denies Hx of STDs: Yes, remote history Last pap: 06/2020 - NIML, HR HP negative History of abnormal pap smears: Denies  SCREENING STUDIES:  Last mammogram: 10/2021  Last colonoscopy: 2016  MEDICATIONS: Outpatient Encounter Medications as of 06/28/2022  Medication Sig   acetaminophen (TYLENOL) 500 MG tablet Take 2 tablets (1,000 mg total) by mouth every 6 (six) hours as needed.   Calcium Carb-Cholecalciferol (CALCIUM + D3 PO) Take 1 tablet by mouth daily.    Calcium Carbonate-Vitamin D 600-200 MG-UNIT TABS Take 600 mg by mouth daily.   Cholecalciferol (VITAMIN D3) 2000 UNITS TABS Take 2,000 Units by mouth daily.    hydrochlorothiazide (HYDRODIURIL) 25 MG tablet TAKE 1 TABLET BY MOUTH EVERY DAY (Patient taking differently: Take 25 mg by mouth daily.)   Ketoconazole 2 % FOAM Apply 1 application. topically 3 (three) times a week.   lisinopril (ZESTRIL) 10  MG tablet TAKE 1 TABLET BY MOUTH EVERY DAY (Patient taking differently: Take 10 mg by mouth daily.)   pravastatin (PRAVACHOL) 10 MG tablet Take 1 tablet (10 mg total) by mouth daily.   senna-docusate (SENOKOT-S) 8.6-50 MG tablet Take 2 tablets by mouth at bedtime. For AFTER surgery, do not take if having diarrhea   traMADol (ULTRAM) 50 MG tablet Take 1 tablet (50 mg total) by mouth every 6 (six) hours as needed for severe pain. For AFTER surgery only, do not take or drive   [DISCONTINUED] traMADol (ULTRAM) 50 MG tablet Take 1 tablet (50 mg total) by mouth every 6 (six) hours as needed.   No facility-administered encounter medications on file as of 06/28/2022.    ALLERGIES:  Allergies  Allergen Reactions   Hydrocodone Hives   Oxycodone Hives   Rosuvastatin     Muscle aches     FAMILY HISTORY:  Family History  Problem Relation Age of Onset   Hypertension Mother    Stroke Mother 57   Hypertension Father    Diabetes Father    Hypertension Sister    Liver disease Sister        related to infected appendix   Diabetes Sister    Breast cancer Sister    Hypertension Sister    Diabetes Sister    Hypertension Brother    Diabetes Brother    Hypertension Daughter    Colon cancer Neg Hx    Ovarian cancer Neg Hx    Endometrial cancer Neg Hx    Pancreatic cancer Neg Hx    Prostate cancer Neg Hx      SOCIAL HISTORY:  Social Connections: Not on file    REVIEW OF SYSTEMS:  Denies appetite changes, fevers, chills, fatigue, unexplained weight changes. Denies hearing loss, neck lumps or masses, mouth sores, ringing in ears or voice changes. Denies cough or wheezing.  Denies shortness of breath. Denies chest pain or palpitations. Denies leg swelling. Denies abdominal distention, pain, blood in stools, constipation, diarrhea, nausea, vomiting, or early satiety. Denies pain with intercourse, dysuria, frequency, hematuria or incontinence. Denies hot flashes, pelvic pain, vaginal bleeding  or vaginal discharge.   Denies joint pain, back pain or muscle pain/cramps. Denies itching, rash, or wounds. Denies dizziness, headaches, numbness or seizures. Denies swollen lymph nodes or glands, denies easy bruising or bleeding. Denies anxiety, depression, confusion, or decreased concentration.  Physical Exam:  Vital Signs for this encounter:  Blood pressure (!) 126/49, pulse 80, temperature 98.3 F (36.8 C), temperature source Oral, resp. rate 16, height '5\' 3"'$  (1.6 m), weight 209 lb (94.8 kg), last  menstrual period 10/19/1998. Body mass index is 37.02 kg/m. General: Alert, oriented, no acute distress.  HEENT: Normocephalic, atraumatic. Sclera anicteric.  Chest: Clear to auscultation bilaterally. No wheezes, rhonchi, or rales. Cardiovascular: Regular rate and rhythm, no murmurs, rubs, or gallops.  Abdomen: Obese. Normoactive bowel sounds. Soft, nondistended, nontender to palpation. No masses or hepatosplenomegaly appreciated. No palpable fluid wave.  Well-healed laparoscopic incisions. Extremities: Grossly normal range of motion. Warm, well perfused. No edema bilaterally.  Skin: No rashes or lesions.  Lymphatics: No cervical, supraclavicular, or inguinal adenopathy.  GU:  Normal external female genitalia. No lesions. No discharge or bleeding.             Bladder/urethra:  No lesions or masses, well supported bladder             Vagina: Well rugated, no lesions or masses.             Cervix: Normal appearing, no lesions.             Uterus: Enlarged, approximately 14 cm with anterior fibroid palpated.  Somewhat mobile, fullness appreciated in the cul-de-sac that is smooth. No parametrial involvement or nodularity.             Adnexa: see above.  Rectal: Deferred.  LABORATORY AND RADIOLOGIC DATA:  Outside medical records were reviewed to synthesize the above history, along with the history and physical obtained during the visit.   Lab Results  Component Value Date   WBC 12.2 (H)  03/18/2022   HGB 13.6 06/07/2022   HCT 40.0 06/07/2022   PLT 393 03/18/2022   GLUCOSE 119 (H) 06/07/2022   CHOL 211 (H) 01/24/2022   TRIG 85.0 01/24/2022   HDL 56.20 01/24/2022   LDLCALC 138 (H) 01/24/2022   ALT 15 03/18/2022   AST 16 03/18/2022   NA 140 06/07/2022   K 3.4 (L) 06/07/2022   CL 100 06/07/2022   CREATININE 0.80 06/07/2022   BUN 18 06/07/2022   CO2 30 03/18/2022   TSH 0.83 10/27/2021   HGBA1C 6.4 01/24/2022   MICROALBUR 1.8 07/26/2021

## 2022-06-28 NOTE — H&P (View-Only) (Signed)
GYNECOLOGIC ONCOLOGY NEW PATIENT CONSULTATION   Patient Name: Whitney Santiago  Patient Age: 61 y.o. Date of Service: 06/28/22 Referring Provider: Sumner Boast, MD  Primary Care Provider: Martin[place], Betty G, MD Consulting Provider: Jeral Pinch, MD   Assessment/Plan:  Postmenopausal patient with focal EIN, bilateral hydrosalpinx, and enlarged fibroid uterus.  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    Given risk of malignancy at the time of hysterectomy for EIN, I recommend definitive surgical management.  After discussion today, the patient desires to proceed with surgical management.    The patient is a suitable candidate for hysterectomy via a minimally invasive approach to surgery.  Given that she is postmenopausal, a bilateral salpingo-oophorectomy is also recommended.  We reviewed that robotic assistance would be used to complete the surgery.  We discussed that endometrial cancer is detected in about 40% of final uterine pathology specimens from patients with EIN.    We discussed 2 possible approaches for lymph node assessment.  In the setting of hyperplasia, lymph node removal would be unnecessary.  If malignancy is found and the patient meets Mayo criteria, then lymph node assessment would be warranted.  The first option for surgery would be to perform a sentinel lymph node biopsy.  This would allow for lymph node assessment if malignancy ultimately diagnosed on final pathology and limit the morbidity associated with lymph node removal.  I stressed that this would be an unnecessary procedure if malignancy not encountered.  The second option would be to plan to send the uterus for intraoperative frozen pathology.  If cancer is identified at the time of surgery, additional procedures including lymph node evaluation would be determined based on frozen  section results.  We reviewed the sentinel lymph node technique. Risks and benefits of sentinel lymph node biopsy was reviewed. We reviewed the technique and ICG dye. The patient DOES NOT have an iodine allergy or known liver dysfunction. We reviewed the false negative rate (0.4%), and that 3% of patients with metastatic disease will not have it detected by SLN biopsy in endometrial cancer. A low risk of allergic reaction to the dye, <0.2% for ICG, has been reported. We also discussed that in the case of failed mapping, which occurs 40% of the time, a bilateral or unilateral lymphadenectomy will be performed at the surgeon's discretion.   Potential benefits of sentinel nodes including a higher detection rate for metastasis due to ultrastaging and potential reduction in operative morbidity. However, there remains uncertainty as to the role for treatment of micrometastatic disease. Further, the benefit of operative morbidity associated with the SLN technique in endometrial cancer is not yet completely known. In other patient populations (e.g. the cervical cancer population) there has been observed reductions in morbidity with SLN biopsy compared to pelvic lymphadenectomy. Lymphedema, nerve dysfunction and lymphocysts are all potential risks with the SLN technique as with complete lymphadenectomy. Additional risks to the patient include the risk of damage to an internal organ while operating in an altered view (e.g. the black and white image of the robotic fluorescence imaging mode).   We discussed the plan for a robotic assisted hysterectomy, bilateral salpingo-oophorectomy, sentinel lymph node evaluation, possible lymph node dissection, mini laparotomy.  Given her fibroid uterus, I suspect she may need a mini laparotomy for specimen removal.  Plan will be to detach bilateral adnexa given the hydrosalpinges seen on imaging and at the time of her recent  diagnostic laparoscopy. The risks of surgery were discussed  in detail and she understands these to include infection; wound separation; hernia; vaginal cuff separation, injury to adjacent organs such as bowel, bladder, blood vessels, ureters and nerves; bleeding which may require blood transfusion; anesthesia risk; thromboembolic events; possible death; unforeseen complications; possible need for re-exploration; medical complications such as heart attack, stroke, pleural effusion and pneumonia; and, if full lymphadenectomy is performed the risk of lymphedema and lymphocyst. The patient will receive DVT and antibiotic prophylaxis as indicated. She voiced a clear understanding. She had the opportunity to ask questions. Perioperative instructions were reviewed with her. Prescriptions for post-op medications were sent to her pharmacy of choice.  A copy of this note was sent to the patient's referring provider.   65 minutes of total time was spent for this patient encounter, including preparation, face-to-face counseling with the patient and coordination of care, and documentation of the encounter.  Jeral Pinch, MD  Division of Gynecologic Oncology  Department of Obstetrics and Gynecology  [facility]  ___________________________________________  Chief Complaint: No chief complaint on file.   History of Present Illness:  Whitney Santiago is a 61 y.o. y.o. female who is seen in consultation at the request of Dr. Talbert Nan for an evaluation of endometrial intraepithelial neoplasia.  The patient was seen in early July 2023 for postmenopausal bleeding.  He describes getting a steroid injection for her knee and then developing 8 days of heavy menstrual-like bleeding with passage of clots.  She denies any associated cramping at the time.  She denies any bleeding since that episode.  She has a history of postmenopausal bleeding at the end of 2021.  Ultrasound at that time showed thickened endometrium and an endometrial biopsy returned with  inactive endometrium.  Recommendation was for sonohysterogram or hysteroscopy but the patient was lost to follow-up.  CT of the abdomen and pelvis on 04/11/2022 to assess treatment response of the patient's nodular lymphocytic predominant Hodgkin's lymphoma.  This showed an incidental finding of a septated 4.9 cm left adnexal cystic lesion measuring up to 4.9 cm.  Pelvic ultrasound on 05/05/2022 shows a uterus measuring 8.2 x 10 x 13.8 cm with several fibroids, the largest of which measures 7.4 x 8.2.  Endometrium is noted to be 7.5 cm and asymmetric, possible with a polyp with a feeder vessel measuring almost 1 cm.  Left ovary normal in appearance.  Large avascular tubular structure seen in the left adnexa measuring 6.4 x 2.8 x 4.6 cm, appears tortuous, suspicious for hydrosalpinx.  Right ovary normal in appearance.  Trace free fluid.  CA-125 in August was 10.  Patient was taken for diagnostic laparoscopy, lysis of adhesions, hysteroscopy, and D&C.  Findings at the time of surgery were enlarged fibroid uterus, bilateral hydrosalpinx.  Both fallopian tubes were adherent to the ovaries, neither of which was able to be seen well.  Given the large size of the uterus, it was deemed not feasible to take down the adnexa without performing a hysterectomy.  On hysteroscopy, there were 2 focal areas of endometrial thickening, otherwise normal and thin endometrium.  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  Only focal atypia identified.  Most of the hyperplasia seen within the polyp does not show atypia.  Patient reports doing well since surgery.  She denies any bleeding or pain.  She reports normal bowel and bladder function.  She denies any recent weight changes or appetite changes.  Patient's last hemoglobin A1c in  May was 6.4%.  Her history is notable for recurrent Hodgkin's lymphoma.  This was diagnosed and treated initially in 2000 and with a reported left inguinal enlarged lymph  node.  Biopsy of this revealed Hodgkin's lymphoma.  She was treated with systemic therapy and achieved complete remission.  She was then diagnosed with disease recurrence in 2015 when she was found to have left axillary lymphadenopathy.  Biopsy of the lymph node in the axilla in 2017 showed recurrent Hodgkin's lymphoma.  Given the indolent nature of her disease, no treatment has been pursued.  PAST MEDICAL HISTORY:  Past Medical History:  Diagnosis Date   Allergy    Anxiety    no med   Arthritis    in both knees has received injections in the past. 05/24/2022   Depression    no med   Fibroid    History of migraine    Hx - last migraine 2 yrs ago, no longer a problem   Hodgkin disease ([place]) 1999   lymphocytic predominant Hodkin's 2000, s/p 4 weeks Rituxan, chlorambucil 10/21/99-04/19/00   Hx of iron deficiency anemia    Hx -per [facility] notes, since childhood, possible thalassemia   Hyperglycemia    Hyperlipemia    no med   Hypertension    LAD (lymphadenopathy), axillary    eval with onc and [facility] 2016   Obesity    Pre-diabetes    HgA1c 6.4 on 01/24/2022   STD (sexually transmitted disease)    gonorrhea about 30 years ago   SVD (spontaneous vaginal delivery)    x 2     PAST SURGICAL HISTORY:  Past Surgical History:  Procedure Laterality Date   AXILLARY LYMPH NODE BIOPSY Right 06/09/2016   Procedure: AXILLARY LYMPH NODE BIOPSY;  Surgeon: Jackolyn Confer, MD;  Location: [place];  Service: General;  Laterality: Right;   BREAST EXCISIONAL BIOPSY Left 2006   BREAST SURGERY Left    due to bloody discharge   COLONOSCOPY     [place] N/A 05/24/2016   Procedure: [place];  Surgeon: Salvadore Dom, MD;  Location: [facility];  Service: Gynecology;  Laterality: N/A;   DILATATION & CURETTAGE/HYSTEROSCOPY WITH MYOSURE N/A 06/07/2022   Procedure: DILATATION & CURETTAGE/HYSTEROSCOPY WITH MYOSURE;  Surgeon: Salvadore Dom, MD;  Location: [place];  Service: Gynecology;  Laterality: N/A;   LAPAROSCOPIC LYSIS OF ADHESIONS  06/07/2022   Procedure: LAPAROSCOPIC LYSIS OF ADHESIONS;  Surgeon: Salvadore Dom, MD;  Location: [place];  Service: Gynecology;;   LAPAROSCOPIC REMOVAL ABDOMINAL MASS  2000   LLQ in the setting of Hodgkins   LAPAROSCOPIC SALPINGO OOPHERECTOMY Bilateral 06/07/2022   Procedure: DIAGNOSTIC LAPAROSCOPY/ [place];  Surgeon: Salvadore Dom, MD;  [facility];  Service: Gynecology;  Laterality: Bilateral;    OB/GYN HISTORY:  OB History  Gravida Para Term Preterm AB Living  '2 2 2     2  '$ SAB IAB Ectopic Multiple Live Births          2    # Outcome Date GA Lbr Len/2nd Weight Sex Delivery Anes PTL Lv  2 Term      Vag-Spont   LIV  1 Term      Vag-Spont   LIV    Patient's last menstrual period was 10/19/1998 (approximate).  Age at menarche: 89 Age at menopause: Menses stopped at 57 when the patient received treatment for her Hodgkin's lymphoma Hx of  HRT: Denies Hx of STDs: Yes, remote history Last pap: 06/2020 - NIML, HR HP negative History of abnormal pap smears: Denies  SCREENING STUDIES:  Last mammogram: 10/2021  Last colonoscopy: 2016  MEDICATIONS: Outpatient Encounter Medications as of 06/28/2022  Medication Sig   acetaminophen (TYLENOL) 500 MG tablet Take 2 tablets (1,000 mg total) by mouth every 6 (six) hours as needed.   Calcium Carb-Cholecalciferol (CALCIUM + D3 PO) Take 1 tablet by mouth daily.    Calcium Carbonate-Vitamin D 600-200 MG-UNIT TABS Take 600 mg by mouth daily.   Cholecalciferol (VITAMIN D3) 2000 UNITS TABS Take 2,000 Units by mouth daily.    hydrochlorothiazide (HYDRODIURIL) 25 MG tablet TAKE 1 TABLET BY MOUTH EVERY DAY (Patient taking differently: Take 25 mg by mouth daily.)   Ketoconazole 2 % FOAM Apply 1 application. topically 3 (three) times a week.   lisinopril (ZESTRIL) 10  MG tablet TAKE 1 TABLET BY MOUTH EVERY DAY (Patient taking differently: Take 10 mg by mouth daily.)   pravastatin (PRAVACHOL) 10 MG tablet Take 1 tablet (10 mg total) by mouth daily.   senna-docusate (SENOKOT-S) 8.6-50 MG tablet Take 2 tablets by mouth at bedtime. For AFTER surgery, do not take if having diarrhea   traMADol (ULTRAM) 50 MG tablet Take 1 tablet (50 mg total) by mouth every 6 (six) hours as needed for severe pain. For AFTER surgery only, do not take or drive   [DISCONTINUED] traMADol (ULTRAM) 50 MG tablet Take 1 tablet (50 mg total) by mouth every 6 (six) hours as needed.   No facility-administered encounter medications on file as of 06/28/2022.    ALLERGIES:  Allergies  Allergen Reactions   Hydrocodone Hives   Oxycodone Hives   Rosuvastatin     Muscle aches     FAMILY HISTORY:  Family History  Problem Relation Age of Onset   Hypertension Mother    Stroke Mother 71   Hypertension Father    Diabetes Father    Hypertension Sister    Liver disease Sister        related to infected appendix   Diabetes Sister    Breast cancer Sister    Hypertension Sister    Diabetes Sister    Hypertension Brother    Diabetes Brother    Hypertension Daughter    Colon cancer Neg Hx    Ovarian cancer Neg Hx    Endometrial cancer Neg Hx    Pancreatic cancer Neg Hx    Prostate cancer Neg Hx      SOCIAL HISTORY:  Social Connections: Not on file    REVIEW OF SYSTEMS:  Denies appetite changes, fevers, chills, fatigue, unexplained weight changes. Denies hearing loss, neck lumps or masses, mouth sores, ringing in ears or voice changes. Denies cough or wheezing.  Denies shortness of breath. Denies chest pain or palpitations. Denies leg swelling. Denies abdominal distention, pain, blood in stools, constipation, diarrhea, nausea, vomiting, or early satiety. Denies pain with intercourse, dysuria, frequency, hematuria or incontinence. Denies hot flashes, pelvic pain, vaginal bleeding  or vaginal discharge.   Denies joint pain, back pain or muscle pain/cramps. Denies itching, rash, or wounds. Denies dizziness, headaches, numbness or seizures. Denies swollen lymph nodes or glands, denies easy bruising or bleeding. Denies anxiety, depression, confusion, or decreased concentration.  Physical Exam:  Vital Signs for this encounter:  Blood pressure (!) 126/49, pulse 80, temperature 98.3 F (36.8 C), temperature source Oral, resp. rate 16, height '5\' 3"'$  (1.6 m), weight 209 lb (94.8 kg), last  menstrual period 10/19/1998. Body mass index is 37.02 kg/m. General: Alert, oriented, no acute distress.  HEENT: Normocephalic, atraumatic. Sclera anicteric.  Chest: Clear to auscultation bilaterally. No wheezes, rhonchi, or rales. Cardiovascular: Regular rate and rhythm, no murmurs, rubs, or gallops.  Abdomen: Obese. Normoactive bowel sounds. Soft, nondistended, nontender to palpation. No masses or hepatosplenomegaly appreciated. No palpable fluid wave.  Well-healed laparoscopic incisions. Extremities: Grossly normal range of motion. Warm, well perfused. No edema bilaterally.  Skin: No rashes or lesions.  Lymphatics: No cervical, supraclavicular, or inguinal adenopathy.  GU:  Normal external female genitalia. No lesions. No discharge or bleeding.             Bladder/urethra:  No lesions or masses, well supported bladder             Vagina: Well rugated, no lesions or masses.             Cervix: Normal appearing, no lesions.             Uterus: Enlarged, approximately 14 cm with anterior fibroid palpated.  Somewhat mobile, fullness appreciated in the cul-de-sac that is smooth. No parametrial involvement or nodularity.             Adnexa: see above.  Rectal: Deferred.  LABORATORY AND RADIOLOGIC DATA:  Outside medical records were reviewed to synthesize the above history, along with the history and physical obtained during the visit.   Lab Results  Component Value Date   WBC 12.2 (H)  03/18/2022   HGB 13.6 06/07/2022   HCT 40.0 06/07/2022   PLT 393 03/18/2022   GLUCOSE 119 (H) 06/07/2022   CHOL 211 (H) 01/24/2022   TRIG 85.0 01/24/2022   HDL 56.20 01/24/2022   LDLCALC 138 (H) 01/24/2022   ALT 15 03/18/2022   AST 16 03/18/2022   NA 140 06/07/2022   K 3.4 (L) 06/07/2022   CL 100 06/07/2022   CREATININE 0.80 06/07/2022   BUN 18 06/07/2022   CO2 30 03/18/2022   TSH 0.83 10/27/2021   HGBA1C 6.4 01/24/2022   MICROALBUR 1.8 07/26/2021

## 2022-06-28 NOTE — Patient Instructions (Addendum)
Preparing for your Surgery  Plan for surgery on July 14, 2022 with Dr. Jeral Pinch at [place] will be scheduled for robotic assisted total laparoscopic hysterectomy (removal of the uterus and cervix), bilateral salpingo-oophorectomy (removal of both ovaries and fallopian tubes), sentinel lymph node biopsy, possible lymph node dissection, possible mini laparotomy and/or laparotomy (larger incision on your abdomen if needed).   Pre-operative Testing -You will receive a phone call from presurgical testing at [facility] to arrange for a pre-operative appointment and lab work.  -Bring your insurance card, copy of an advanced directive if applicable, medication list  -At that visit, you will be asked to sign a consent for a possible blood transfusion in case a transfusion becomes necessary during surgery.  The need for a blood transfusion is rare but having consent is a necessary part of your care.     -You should not be taking blood thinners or aspirin at least ten days prior to surgery unless instructed by your surgeon.  -Do not take supplements such as fish oil (omega 3), red yeast rice, turmeric before your surgery. You want to avoid medications with aspirin in them including headache powders such as BC or Goody's), Excedrin migraine.  Day Before Surgery at [place] will be asked to take in a light diet the day before surgery. You will be advised you can have clear liquids up until 3 hours before your surgery.    Eat a light diet the day before surgery.  Examples including soups, broths, toast, yogurt, mashed potatoes.  AVOID GAS PRODUCING FOODS. Things to avoid include carbonated beverages (fizzy beverages, sodas), raw fruits and raw vegetables (uncooked), or beans.   If your bowels are filled with gas, your surgeon will have difficulty visualizing your pelvic organs which increases your surgical risks.  Your role in recovery Your role is to become active as  soon as directed by your doctor, while still giving yourself time to heal.  Rest when you feel tired. You will be asked to do the following in order to speed your recovery:  - Cough and breathe deeply. This helps to clear and expand your lungs and can prevent pneumonia after surgery.  - [place]. Do mild physical activity. Walking or moving your legs help your circulation and body functions return to normal. Do not try to get up or walk alone the first time after surgery.   -If you develop swelling on one leg or the other, pain in the back of your leg, redness/warmth in one of your legs, please call the office or go to the Emergency Room to have a doppler to rule out a blood clot. For shortness of breath, chest pain-seek care in the Emergency Room as soon as possible. - Actively manage your pain. Managing your pain lets you move in comfort. We will ask you to rate your pain on a scale of zero to 10. It is your responsibility to tell your doctor or nurse where and how much you hurt so your pain can be treated.  Special Considerations -If you are diabetic, you may be placed on insulin after surgery to have closer control over your blood sugars to promote healing and recovery.  This does not mean that you will be discharged on insulin.  If applicable, your oral antidiabetics will be resumed when you are tolerating a solid diet.  -Your final pathology results from surgery should be available around one week after surgery and  the results will be relayed to you when available.  -FMLA forms can be faxed to [phone] and please allow 5-7 business days for completion.  Pain Management After Surgery -You have been prescribed your pain medication and bowel regimen medications before surgery so that you can have these available when you are discharged from the hospital. The pain medication is for use ONLY AFTER surgery and a new prescription will not be given.   -Make sure that you have  Tylenol IF YOU ARE ABLE TO TAKE THESE MEDICATION at home to use on a regular basis after surgery for pain control.   -Review the attached handout on narcotic use and their risks and side effects.   Bowel Regimen -You have been prescribed Sennakot-S to take nightly to prevent constipation especially if you are taking the narcotic pain medication intermittently.  It is important to prevent constipation and drink adequate amounts of liquids. You can stop taking this medication when you are not taking pain medication and you are back on your normal bowel routine.  Risks of Surgery Risks of surgery are low but include bleeding, infection, damage to surrounding structures, re-operation, blood clots, and very rarely death.   Blood Transfusion Information (For the consent to be signed before surgery)  We will be checking your blood type before surgery so in case of emergencies, we will know what type of blood you would need.                                            WHAT IS A BLOOD TRANSFUSION?  A transfusion is the replacement of blood or some of its parts. Blood is made up of multiple cells which provide different functions. Red blood cells carry oxygen and are used for blood loss replacement. White blood cells fight against infection. Platelets control bleeding. Plasma helps clot blood. Other blood products are available for specialized needs, such as hemophilia or other clotting disorders. BEFORE THE TRANSFUSION  Who gives blood for transfusions?  You may be able to donate blood to be used at a later date on yourself (autologous donation). Relatives can be asked to donate blood. This is generally not any safer than if you have received blood from a stranger. The same precautions are taken to ensure safety when a relative's blood is donated. Healthy volunteers who are fully evaluated to make sure their blood is safe. This is blood bank blood. Transfusion therapy is the safest it has ever been  in the practice of medicine. Before blood is taken from a donor, a complete history is taken to make sure that person has no history of diseases nor engages in risky social behavior (examples are intravenous drug use or sexual activity with multiple partners). The donor's travel history is screened to minimize risk of transmitting infections, such as malaria. The donated blood is tested for signs of infectious diseases, such as HIV and hepatitis. The blood is then tested to be sure it is compatible with you in order to minimize the chance of a transfusion reaction. If you or a relative donates blood, this is often done in anticipation of surgery and is not appropriate for emergency situations. It takes many days to process the donated blood. RISKS AND COMPLICATIONS Although transfusion therapy is very safe and saves many lives, the main dangers of transfusion include:  Getting an infectious disease. Developing a transfusion  reaction. This is an allergic reaction to something in the blood you were given. Every precaution is taken to prevent this. The decision to have a blood transfusion has been considered carefully by your caregiver before blood is given. Blood is not given unless the benefits outweigh the risks.  AFTER SURGERY INSTRUCTIONS  Return to work: 4-6 weeks if applicable  Activity: 1. Be up and out of the bed during the day.  Take a nap if needed.  You may walk up steps but be careful and use the hand rail.  Stair climbing will tire you more than you think, you may need to stop part way and rest.   2. No lifting or straining for 6 weeks over 10 pounds. No pushing, pulling, straining for 6 weeks.  3. No driving for around 1 week(s).  Do not drive if you are taking narcotic pain medicine and make sure that your reaction time has returned.   4. You can shower as soon as the next day after surgery. Shower daily.  Use your regular soap and water (not directly on the incision) and pat your  incision(s) dry afterwards; don't rub.  No tub baths or submerging your body in water until cleared by your surgeon. If you have the soap that was given to you by pre-surgical testing that was used before surgery, you do not need to use it afterwards because this can irritate your incisions.   5. No sexual activity and nothing in the vagina for 8-10 weeks.  6. You may experience a small amount of clear drainage from your incisions, which is normal.  If the drainage persists, increases, or changes color please call the office.  7. Do not use creams, lotions, or ointments such as neosporin on your incisions after surgery until advised by your surgeon because they can cause removal of the dermabond glue on your incisions.    8. You may experience vaginal spotting after surgery or around the 6-8 week mark from surgery when the stitches at the top of the vagina begin to dissolve.  The spotting is normal but if you experience heavy bleeding, call our office.  9. Take Tylenol first for pain if you are able to take these medications and only use narcotic pain medication for severe pain not relieved by the Tylenol.  Monitor your Tylenol intake to a max of 4,000 mg in a 24 hour period.   Diet: 1. Low sodium Heart Healthy Diet is recommended but you are cleared to resume your normal (before surgery) diet after your procedure.  2. It is safe to use a laxative, such as Miralax or Colace, if you have difficulty moving your bowels. You have been prescribed Sennakot-S to take at bedtime every evening after surgery to keep bowel movements regular and to prevent constipation.    Wound Care: 1. Keep clean and dry.  Shower daily.  Reasons to call the Doctor: Fever - Oral temperature greater than 100.4 degrees Fahrenheit Foul-smelling vaginal discharge Difficulty urinating Nausea and vomiting Increased pain at the site of the incision that is unrelieved with pain medicine. Difficulty breathing with or without  chest pain New calf pain especially if only on one side Sudden, continuing increased vaginal bleeding with or without clots.   Contacts: For questions or concerns you should contact:  Dr. Jeral Pinch at [phone]  Joylene John, NP at [phone]  After Hours: call [phone] and have the GYN Oncologist paged/contacted (after 5 pm or on the weekends).  Messages sent  via mychart are for non-urgent matters and are not responded to after hours so for urgent needs, please call the after hours number.

## 2022-06-29 ENCOUNTER — Other Ambulatory Visit: Payer: Self-pay | Admitting: Gynecologic Oncology

## 2022-06-29 DIAGNOSIS — N8502 Endometrial intraepithelial neoplasia [EIN]: Secondary | ICD-10-CM

## 2022-06-30 ENCOUNTER — Inpatient Hospital Stay: Payer: BC Managed Care – PPO | Admitting: Licensed Clinical Social Worker

## 2022-06-30 NOTE — Patient Instructions (Signed)
Preparing for your Surgery   Plan for surgery on July 14, 2022 with Dr. Jeral Pinch at [place] will be scheduled for robotic assisted total laparoscopic hysterectomy (removal of the uterus and cervix), bilateral salpingo-oophorectomy (removal of both ovaries and fallopian tubes), sentinel lymph node biopsy, possible lymph node dissection, possible mini laparotomy and/or laparotomy (larger incision on your abdomen if needed).    Pre-operative Testing -You will receive a phone call from presurgical testing at [facility] to arrange for a pre-operative appointment and lab work.   -Bring your insurance card, copy of an advanced directive if applicable, medication list   -At that visit, you will be asked to sign a consent for a possible blood transfusion in case a transfusion becomes necessary during surgery.  The need for a blood transfusion is rare but having consent is a necessary part of your care.      -You should not be taking blood thinners or aspirin at least ten days prior to surgery unless instructed by your surgeon.   -Do not take supplements such as fish oil (omega 3), red yeast rice, turmeric before your surgery. You want to avoid medications with aspirin in them including headache powders such as BC or Goody's), Excedrin migraine.   Day Before Surgery at [place] will be asked to take in a light diet the day before surgery. You will be advised you can have clear liquids up until 3 hours before your surgery.     Eat a light diet the day before surgery.  Examples including soups, broths, toast, yogurt, mashed potatoes.  AVOID GAS PRODUCING FOODS. Things to avoid include carbonated beverages (fizzy beverages, sodas), raw fruits and raw vegetables (uncooked), or beans.    If your bowels are filled with gas, your surgeon will have difficulty visualizing your pelvic organs which increases your surgical risks.   Your role in recovery Your role is to become  active as soon as directed by your doctor, while still giving yourself time to heal.  Rest when you feel tired. You will be asked to do the following in order to speed your recovery:   - Cough and breathe deeply. This helps to clear and expand your lungs and can prevent pneumonia after surgery.  - [place]. Do mild physical activity. Walking or moving your legs help your circulation and body functions return to normal. Do not try to get up or walk alone the first time after surgery.   -If you develop swelling on one leg or the other, pain in the back of your leg, redness/warmth in one of your legs, please call the office or go to the Emergency Room to have a doppler to rule out a blood clot. For shortness of breath, chest pain-seek care in the Emergency Room as soon as possible. - Actively manage your pain. Managing your pain lets you move in comfort. We will ask you to rate your pain on a scale of zero to 10. It is your responsibility to tell your doctor or nurse where and how much you hurt so your pain can be treated.   Special Considerations -If you are diabetic, you may be placed on insulin after surgery to have closer control over your blood sugars to promote healing and recovery.  This does not mean that you will be discharged on insulin.  If applicable, your oral antidiabetics will be resumed when you are tolerating a solid diet.   -Your final  pathology results from surgery should be available around one week after surgery and the results will be relayed to you when available.   -FMLA forms can be faxed to [phone] and please allow 5-7 business days for completion.   Pain Management After Surgery -You have been prescribed your pain medication and bowel regimen medications before surgery so that you can have these available when you are discharged from the hospital. The pain medication is for use ONLY AFTER surgery and a new prescription will not be given.    -Make  sure that you have Tylenol IF YOU ARE ABLE TO TAKE THESE MEDICATION at home to use on a regular basis after surgery for pain control.    -Review the attached handout on narcotic use and their risks and side effects.    Bowel Regimen -You have been prescribed Sennakot-S to take nightly to prevent constipation especially if you are taking the narcotic pain medication intermittently.  It is important to prevent constipation and drink adequate amounts of liquids. You can stop taking this medication when you are not taking pain medication and you are back on your normal bowel routine.   Risks of Surgery Risks of surgery are low but include bleeding, infection, damage to surrounding structures, re-operation, blood clots, and very rarely death.     Blood Transfusion Information (For the consent to be signed before surgery)   We will be checking your blood type before surgery so in case of emergencies, we will know what type of blood you would need.                                             WHAT IS A BLOOD TRANSFUSION?   A transfusion is the replacement of blood or some of its parts. Blood is made up of multiple cells which provide different functions. Red blood cells carry oxygen and are used for blood loss replacement. White blood cells fight against infection. Platelets control bleeding. Plasma helps clot blood. Other blood products are available for specialized needs, such as hemophilia or other clotting disorders. BEFORE THE TRANSFUSION  Who gives blood for transfusions?  You may be able to donate blood to be used at a later date on yourself (autologous donation). Relatives can be asked to donate blood. This is generally not any safer than if you have received blood from a stranger. The same precautions are taken to ensure safety when a relative's blood is donated. Healthy volunteers who are fully evaluated to make sure their blood is safe. This is blood bank blood. Transfusion therapy is  the safest it has ever been in the practice of medicine. Before blood is taken from a donor, a complete history is taken to make sure that person has no history of diseases nor engages in risky social behavior (examples are intravenous drug use or sexual activity with multiple partners). The donor's travel history is screened to minimize risk of transmitting infections, such as malaria. The donated blood is tested for signs of infectious diseases, such as HIV and hepatitis. The blood is then tested to be sure it is compatible with you in order to minimize the chance of a transfusion reaction. If you or a relative donates blood, this is often done in anticipation of surgery and is not appropriate for emergency situations. It takes many days to process the donated blood. RISKS AND COMPLICATIONS  Although transfusion therapy is very safe and saves many lives, the main dangers of transfusion include:  Getting an infectious disease. Developing a transfusion reaction. This is an allergic reaction to something in the blood you were given. Every precaution is taken to prevent this. The decision to have a blood transfusion has been considered carefully by your caregiver before blood is given. Blood is not given unless the benefits outweigh the risks.   AFTER SURGERY INSTRUCTIONS   Return to work: 4-6 weeks if applicable   Activity: 1. Be up and out of the bed during the day.  Take a nap if needed.  You may walk up steps but be careful and use the hand rail.  Stair climbing will tire you more than you think, you may need to stop part way and rest.    2. No lifting or straining for 6 weeks over 10 pounds. No pushing, pulling, straining for 6 weeks.   3. No driving for around 1 week(s).  Do not drive if you are taking narcotic pain medicine and make sure that your reaction time has returned.    4. You can shower as soon as the next day after surgery. Shower daily.  Use your regular soap and water (not directly  on the incision) and pat your incision(s) dry afterwards; don't rub.  No tub baths or submerging your body in water until cleared by your surgeon. If you have the soap that was given to you by pre-surgical testing that was used before surgery, you do not need to use it afterwards because this can irritate your incisions.    5. No sexual activity and nothing in the vagina for 8-10 weeks.   6. You may experience a small amount of clear drainage from your incisions, which is normal.  If the drainage persists, increases, or changes color please call the office.   7. Do not use creams, lotions, or ointments such as neosporin on your incisions after surgery until advised by your surgeon because they can cause removal of the dermabond glue on your incisions.     8. You may experience vaginal spotting after surgery or around the 6-8 week mark from surgery when the stitches at the top of the vagina begin to dissolve.  The spotting is normal but if you experience heavy bleeding, call our office.   9. Take Tylenol first for pain if you are able to take these medications and only use narcotic pain medication for severe pain not relieved by the Tylenol.  Monitor your Tylenol intake to a max of 4,000 mg in a 24 hour period.    Diet: 1. Low sodium Heart Healthy Diet is recommended but you are cleared to resume your normal (before surgery) diet after your procedure.   2. It is safe to use a laxative, such as Miralax or Colace, if you have difficulty moving your bowels. You have been prescribed Sennakot-S to take at bedtime every evening after surgery to keep bowel movements regular and to prevent constipation.     Wound Care: 1. Keep clean and dry.  Shower daily.   Reasons to call the Doctor: Fever - Oral temperature greater than 100.4 degrees Fahrenheit Foul-smelling vaginal discharge Difficulty urinating Nausea and vomiting Increased pain at the site of the incision that is unrelieved with pain  medicine. Difficulty breathing with or without chest pain New calf pain especially if only on one side Sudden, continuing increased vaginal bleeding with or without clots.   Contacts: For  questions or concerns you should contact:   Dr. Jeral Pinch at [phone]   Joylene John, NP at [phone]   After Hours: call [phone] and have the GYN Oncologist paged/contacted (after 5 pm or on the weekends).   Messages sent via mychart are for non-urgent matters and are not responded to after hours so for urgent needs, please call the after hours number.

## 2022-06-30 NOTE — Progress Notes (Signed)
Patient here for new patient consultation with Dr. Jeral Pinch and for a pre-operative appointment prior to her scheduled surgery on July 14, 2022. She is scheduled for robotic assisted total laparoscopic hysterectomy, bilateral salpingo-oophorectomy, sentinel lymph node biopsy, possible lymph node dissection, possible mini laparotomy and/or laparotomy. The surgery was discussed in detail.  See after visit summary for additional details. Visual aids used to discuss items related to surgery including sequential compression stockings, foley catheter, IV pump, multi-modal pain regimen including tylenol, photo of the surgical robot, female reproductive system to discuss surgery in detail.      Discussed post-op pain management in detail including the aspects of the enhanced recovery pathway.  Advised her that a new prescription would be sent in for tramadol and it is only to be used for after her upcoming surgery.  We discussed the use of tylenol post-op and to monitor for a maximum of 4,000 mg in a 24 hour period.  Also prescribed sennakot to be used after surgery and to hold if having loose stools.  Discussed bowel regimen in detail.     Discussed the use of SCDs and measures to take at home to prevent DVT including frequent mobility.  Reportable signs and symptoms of DVT discussed. Post-operative instructions discussed and expectations for after surgery. Incisional care discussed as well including reportable signs and symptoms including erythema, drainage, wound separation.     10 minutes spent with the patient.  Verbalizing understanding of material discussed. No needs or concerns voiced at the end of the visit.   Advised patient to call for any needs.  Advised that her post-operative medications had been prescribed and could be picked up at any time.    This appointment is included in the global surgical bundle as pre-operative teaching and has no charge.

## 2022-06-30 NOTE — Progress Notes (Signed)
[place] Work  Clinical Social Work was referred by self for assessment of psychosocial needs.  Clinical Social Worker  received TC from pt  inquiring about types of assistance. Pt reports that she is on Section 13 as she has been unable to work with the series of health issues and now upcoming surgery. 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 Due to section 13 filing, she is unable to access some resources (ex: [employer], etc). CSW shared information on cancer foundations that may be able to provide some assistance ([employer] and [employer]). Pt stated that she will call both organizations to apply.     [place], [place] Worker [employer]

## 2022-07-04 ENCOUNTER — Telehealth: Payer: Self-pay | Admitting: *Deleted

## 2022-07-04 NOTE — Telephone Encounter (Signed)
Fax FMLA paperwork  

## 2022-07-07 ENCOUNTER — Other Ambulatory Visit (HOSPITAL_COMMUNITY): Payer: BC Managed Care – PPO

## 2022-07-07 NOTE — Patient Instructions (Signed)
DUE TO COVID-19 ONLY TWO VISITORS  (aged 59 and older)  ARE ALLOWED TO COME WITH YOU AND STAY IN THE WAITING ROOM ONLY DURING PRE OP AND PROCEDURE.   **NO VISITORS ARE ALLOWED IN THE SHORT STAY AREA OR RECOVERY ROOM!!**  IF YOU WILL BE ADMITTED INTO THE HOSPITAL YOU ARE ALLOWED ONLY FOUR SUPPORT PEOPLE DURING VISITATION HOURS ONLY (7 AM -8PM)   The support person(s) must pass our screening, gel in and out, and wear a mask at all times, including in the patient's room. Patients must also wear a mask when staff or their support person are in the room. Visitors GUEST BADGE MUST BE WORN VISIBLY  One adult visitor may remain with you overnight and MUST be in the room by 8 P.M.     Your procedure is scheduled on: 07/14/22   Report to [facility] Main Entrance    Report to admitting at  5:30 AM   Call this number if you have problems the morning of surgery [phone]   Light Diet- Full liquid diet the day before surgery  Strained creamy soups Tea, Coffee- with cream or mild and sugar or honey  Juices- cranberry , grape and apple  Jello  Milkshakes  Pudding , custards  Popsicles  Water Plain ice cream f, frozen yogurt, sherbet, plain yogurt  Fruit ices and popsicles with no fruit pulp  Sugar, honey and syrups Clear broths  Boost, Ensure, Resource and other liquid supplements NO CARBONATED BEVERAGES     Do not eat food :After Midnight. Change to clear liquid   After Midnight you may have the following liquids until _4:30 _____ AM/  DAY OF SURGERY  Water Black Coffee (sugar ok, NO MILK/CREAM OR CREAMERS)  Tea (sugar ok, NO MILK/CREAM OR CREAMERS) regular and decaf                             Plain Jell-O (NO RED)                                           Fruit ices (not with fruit pulp, NO RED)                                     Popsicles (NO RED)                                                                  Juice: apple, WHITE grape, WHITE cranberry Sports drinks  like Gatorade (NO RED)                        If you have questions, please contact your surgeon's office.   FOLLOW BOWEL PREP AND ANY ADDITIONAL PRE OP INSTRUCTIONS YOU RECEIVED FROM YOUR SURGEON'S OFFICE!!!     Oral Hygiene is also important to reduce your risk of infection.  Remember - BRUSH YOUR TEETH THE MORNING OF SURGERY WITH YOUR REGULAR TOOTHPASTE   Do NOT smoke after Midnight   Take these medicines the morning of surgery with A SIP OF WATER: Pravastatin  Bring CPAP mask and tubing day of surgery.                              You may not have any metal on your body including hair pins, jewelry, and body piercing             Do not wear make-up, lotions, powders, perfumes/cologne, or deodorant  Do not wear nail polish including gel and S&S, artificial/acrylic nails, or any other type of covering on natural nails including finger and toenails. If you have artificial nails, gel coating, etc. that needs to be removed by a nail salon please have this removed prior to surgery or surgery may need to be canceled/ delayed if the surgeon/ anesthesia feels like they are unable to be safely monitored.   Do not shave  48 hours prior to surgery.     Do not bring valuables to the hospital. [place].   Contacts, dentures or bridgework may not be worn into surgery.   Bring small overnight bag day of surgery.   DO NOT [place]. PHARMACY WILL DISPENSE MEDICATIONS LISTED ON YOUR MEDICATION LIST TO YOU DURING YOUR ADMISSION [place]!    Patients discharged on the day of surgery will not be allowed to drive home.  Someone NEEDS to stay with you for the first 24 hours after anesthesia.   Special Instructions: Bring a copy of your healthcare power of attorney and living will documents the day of surgery if you haven't scanned them before.              Please read  over the following fact sheets you were given: IF YOU HAVE QUESTIONS ABOUT YOUR PRE-OP INSTRUCTIONS PLEASE CALL [phone]     [facility] - Preparing for Surgery Before surgery, you can play an important role.  Because skin is not sterile, your skin needs to be as free of germs as possible.  You can reduce the number of germs on your skin by washing with CHG (chlorahexidine gluconate) soap before surgery.  CHG is an antiseptic cleaner which kills germs and bonds with the skin to continue killing germs even after washing. Please DO NOT use if you have an allergy to CHG or antibacterial soaps.  If your skin becomes reddened/irritated stop using the CHG and inform your nurse when you arrive at Short Stay. Do not shave (including legs and underarms) for at least 48 hours prior to the first CHG shower.  Please follow these instructions carefully:  1.  Shower with CHG Soap the night before surgery and the  morning of Surgery.  2.  If you choose to wash your hair, wash your hair first as usual with your  normal  shampoo.  3.  After you shampoo, rinse your hair and body thoroughly to remove the  shampoo.                           4.  Use CHG as you would any other liquid soap.  You can apply chg directly  to  the skin and wash                       Gently with a scrungie or clean washcloth.  5.  Apply the CHG Soap to your body ONLY FROM THE NECK DOWN.   Do not use on face/ open                           Wound or open sores. Avoid contact with eyes, ears mouth and genitals (private parts).                       Wash face,  Genitals (private parts) with your normal soap.             6.  Wash thoroughly, paying special attention to the area where your surgery  will be performed.  7.  Thoroughly rinse your body with warm water from the neck down.  8.  DO NOT shower/wash with your normal soap after using and rinsing off  the CHG Soap.                9.  Pat yourself dry with a clean towel.            10.   Wear clean pajamas.            11.  Place clean sheets on your bed the night of your first shower and do not  sleep with pets. Day of Surgery : Do not apply any lotions/deodorants the morning of surgery.  Please wear clean clothes to the hospital/surgery center.  FAILURE TO FOLLOW THESE INSTRUCTIONS MAY RESULT IN THE CANCELLATION OF YOUR SURGERY   ________________________________________________________________________

## 2022-07-08 ENCOUNTER — Other Ambulatory Visit: Payer: Self-pay

## 2022-07-08 ENCOUNTER — Encounter (HOSPITAL_COMMUNITY): Payer: Self-pay

## 2022-07-08 ENCOUNTER — Telehealth: Payer: Self-pay | Admitting: Surgery

## 2022-07-08 ENCOUNTER — Encounter (HOSPITAL_COMMUNITY)
Admission: RE | Admit: 2022-07-08 | Discharge: 2022-07-08 | Disposition: A | Discharge status: Home / Self Care | Payer: BC Managed Care – PPO | Source: Ambulatory Visit | Attending: Gynecologic Oncology | Admitting: Gynecologic Oncology

## 2022-07-08 DIAGNOSIS — N8502 Endometrial intraepithelial neoplasia [EIN]: Secondary | ICD-10-CM | POA: Insufficient documentation

## 2022-07-08 DIAGNOSIS — E1169 Type 2 diabetes mellitus with other specified complication: Secondary | ICD-10-CM

## 2022-07-08 DIAGNOSIS — Z01818 Encounter for other preprocedural examination: Secondary | ICD-10-CM | POA: Insufficient documentation

## 2022-07-08 HISTORY — DX: Hyperlipidemia, unspecified: E78.5

## 2022-07-08 LAB — CBC
HCT: 37.5 % (ref 36.0–46.0)
Hemoglobin: 12.1 g/dL (ref 12.0–15.0)
MCH: 28.6 pg (ref 26.0–34.0)
MCHC: 32.3 g/dL (ref 30.0–36.0)
MCV: 88.7 fL (ref 80.0–100.0)
Platelets: 406 10*3/uL — ABNORMAL HIGH (ref 150–400)
RBC: 4.23 MIL/uL (ref 3.87–5.11)
RDW: 14.3 % (ref 11.5–15.5)
WBC: 7.9 10*3/uL (ref 4.0–10.5)
nRBC: 0 % (ref 0.0–0.2)

## 2022-07-08 LAB — COMPREHENSIVE METABOLIC PANEL
ALT: 17 U/L (ref 0–44)
AST: 21 U/L (ref 15–41)
Albumin: 3.8 g/dL (ref 3.5–5.0)
Alkaline Phosphatase: 66 U/L (ref 38–126)
Anion gap: 9 (ref 5–15)
BUN: 14 mg/dL (ref 6–20)
CO2: 26 mmol/L (ref 22–32)
Calcium: 9 mg/dL (ref 8.9–10.3)
Chloride: 102 mmol/L (ref 98–111)
Creatinine, Ser: 0.73 mg/dL (ref 0.44–1.00)
GFR, Estimated: >60 mL/min (ref >60)
Glucose, Bld: 163 mg/dL — ABNORMAL HIGH (ref 70–99)
Potassium: 3.3 mmol/L — ABNORMAL LOW (ref 3.5–5.1)
Sodium: 137 mmol/L (ref 135–145)
Total Bilirubin: 0.5 mg/dL (ref 0.3–1.2)
Total Protein: 7.2 g/dL (ref 6.5–8.1)

## 2022-07-08 LAB — GLUCOSE, CAPILLARY: Glucose-Capillary: 144 mg/dL — ABNORMAL HIGH (ref 70–99)

## 2022-07-08 LAB — HEMOGLOBIN A1C
Hgb A1c MFr Bld: 6.2 % — ABNORMAL HIGH (ref 4.8–5.6)
Mean Plasma Glucose: 131.24 mg/dL

## 2022-07-08 NOTE — Telephone Encounter (Addendum)
Called patient to let them know below information. LVM.  ----- Message from Dorothyann Gibbs, NP sent at 07/08/2022 11:25 AM EDT ----- Can you please let this patient know her potassium level is slightly low? She can increase her intake of potassium rich foods but do not want her to go overboard with this and then have high K+ in return.   Thanks Melissa  ----- Message ----- From: Buel Ream, [facility] [place] Sent: 07/08/2022  10:34 AM EDT To: Dorothyann Gibbs, NP

## 2022-07-08 NOTE — Progress Notes (Signed)
Anesthesia note:  Bowel prep reminder:NA  PCP - Dr. B. [place] Cardiologist -none Other-   Chest x-ray - no EKG - 06/07/22-epic Stress Test - no ECHO - no Cardiac Cath - no  Pacemaker/ICD device last checked:NA  Sleep Study - no CPAP -   Pt is pre diabetic-yes CBG at PAT was 144 Fasting Blood Sugar -  Checks Blood Sugar _____  Blood Thinner:NA Blood Thinner Instructions: Aspirin Instructions: Last Dose:  Anesthesia review: no  Patient denies shortness of breath, fever, cough and chest pain at PAT appointment Pt reports no SOB with activities.  Patient verbalized understanding of instructions that were given to them at the PAT appointment. Patient was also instructed that they will need to review over the PAT instructions again at home before surgery. yes

## 2022-07-11 NOTE — Telephone Encounter (Signed)
Called patient again and left instructions for eating potassium rich foods in voicemail. Callback number provided.

## 2022-07-13 ENCOUNTER — Telehealth: Payer: Self-pay | Admitting: Surgery

## 2022-07-13 NOTE — Telephone Encounter (Signed)
Called patient for preop. LVM requesting callback.

## 2022-07-13 NOTE — Anesthesia Preprocedure Evaluation (Signed)
Anesthesia Evaluation  Patient identified by MRN, date of birth, ID band Patient awake    Reviewed: Allergy & Precautions, NPO status , Patient's Chart, lab work & pertinent test results  History of Anesthesia Complications Negative for: history of anesthetic complications  Airway Mallampati: II  TM Distance: >3 FB Neck ROM: Full    Dental  (+) Missing,    Pulmonary neg pulmonary ROS,    Pulmonary exam normal        Cardiovascular hypertension, Pt. on medications Normal cardiovascular exam     Neuro/Psych Anxiety negative neurological ROS     GI/Hepatic negative GI ROS, Neg liver ROS,   Endo/Other  diabetes, Type 2  Renal/GU negative Renal ROS  negative genitourinary   Musculoskeletal  (+) Arthritis ,   Abdominal   Peds  Hematology negative hematology ROS (+)   Anesthesia Other Findings Hodgkin disease 1990s  Reproductive/Obstetrics negative OB ROS                            Anesthesia Physical Anesthesia Plan  ASA: 2  Anesthesia Plan: General   Post-op Pain Management: Tylenol PO (pre-op)*, Ketamine IV*, Celebrex PO (pre-op)* and Gabapentin PO (pre-op)*   Induction: Intravenous  PONV Risk Score and Plan: 4 or greater and Treatment may vary due to age or medical condition, Ondansetron, Dexamethasone, Midazolam, Scopolamine patch - Pre-op and Propofol infusion  Airway Management Planned: Oral ETT  Additional Equipment: None  Intra-op Plan:   Post-operative Plan: Extubation in OR  Informed Consent: I have reviewed the patients History and Physical, chart, labs and discussed the procedure including the risks, benefits and alternatives for the proposed anesthesia with the patient or authorized representative who has indicated his/her understanding and acceptance.     Dental advisory given  Plan Discussed with: CRNA  Anesthesia Plan Comments:        Anesthesia Quick  Evaluation

## 2022-07-13 NOTE — Telephone Encounter (Signed)
Telephone call to check on pre-operative status.  Patient compliant with pre-operative instructions.  Reinforced nothing to eat after midnight. Clear liquids until 4:15am. Patient to arrive at 5am.  No questions or concerns voiced.  Instructed to call for any needs.

## 2022-07-14 ENCOUNTER — Ambulatory Visit (HOSPITAL_COMMUNITY): Payer: BC Managed Care – PPO | Admitting: Physician Assistant

## 2022-07-14 ENCOUNTER — Other Ambulatory Visit: Payer: Self-pay

## 2022-07-14 ENCOUNTER — Encounter (HOSPITAL_COMMUNITY): Payer: Self-pay | Admitting: Gynecologic Oncology

## 2022-07-14 ENCOUNTER — Ambulatory Visit (HOSPITAL_COMMUNITY): Payer: BC Managed Care – PPO | Admitting: Certified Registered"

## 2022-07-14 ENCOUNTER — Encounter (HOSPITAL_COMMUNITY)
Admission: RE | Disposition: A | Discharge status: Home / Self Care | Payer: Self-pay | Source: Ambulatory Visit | Attending: Gynecologic Oncology

## 2022-07-14 ENCOUNTER — Ambulatory Visit (HOSPITAL_COMMUNITY)
Admission: RE | Admit: 2022-07-14 | Discharge: 2022-07-14 | Disposition: A | Discharge status: Home / Self Care | Payer: BC Managed Care – PPO | Source: Ambulatory Visit | Attending: Gynecologic Oncology | Admitting: Gynecologic Oncology

## 2022-07-14 DIAGNOSIS — N840 Polyp of corpus uteri: Secondary | ICD-10-CM | POA: Insufficient documentation

## 2022-07-14 DIAGNOSIS — N7011 Chronic salpingitis: Secondary | ICD-10-CM | POA: Diagnosis not present

## 2022-07-14 DIAGNOSIS — N7012 Chronic oophoritis: Secondary | ICD-10-CM | POA: Diagnosis not present

## 2022-07-14 DIAGNOSIS — E119 Type 2 diabetes mellitus without complications: Secondary | ICD-10-CM | POA: Diagnosis not present

## 2022-07-14 DIAGNOSIS — D251 Intramural leiomyoma of uterus: Secondary | ICD-10-CM | POA: Diagnosis not present

## 2022-07-14 DIAGNOSIS — E1169 Type 2 diabetes mellitus with other specified complication: Secondary | ICD-10-CM

## 2022-07-14 DIAGNOSIS — N838 Other noninflammatory disorders of ovary, fallopian tube and broad ligament: Secondary | ICD-10-CM | POA: Diagnosis not present

## 2022-07-14 DIAGNOSIS — N8501 Benign endometrial hyperplasia: Secondary | ICD-10-CM | POA: Diagnosis not present

## 2022-07-14 DIAGNOSIS — Z8571 Personal history of Hodgkin lymphoma: Secondary | ICD-10-CM | POA: Insufficient documentation

## 2022-07-14 DIAGNOSIS — Z780 Asymptomatic menopausal state: Secondary | ICD-10-CM | POA: Insufficient documentation

## 2022-07-14 DIAGNOSIS — N72 Inflammatory disease of cervix uteri: Secondary | ICD-10-CM | POA: Insufficient documentation

## 2022-07-14 DIAGNOSIS — D259 Leiomyoma of uterus, unspecified: Secondary | ICD-10-CM | POA: Diagnosis not present

## 2022-07-14 DIAGNOSIS — I10 Essential (primary) hypertension: Secondary | ICD-10-CM | POA: Insufficient documentation

## 2022-07-14 DIAGNOSIS — N736 Female pelvic peritoneal adhesions (postinfective): Secondary | ICD-10-CM | POA: Diagnosis not present

## 2022-07-14 DIAGNOSIS — N8502 Endometrial intraepithelial neoplasia [EIN]: Secondary | ICD-10-CM | POA: Diagnosis not present

## 2022-07-14 HISTORY — PX: SENTINEL NODE BIOPSY: SHX6608

## 2022-07-14 HISTORY — PX: ROBOTIC ASSISTED TOTAL HYSTERECTOMY WITH BILATERAL SALPINGO OOPHERECTOMY: SHX6086

## 2022-07-14 LAB — TYPE AND SCREEN
ABO/RH(D): A POS
Antibody Screen: NEGATIVE

## 2022-07-14 LAB — ABO/RH: ABO/RH(D): A POS

## 2022-07-14 SURGERY — HYSTERECTOMY, TOTAL, ROBOT-ASSISTED, LAPAROSCOPIC, WITH BILATERAL SALPINGO-OOPHORECTOMY
Anesthesia: General

## 2022-07-14 MED ORDER — ACETAMINOPHEN 500 MG PO TABS: 1000.0000 mg | ORAL_TABLET | Freq: Once | ORAL | Status: DC

## 2022-07-14 MED ORDER — ONDANSETRON HCL 4 MG/2ML IJ SOLN
4.0000 mg | Freq: Four times a day (QID) | INTRAMUSCULAR | Status: DC | PRN
  Administered 2022-07-14: 4 mg via INTRAVENOUS

## 2022-07-14 MED ORDER — MIDAZOLAM HCL 2 MG/2ML IJ SOLN
INTRAMUSCULAR | Status: DC | PRN
  Administered 2022-07-14: 2 mg via INTRAVENOUS

## 2022-07-14 MED ORDER — STERILE WATER FOR IRRIGATION IR SOLN
Status: DC | PRN
  Administered 2022-07-14: 1000 mL

## 2022-07-14 MED ORDER — GABAPENTIN 300 MG PO CAPS
300.0000 mg | ORAL_CAPSULE | ORAL | Status: AC
  Administered 2022-07-14: 300 mg via ORAL
  Filled 2022-07-14: qty 1

## 2022-07-14 MED ORDER — BUPIVACAINE HCL 0.25 % IJ SOLN
INTRAMUSCULAR | Status: DC | PRN
  Administered 2022-07-14: 27 mL

## 2022-07-14 MED ORDER — FENTANYL CITRATE (PF) 100 MCG/2ML IJ SOLN
INTRAMUSCULAR | Status: DC | PRN
  Administered 2022-07-14 (×3): 50 ug via INTRAVENOUS
  Administered 2022-07-14: 100 ug via INTRAVENOUS
  Administered 2022-07-14: 50 ug via INTRAVENOUS

## 2022-07-14 MED ORDER — KETAMINE HCL 10 MG/ML IJ SOLN
INTRAMUSCULAR | Status: DC | PRN
  Administered 2022-07-14: 25 mg via INTRAVENOUS

## 2022-07-14 MED ORDER — DEXAMETHASONE SODIUM PHOSPHATE 4 MG/ML IJ SOLN
4.0000 mg | INTRAMUSCULAR | Status: AC
  Administered 2022-07-14: 4 mg via INTRAVENOUS

## 2022-07-14 MED ORDER — HEPARIN SODIUM (PORCINE) 5000 UNIT/ML IJ SOLN
5000.0000 [IU] | INTRAMUSCULAR | Status: AC
  Administered 2022-07-14: 5000 [IU] via SUBCUTANEOUS
  Filled 2022-07-14: qty 1

## 2022-07-14 MED ORDER — MIDAZOLAM HCL 2 MG/2ML IJ SOLN
INTRAMUSCULAR | Status: AC
  Filled 2022-07-14: qty 2

## 2022-07-14 MED ORDER — PHENYLEPHRINE 80 MCG/ML (10ML) SYRINGE FOR IV PUSH (FOR BLOOD PRESSURE SUPPORT)
PREFILLED_SYRINGE | INTRAVENOUS | Status: AC
  Filled 2022-07-14: qty 10

## 2022-07-14 MED ORDER — HYDROMORPHONE HCL 1 MG/ML IJ SOLN
INTRAMUSCULAR | Status: AC
  Filled 2022-07-14: qty 1

## 2022-07-14 MED ORDER — PHENYLEPHRINE 80 MCG/ML (10ML) SYRINGE FOR IV PUSH (FOR BLOOD PRESSURE SUPPORT)
PREFILLED_SYRINGE | INTRAVENOUS | Status: DC | PRN
  Administered 2022-07-14 (×2): 80 ug via INTRAVENOUS

## 2022-07-14 MED ORDER — BUPIVACAINE LIPOSOME 1.3 % IJ SUSP
INTRAMUSCULAR | Status: DC | PRN
  Administered 2022-07-14: 20 mL

## 2022-07-14 MED ORDER — ROCURONIUM BROMIDE 10 MG/ML (PF) SYRINGE
PREFILLED_SYRINGE | INTRAVENOUS | Status: DC | PRN
  Administered 2022-07-14 (×2): 20 mg via INTRAVENOUS
  Administered 2022-07-14: 70 mg via INTRAVENOUS
  Administered 2022-07-14 (×2): 20 mg via INTRAVENOUS

## 2022-07-14 MED ORDER — FENTANYL CITRATE (PF) 100 MCG/2ML IJ SOLN
INTRAMUSCULAR | Status: AC
  Filled 2022-07-14: qty 2

## 2022-07-14 MED ORDER — DROPERIDOL 2.5 MG/ML IJ SOLN: 0.6250 mg | Freq: Once | INTRAMUSCULAR | Status: DC | PRN

## 2022-07-14 MED ORDER — CHLORHEXIDINE GLUCONATE 0.12 % MT SOLN
15.0000 mL | Freq: Once | OROMUCOSAL | Status: AC
  Administered 2022-07-14: 15 mL via OROMUCOSAL

## 2022-07-14 MED ORDER — CEFAZOLIN SODIUM-DEXTROSE 2-4 GM/100ML-% IV SOLN
2.0000 g | INTRAVENOUS | Status: AC
  Administered 2022-07-14: 2 g via INTRAVENOUS
  Filled 2022-07-14: qty 100

## 2022-07-14 MED ORDER — ONDANSETRON HCL 4 MG PO TABS: 4.0000 mg | ORAL_TABLET | Freq: Four times a day (QID) | ORAL | Status: DC | PRN

## 2022-07-14 MED ORDER — BUPIVACAINE LIPOSOME 1.3 % IJ SUSP
INTRAMUSCULAR | Status: AC
  Filled 2022-07-14: qty 20

## 2022-07-14 MED ORDER — ONDANSETRON HCL 4 MG/2ML IJ SOLN
INTRAMUSCULAR | Status: DC | PRN
  Administered 2022-07-14: 4 mg via INTRAVENOUS

## 2022-07-14 MED ORDER — LACTATED RINGERS IV SOLN: INTRAVENOUS | Status: DC | PRN

## 2022-07-14 MED ORDER — CELECOXIB 200 MG PO CAPS
200.0000 mg | ORAL_CAPSULE | ORAL | Status: AC
  Administered 2022-07-14: 200 mg via ORAL
  Filled 2022-07-14: qty 1

## 2022-07-14 MED ORDER — PROPOFOL 500 MG/50ML IV EMUL
INTRAVENOUS | Status: DC | PRN
  Administered 2022-07-14: 25 ug/kg/min via INTRAVENOUS

## 2022-07-14 MED ORDER — KETAMINE HCL 50 MG/5ML IJ SOSY
PREFILLED_SYRINGE | INTRAMUSCULAR | Status: AC
  Filled 2022-07-14: qty 5

## 2022-07-14 MED ORDER — LIDOCAINE 2% (20 MG/ML) 5 ML SYRINGE
INTRAMUSCULAR | Status: DC | PRN
  Administered 2022-07-14: 100 mg via INTRAVENOUS

## 2022-07-14 MED ORDER — LIDOCAINE HCL (PF) 2 % IJ SOLN
INTRAMUSCULAR | Status: AC
  Filled 2022-07-14: qty 5

## 2022-07-14 MED ORDER — INDOCYANINE GREEN 25 MG IV SOLR
INTRAVENOUS | Status: DC | PRN
  Administered 2022-07-14: 2.5 mg

## 2022-07-14 MED ORDER — SCOPOLAMINE 1 MG/3DAYS TD PT72: 1.0000 | MEDICATED_PATCH | Freq: Once | TRANSDERMAL | Status: DC

## 2022-07-14 MED ORDER — PROPOFOL 10 MG/ML IV BOLUS
INTRAVENOUS | Status: AC
  Filled 2022-07-14: qty 20

## 2022-07-14 MED ORDER — PHENYLEPHRINE HCL-NACL 20-0.9 MG/250ML-% IV SOLN
INTRAVENOUS | Status: DC | PRN
  Administered 2022-07-14: 20 ug/min via INTRAVENOUS

## 2022-07-14 MED ORDER — ACETAMINOPHEN 500 MG PO TABS
1000.0000 mg | ORAL_TABLET | ORAL | Status: AC
  Administered 2022-07-14: 1000 mg via ORAL
  Filled 2022-07-14: qty 2

## 2022-07-14 MED ORDER — ONDANSETRON HCL 4 MG/2ML IJ SOLN
INTRAMUSCULAR | Status: AC
  Filled 2022-07-14: qty 2

## 2022-07-14 MED ORDER — SCOPOLAMINE 1 MG/3DAYS TD PT72
1.0000 | MEDICATED_PATCH | TRANSDERMAL | Status: DC
  Administered 2022-07-14: 1.5 mg via TRANSDERMAL
  Filled 2022-07-14: qty 1

## 2022-07-14 MED ORDER — PROPOFOL 10 MG/ML IV BOLUS
INTRAVENOUS | Status: DC | PRN
  Administered 2022-07-14: 40 mg via INTRAVENOUS
  Administered 2022-07-14: 80 mg via INTRAVENOUS
  Administered 2022-07-14: 20 mg via INTRAVENOUS
  Administered 2022-07-14: 50 mg via INTRAVENOUS
  Administered 2022-07-14: 160 mg via INTRAVENOUS

## 2022-07-14 MED ORDER — ROCURONIUM BROMIDE 10 MG/ML (PF) SYRINGE
PREFILLED_SYRINGE | INTRAVENOUS | Status: AC
  Filled 2022-07-14: qty 10

## 2022-07-14 MED ORDER — STERILE WATER FOR INJECTION IJ SOLN
INTRAMUSCULAR | Status: DC | PRN
  Administered 2022-07-14: 10 mL

## 2022-07-14 MED ORDER — DEXAMETHASONE SODIUM PHOSPHATE 10 MG/ML IJ SOLN
INTRAMUSCULAR | Status: AC
  Filled 2022-07-14: qty 1

## 2022-07-14 MED ORDER — BUPIVACAINE HCL 0.25 % IJ SOLN
INTRAMUSCULAR | Status: AC
  Filled 2022-07-14: qty 1

## 2022-07-14 MED ORDER — HYDROMORPHONE HCL 1 MG/ML IJ SOLN
0.2500 mg | INTRAMUSCULAR | Status: DC | PRN
  Administered 2022-07-14 (×3): 0.5 mg via INTRAVENOUS

## 2022-07-14 MED ORDER — SUGAMMADEX SODIUM 200 MG/2ML IV SOLN
INTRAVENOUS | Status: DC | PRN
  Administered 2022-07-14: 200 mg via INTRAVENOUS

## 2022-07-14 MED ORDER — STERILE WATER FOR INJECTION IJ SOLN
INTRAMUSCULAR | Status: AC
  Filled 2022-07-14: qty 10

## 2022-07-14 MED ORDER — LACTATED RINGERS IV SOLN: INTRAVENOUS | Status: DC

## 2022-07-14 MED ORDER — ORAL CARE MOUTH RINSE: 15.0000 mL | Freq: Once | OROMUCOSAL | Status: AC

## 2022-07-14 MED ORDER — LACTATED RINGERS IR SOLN
Status: DC | PRN
  Administered 2022-07-14: 1000 mL

## 2022-07-14 SURGICAL SUPPLY — 76 items
ADH SKN CLS APL DERMABOND .7 (GAUZE/BANDAGES/DRESSINGS) ×2
AGENT HMST KT MTR STRL THRMB (HEMOSTASIS) ×2
APL ESCP 34 STRL LF DISP (HEMOSTASIS) ×2
APPLICATOR SURGIFLO ENDO (HEMOSTASIS) IMPLANT
BAG LAPAROSCOPIC 12 15 PORT 16 (BASKET) IMPLANT
BAG RETRIEVAL 12/15 (BASKET) ×2
BLADE SURG SZ10 CARB STEEL (BLADE) IMPLANT
COVER BACK TABLE 60X90IN (DRAPES) ×3 IMPLANT
COVER TIP SHEARS 8 DVNC (MISCELLANEOUS) ×3 IMPLANT
COVER TIP SHEARS 8MM DA VINCI (MISCELLANEOUS) ×2
DERMABOND ADVANCED .7 DNX12 (GAUZE/BANDAGES/DRESSINGS) ×3 IMPLANT
DRAPE ARM DVNC X/XI (DISPOSABLE) ×12 IMPLANT
DRAPE COLUMN DVNC XI (DISPOSABLE) ×3 IMPLANT
DRAPE DA VINCI XI ARM (DISPOSABLE) ×8
DRAPE DA VINCI XI COLUMN (DISPOSABLE) ×2
DRAPE SHEET LG 3/4 BI-LAMINATE (DRAPES) ×3 IMPLANT
DRAPE SURG IRRIG POUCH 19X23 (DRAPES) ×3 IMPLANT
DRSG OPSITE POSTOP 4X6 (GAUZE/BANDAGES/DRESSINGS) IMPLANT
DRSG OPSITE POSTOP 4X8 (GAUZE/BANDAGES/DRESSINGS) IMPLANT
ELECT PENCIL ROCKER SW 15FT (MISCELLANEOUS) IMPLANT
ELECT REM PT RETURN 15FT ADLT (MISCELLANEOUS) ×3 IMPLANT
GAUZE 4X4 16PLY ~~LOC~~+RFID DBL (SPONGE) ×6 IMPLANT
GLOVE BIO SURGEON STRL SZ 6 (GLOVE) ×12 IMPLANT
GLOVE BIO SURGEON STRL SZ 6.5 (GLOVE) ×3 IMPLANT
GOWN STRL REUS W/ TWL LRG LVL3 (GOWN DISPOSABLE) ×12 IMPLANT
GOWN STRL REUS W/TWL LRG LVL3 (GOWN DISPOSABLE) ×8
GRASPER SUT TROCAR 14GX15 (MISCELLANEOUS) IMPLANT
HOLDER FOLEY CATH W/STRAP (MISCELLANEOUS) IMPLANT
IRRIG SUCT STRYKERFLOW 2 WTIP (MISCELLANEOUS) ×2
IRRIGATION SUCT STRKRFLW 2 WTP (MISCELLANEOUS) ×3 IMPLANT
KIT PROCEDURE DA VINCI SI (MISCELLANEOUS) ×2
KIT PROCEDURE DVNC SI (MISCELLANEOUS) IMPLANT
KIT TURNOVER KIT A (KITS) IMPLANT
LIGASURE IMPACT 36 18CM CVD LR (INSTRUMENTS) IMPLANT
MANIPULATOR ADVINCU DEL 3.0 PL (MISCELLANEOUS) IMPLANT
MANIPULATOR ADVINCU DEL 3.5 PL (MISCELLANEOUS) IMPLANT
MANIPULATOR UTERINE 4.5 ZUMI (MISCELLANEOUS) IMPLANT
NDL HYPO 21X1.5 SAFETY (NEEDLE) ×3 IMPLANT
NDL SPNL 18GX3.5 QUINCKE PK (NEEDLE) IMPLANT
NEEDLE HYPO 21X1.5 SAFETY (NEEDLE) ×2 IMPLANT
NEEDLE SPNL 18GX3.5 QUINCKE PK (NEEDLE) ×2 IMPLANT
OBTURATOR OPTICAL STANDARD 8MM (TROCAR) ×2
OBTURATOR OPTICAL STND 8 DVNC (TROCAR) ×2
OBTURATOR OPTICALSTD 8 DVNC (TROCAR) ×3 IMPLANT
PACK ROBOT GYN CUSTOM WL (TRAY / TRAY PROCEDURE) ×3 IMPLANT
PAD POSITIONING PINK XL (MISCELLANEOUS) ×3 IMPLANT
PORT ACCESS TROCAR AIRSEAL 12 (TROCAR) ×3 IMPLANT
PORT ACCESS TROCAR AIRSEAL 5M (TROCAR) ×2
SEAL CANN UNIV 5-8 DVNC XI (MISCELLANEOUS) ×12 IMPLANT
SEAL XI 5MM-8MM UNIVERSAL (MISCELLANEOUS) ×8
SET TRI-LUMEN FLTR TB AIRSEAL (TUBING) ×3 IMPLANT
SOL PREP POV-IOD 4OZ 10% (MISCELLANEOUS) ×6 IMPLANT
SPIKE FLUID TRANSFER (MISCELLANEOUS) ×3 IMPLANT
SPONGE T-LAP 18X18 ~~LOC~~+RFID (SPONGE) IMPLANT
SURGIFLO W/THROMBIN 8M KIT (HEMOSTASIS) IMPLANT
SUT MNCRL AB 4-0 PS2 18 (SUTURE) IMPLANT
SUT PDS AB 1 TP1 54 (SUTURE) IMPLANT
SUT PDS AB 1 TP1 96 (SUTURE) IMPLANT
SUT VIC AB 0 CT1 27 (SUTURE)
SUT VIC AB 0 CT1 27XBRD ANTBC (SUTURE) IMPLANT
SUT VIC AB 2-0 CT1 27 (SUTURE) ×2
SUT VIC AB 2-0 CT1 TAPERPNT 27 (SUTURE) IMPLANT
SUT VIC AB 4-0 PS2 18 (SUTURE) ×6 IMPLANT
SUT VLOC 180 0 9IN  GS21 (SUTURE) ×2
SUT VLOC 180 0 9IN GS21 (SUTURE) IMPLANT
SYR 10ML LL (SYRINGE) IMPLANT
SYS BAG RETRIEVAL 10MM (BASKET) ×2
SYS WOUND ALEXIS 18CM MED (MISCELLANEOUS)
SYSTEM BAG RETRIEVAL 10MM (BASKET) IMPLANT
SYSTEM WOUND ALEXIS 18CM MED (MISCELLANEOUS) IMPLANT
TOWEL OR NON WOVEN STRL DISP B (DISPOSABLE) IMPLANT
TRAP SPECIMEN MUCUS 40CC (MISCELLANEOUS) IMPLANT
TRAY FOLEY MTR SLVR 16FR STAT (SET/KITS/TRAYS/PACK) ×3 IMPLANT
UNDERPAD 30X36 HEAVY ABSORB (UNDERPADS AND DIAPERS) ×6 IMPLANT
WATER STERILE IRR 1000ML POUR (IV SOLUTION) ×3 IMPLANT
YANKAUER SUCT BULB TIP 10FT TU (MISCELLANEOUS) IMPLANT

## 2022-07-14 NOTE — Transfer of Care (Signed)
Immediate Anesthesia Transfer of Care Note  Patient: Smera Schorsch  Procedure(s) Performed: XI ROBOTIC ASSISTED TOTAL HYSTERECTOMY WITH BILATERAL SALPINGO OOPHORECTOMY,  MINI LAPAROTOMY,CYSTOSCOPY (Bilateral) SENTINEL NODE BIOPSY  Patient Location: PACU  Anesthesia Type:General  Level of Consciousness: awake, drowsy and patient cooperative  Airway & Oxygen Therapy: Patient Spontanous Breathing and Patient connected to face mask oxygen  Post-op Assessment: Report given to RN and Post -op Vital signs reviewed and stable  Post vital signs: Reviewed and stable  Last Vitals:  Vitals Value Taken Time  BP 163/87 07/14/22 1136  Temp    Pulse 75 07/14/22 1140  Resp 17 07/14/22 1140  SpO2 99 % 07/14/22 1140  Vitals shown include unvalidated device data.  Last Pain:  Vitals:   07/14/22 0549  TempSrc: Oral         Complications: No notable events documented.

## 2022-07-14 NOTE — Brief Op Note (Signed)
07/14/2022  11:33 AM  PATIENT:  Whitney Santiago  61 y.o. female  PRE-OPERATIVE DIAGNOSIS:  ENDOMETRIAL INTRAEPITHELIAL NEOPLASIA  POST-OPERATIVE DIAGNOSIS:  ENDOMETRIAL INTRAEPITHELIAL NEOPLASIA  PROCEDURE:  Procedure(s): XI ROBOTIC ASSISTED TOTAL HYSTERECTOMY WITH BILATERAL SALPINGO OOPHORECTOMY,  MINI LAPAROTOMY,CYSTOSCOPY (Bilateral) SENTINEL NODE BIOPSY (N/A)  SURGEON:  Surgeon(s) and Role:    * Lafonda Mosses, MD - Primary  ASSISTANTS: Joylene John NP   ANESTHESIA:   general  EBL:  100 mL   BLOOD ADMINISTERED:none  DRAINS: none   LOCAL MEDICATIONS USED:  marcaine, exparel  SPECIMEN:  uterus, cervix, tube and ovaries, bilateral external iliac SLNs  DISPOSITION OF SPECIMEN:  PATHOLOGY  COUNTS:  YES  TOURNIQUET:  * No tourniquets in log *  DICTATION: .Note written in EPIC  PLAN OF CARE: Discharge to home after PACU  PATIENT DISPOSITION:  PACU - hemodynamically stable.   Delay start of Pharmacological VTE agent (>24hrs) due to surgical blood loss or risk of bleeding: not applicable

## 2022-07-14 NOTE — Interval H&P Note (Signed)
History and Physical Interval Note:  07/14/2022 6:37 AM  Whitney Santiago  has presented today for surgery, with the diagnosis of ENDOMETRIAL INTRAEPITHELIAL NEOPLASIA.  The various methods of treatment have been discussed with the patient and family. After consideration of risks, benefits and other options for treatment, the patient has consented to  Procedure(s): XI ROBOTIC ASSISTED TOTAL HYSTERECTOMY WITH BILATERAL SALPINGO OOPHORECTOMY, POSSIBLE MINI LAPAROTOMY (Bilateral) SENTINEL NODE BIOPSY (N/A) POSSIBLE PELVIC LYMPH NODE DISSECTION (N/A) as a surgical intervention.  The patient's history has been reviewed, patient examined, no change in status, stable for surgery.  I have reviewed the patient's chart and labs.  Questions were answered to the patient's satisfaction.     Whitney Santiago

## 2022-07-14 NOTE — Anesthesia Procedure Notes (Signed)
Procedure Name: Intubation Date/Time: 07/14/2022 7:39 AM  Performed by: Eben Burow, CRNAPre-anesthesia Checklist: Patient identified, Emergency Drugs available, Suction available, Patient being monitored and Timeout performed Patient Re-evaluated:Patient Re-evaluated prior to induction Oxygen Delivery Method: Circle system utilized Preoxygenation: Pre-oxygenation with 100% oxygen Induction Type: IV induction Ventilation: Mask ventilation without difficulty Laryngoscope Size: Mac and 4 Grade View: Grade I Tube type: Oral Tube size: 7.0 mm Number of attempts: 1 Airway Equipment and Method: Stylet Placement Confirmation: ETT inserted through vocal cords under direct vision, positive ETCO2 and breath sounds checked- equal and bilateral Secured at: 22 cm Tube secured with: Tape Dental Injury: Teeth and Oropharynx as per pre-operative assessment

## 2022-07-14 NOTE — Discharge Instructions (Addendum)
AFTER SURGERY INSTRUCTIONS   Return to work: 4-6 weeks if applicable  You will have a white honeycomb dressing over your larger incision. This dressing can be removed 5 days after surgery and you do not need to reapply a new dressing. Once you remove the dressing, you will notice that you have the surgical glue (dermabond) on the incision and this will peel off on its own. You can get this dressing wet in the shower the days after surgery prior to removal on the 5th day.    Activity: 1. Be up and out of the bed during the day.  Take a nap if needed.  You may walk up steps but be careful and use the hand rail.  Stair climbing will tire you more than you think, you may need to stop part way and rest.    2. No lifting or straining for 6 weeks over 10 pounds. No pushing, pulling, straining for 6 weeks.   3. No driving for around 1 week(s).  Do not drive if you are taking narcotic pain medicine and make sure that your reaction time has returned.    4. You can shower as soon as the next day after surgery. Shower daily.  Use your regular soap and water (not directly on the incision) and pat your incision(s) dry afterwards; don't rub.  No tub baths or submerging your body in water until cleared by your surgeon. If you have the soap that was given to you by pre-surgical testing that was used before surgery, you do not need to use it afterwards because this can irritate your incisions.    5. No sexual activity and nothing in the vagina for 8-10 weeks.   6. You may experience a small amount of clear drainage from your incisions, which is normal.  If the drainage persists, increases, or changes color please call the office.   7. Do not use creams, lotions, or ointments such as neosporin on your incisions after surgery until advised by your surgeon because they can cause removal of the dermabond glue on your incisions.     8. You may experience vaginal spotting after surgery or around the 6-8 week mark from  surgery when the stitches at the top of the vagina begin to dissolve.  The spotting is normal but if you experience heavy bleeding, call our office.   9. Take Tylenol first for pain if you are able to take these medications and only use narcotic pain medication for severe pain not relieved by the Tylenol.  Monitor your Tylenol intake to a max of 4,000 mg in a 24 hour period.    Diet: 1. Low sodium Heart Healthy Diet is recommended but you are cleared to resume your normal (before surgery) diet after your procedure.   2. It is safe to use a laxative, such as Miralax or Colace, if you have difficulty moving your bowels. You have been prescribed Sennakot-S to take at bedtime every evening after surgery to keep bowel movements regular and to prevent constipation.     Wound Care: 1. Keep clean and dry.  Shower daily.   Reasons to call the Doctor: Fever - Oral temperature greater than 100.4 degrees Fahrenheit Foul-smelling vaginal discharge Difficulty urinating Nausea and vomiting Increased pain at the site of the incision that is unrelieved with pain medicine. Difficulty breathing with or without chest pain New calf pain especially if only on one side Sudden, continuing increased vaginal bleeding with or without clots.  Contacts: For questions or concerns you should contact:   Dr. Jeral Pinch at [phone]   Joylene John, NP at [phone]   After Hours: call [phone] and have the GYN Oncologist paged/contacted (after 5 pm or on the weekends).   Messages sent via mychart are for non-urgent matters and are not responded to after hours so for urgent needs, please call the after hours number.

## 2022-07-14 NOTE — Op Note (Signed)
OPERATIVE NOTE  Pre-operative Diagnosis: EIN, fibroid uterus, adnexal mass  Post-operative Diagnosis: same, left hydrosalpinx with suspected endometriosis or other process causing pelvic adhesions  Operation: Robotic-assisted laparoscopic total hysterectomy with bilateral salpingo-oophorectomy, SLN biopsy bilaterally, bilateral ureterolysis down to the level of the cervix, cystoscopy, mini-lap for specimen removal   Surgeon: Jeral Pinch MD  Assistant Surgeon: Joylene John NP  Anesthesia: GET  Urine Output: 150 cc  Operative Findings: On EUA, enlarged somewhat mobile uterus. On intra-abdominal entry, normal upper abdominal survey. Normal omentum, small bowel. Large bowel densely adherent to the left sidewall just superior to the IP ligament and along the medial leaf of the broad ligament. Uterus 12-14 cm and very bulbous. Right adnexa normal appearing although ovary somewhat adherent to the medial aspect of the broad ligament and fallopian tube adherent to the right uterosacral ligament along the fimbriated end. Left adnexa somewhat retroperitonealized with approximately 6 cm cystic lesions (appeared most consistent with paratubal cyst versus hydrosalpinx). Ovary densely adherent to the broad ligament. Mapping successful to bilateral external iliac SLNs. No intra-abdominal or pelvic evidence of disease. Mini-lap required for specimen removal.  On cystoscopy, dome intact. Good efflux from bilateral ureteral orifices.   Estimated Blood Loss:  100 cc      Total IV Fluids: see I&O flowsheet         Specimens: uterus, cervix, bilateral tubes and ovaries, bilateral external iliac SLNs         Complications:  None apparent; patient tolerated the procedure well.         Disposition: PACU - hemodynamically stable.  Procedure Details  The patient was seen in the Holding Room. The risks, benefits, complications, treatment options, and expected outcomes were discussed with the patient.  The  patient concurred with the proposed plan, giving informed consent.  The site of surgery properly noted/marked. The patient was identified as Whitney Santiago and the procedure verified as a Robotic-assisted hysterectomy with bilateral salpingo oophorectomy with SLN biopsy.   After induction of anesthesia, the patient was draped and prepped in the usual sterile manner. Patient was placed in supine position after anesthesia and draped and prepped in the usual sterile manner as follows: Her arms were tucked to her side with all appropriate precautions.  The shoulders were stabilized with padded shoulder blocks applied to the acromium processes.  The patient was placed in the semi-lithotomy position in [place].  The perineum and vagina were prepped with Betadine. The patient prepped with ChloraPrep and then was draped after the ChloraPrep had been allowed to dry for 3 minutes.  A Time Out was held and the above information confirmed.  The urethra was prepped with Betadine. Foley catheter was placed.  A sterile speculum was placed in the vagina.  The cervix was grasped with a single-tooth tenaculum. '2mg'$  total of ICG was injected into the cervical stroma at 2 and 9 o'clock with 1cc injected at a 1cm and 52m depth (concentration 0.'5mg'$ /ml) in all locations. The cervix was dilated with PKennon Roundsdilators.  The 3.5 Delineator uterine manipulator with a colpotomizer ring was placed without difficulty.  A pneum occluder balloon was placed over the manipulator.  OG tube placement was confirmed and to suction.   Next, a 10 mm skin incision was made 1 cm below the subcostal margin in the midclavicular line.  The 5 mm Optiview port and scope was used for direct entry.  Opening pressure was under 10 mm CO2.  The abdomen was insufflated and the findings were  noted as above.   At this point and all points during the procedure, the patient's intra-abdominal pressure did not exceed 15 mmHg. Next, an 8 mm skin incision was made  superior to the umbilicus and a right and left port were placed about 8 cm lateral to the robot port on the right and left side.  A fourth arm was placed on the right.  The 5 mm assist trocar was exchanged for a 10-12 mm port. All ports were placed under direct visualization.  The patient was placed in steep Trendelenburg.  Bowel was folded away into the upper abdomen.  The robot was docked in the normal manner.  The right and left peritoneum were opened parallel to the IP ligament to open the retroperitoneal spaces bilaterally. The round ligaments were transected. The SLN mapping was performed in bilateral pelvic basins. After identifying the ureters, the para rectal and paravesical spaces were opened up entirely with careful dissection below the level of the ureters bilaterally and to the depth of the uterine artery origin in order to skeletonize the uterine "web" and ensure visualization of all parametrial channels. The para-aortic basins were carefully exposed and evaluated for isolated para-aortic SLN's. Lymphatic channels were identified travelling to the following visualized sentinel lymph node's: bilateral external iliac SLNs. These SLN's were separated from their surrounding lymphatic tissue, removed and sent for permanent pathology.  The hysterectomy was started.  The ureter was again noted to be on the medial leaf of the broad ligament on the right.  The peritoneum above the ureter was incised and stretched and the infundibulopelvic ligament was skeletonized, cauterized and cut.  The posterior peritoneum was taken down to the level of the KOH ring.  To do this, the path of the ureter was followed down to the level of the cervix given it proximity to the cervix and the adherent ovary along the medial leaf of the broad ligament.  On the left, sharp dissection was used to mobilize the sigmoid colon at its attachment superior to the infundibulopelvic ligament.  Dense adhesions were noted along the medial  leaf of the broad ligament.  Ultimately, the infundibulopelvic ligament was isolated by making an incision in the medial leaf of the broad ligament superior to both the ureter and the adherent colon.  Once the infundibulopelvic ligament had been isolated, it was cauterized and transected.  At this point, the left adnexa was identified and the cystic lesion felt to be a hydrosalpinx was incised and drained.  Given adherence along the medial leaf of the broad ligament, the utero-ovarian ligament was ultimately cauterized and transected, leaving the left adnexa to be removed after the hysterectomy.  The left ureter was identified again along the medial leaf the broad ligament and its course was followed down to the level of the cervix.    The anterior peritoneum was also taken down.  The bladder flap was created to the level of the KOH ring.  The uterine artery on the right side was skeletonized, cauterized and cut in the normal manner.  A similar procedure was performed on the left.  The colpotomy was made and the uterus, cervix, right tube and ovary were amputated and sent a 15 mm Endo Catch bag inserted through the assist trocar.  Pedicles were inspected and excellent hemostasis was achieved.    Attention was then turned back to the left.  The left adnexa was elevated and a combination of sharp dissection and short burst of monopolar electrocautery was used to  mobilize the adnexa from the medial leaf of the broad ligament, superior to the colon, ultimately freeing the left adnexa.  This was placed in a 10 mm Endo Catch bag.  The colpotomy at the vaginal cuff was closed with figure of eight 0 Vicryl in a figure of eight at each apex and 0 V-Loc to close the midportion. Irrigation was used and excellent hemostasis was achieved.  Surgiflo was placed along the the left broad ligament and hysterectomy bed. Robotic instruments were removed under direct visulaization.  The robot was undocked.   Attention was then  turned below and cystoscopy was performed after the bladder was instilled with 200cc of sterile fluid and the catheter was removed. Findings are as noted above.   Attention was then turned above. Robotic instruments were removed. The robot was undocked and the patient flattened. With the abdomen insufflated, the supraumbilical trocar was removed and the incision extended 8-10 cm with a scalpel. The incision was carried down to and through the fascia, with the abdomen insufflated, using monopolar electrocautery. The peritoneal incision was extended under direct visualization. The endocatch bag with the uterine specimen was delivered through the incision as was the bag with the left adnexa. The incision was then closed with running #1 looped PDS tied in the midline. The subcutaneous tissue was irrigated and hemostasis achieved. Exparel was injected for local anesthesia. The subcutaneous tissue was closed with 2-0 Vicyrl in running fashion.   The fascia at the 10-12 mm port was closed with 0 Vicryl on a UR-5 needle.  The subcuticular tissue of all incisions was closed with 4-0 Vicryl and the skin was closed with 4-0 Monocryl in a subcuticular manner.  Dermabond was applied.    The vagina was swabbed with  minimal bleeding noted.   All sponge, lap and needle counts were correct x  3.   The patient was transferred to the recovery room in stable condition.  Jeral Pinch, MD

## 2022-07-14 NOTE — Anesthesia Postprocedure Evaluation (Signed)
Anesthesia Post Note  Patient: Armed forces training and education officer  Procedure(s) Performed: XI ROBOTIC ASSISTED TOTAL HYSTERECTOMY WITH BILATERAL SALPINGO OOPHORECTOMY,  MINI LAPAROTOMY,CYSTOSCOPY (Bilateral) SENTINEL NODE BIOPSY     Patient location during evaluation: PACU Anesthesia Type: General Level of consciousness: awake and alert Pain management: pain level controlled Vital Signs Assessment: post-procedure vital signs reviewed and stable Respiratory status: spontaneous breathing, nonlabored ventilation and respiratory function stable Cardiovascular status: blood pressure returned to baseline Postop Assessment: no apparent nausea or vomiting Anesthetic complications: no   No notable events documented.  Last Vitals:  Vitals:   07/14/22 1245 07/14/22 1300  BP: (!) 142/82 137/80  Pulse: 71 75  Resp: 13 14  Temp:  (!) 36.4 C  SpO2: 92% 93%    Last Pain:  Vitals:   07/14/22 1300  TempSrc:   PainSc: Asleep                 Marthenia Rolling

## 2022-07-15 ENCOUNTER — Encounter (HOSPITAL_COMMUNITY): Payer: Self-pay | Admitting: Gynecologic Oncology

## 2022-07-15 ENCOUNTER — Telehealth: Payer: Self-pay | Admitting: Surgery

## 2022-07-15 LAB — SURGICAL PATHOLOGY

## 2022-07-15 NOTE — Telephone Encounter (Signed)
Spoke with Ms. Joos this morning. She states she is eating, drinking and urinating well. She has not had a BM yet but is passing gas. She is taking senokot as prescribed and encouraged her to drink plenty of water. She denies fever or chills. Incisions are dry and intact. She rates her pain 6/10. Her pain is controlled with Tramadol.     Instructed to call office with any fever, chills, purulent drainage, uncontrolled pain or any other questions or concerns. Patient verbalizes understanding.   Pt aware of post op appointments as well as the office number [phone] and after hours number [phone] to call if she has any questions or concerns

## 2022-07-20 ENCOUNTER — Telehealth: Payer: Self-pay | Admitting: *Deleted

## 2022-07-20 NOTE — Telephone Encounter (Signed)
Per patient request Emailed FMLA/disability paperwork to [email or web address]

## 2022-07-21 ENCOUNTER — Inpatient Hospital Stay: Payer: BC Managed Care – PPO | Attending: Gynecologic Oncology | Admitting: Gynecologic Oncology

## 2022-07-21 ENCOUNTER — Encounter: Payer: Self-pay | Admitting: Gynecologic Oncology

## 2022-07-21 DIAGNOSIS — Z90710 Acquired absence of both cervix and uterus: Secondary | ICD-10-CM

## 2022-07-21 DIAGNOSIS — Z90722 Acquired absence of ovaries, bilateral: Secondary | ICD-10-CM

## 2022-07-21 DIAGNOSIS — Z7189 Other specified counseling: Secondary | ICD-10-CM

## 2022-07-21 DIAGNOSIS — N8502 Endometrial intraepithelial neoplasia [EIN]: Secondary | ICD-10-CM

## 2022-07-21 NOTE — Progress Notes (Signed)
Gynecologic Oncology Telehealth Note: Gyn-Onc  I connected with Corlis Hove on 07/21/22 at  4:00 PM EDT by telephone and verified that I am speaking with the correct person using two identifiers.  I discussed the limitations, risks, security and privacy concerns of performing an evaluation and management service by telemedicine and the availability of in-person appointments. I also discussed with the patient that there may be a patient responsible charge related to this service. The patient expressed understanding and agreed to proceed.  Other persons participating in the visit and their role in the encounter: none.  Patient's location: home Provider's location: [facility]  Reason for Visit: follow-up after surgery  Treatment History: 10/26: Robotic-assisted laparoscopic total hysterectomy with bilateral salpingo-oophorectomy, SLN biopsy bilaterally, bilateral ureterolysis down to the level of the cervix, cystoscopy, mini-lap for specimen removal    Interval History: Doing well. Denies abdominal pain, still some soreness. Reports baseline bowel and bladder function. Denies any vaginal bleeding or discharge.  Past Medical/Surgical History: Past Medical History:  Diagnosis Date   Anxiety    no med   Arthritis    in both knees has received injections in the past. 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, s/p 4 weeks Rituxan, chlorambucil 10/21/99-04/19/00   Hx of iron deficiency anemia    Hx -per [facility] notes, since childhood, possible thalassemia   Hypertension    LAD (lymphadenopathy), axillary    eval with onc and [facility] 2016   Obesity    Pre-diabetes    HgA1c 6.4 on 01/24/2022    Past Surgical History:  Procedure Laterality Date   AXILLARY LYMPH NODE BIOPSY Right 06/09/2016   Procedure: AXILLARY LYMPH NODE BIOPSY;  Surgeon: Jackolyn Confer, MD;  Location: [place];  Service: General;  Laterality: Right;   BREAST EXCISIONAL BIOPSY Left 2006   BREAST SURGERY Left 2007   due to bloody discharge   [place] N/A 05/24/2016   Procedure: [place];  Surgeon: Salvadore Dom, MD;  Location: [facility];  Service: Gynecology;  Laterality: N/A;   DILATATION & CURETTAGE/HYSTEROSCOPY WITH MYOSURE N/A 06/07/2022   Procedure: DILATATION & CURETTAGE/HYSTEROSCOPY WITH MYOSURE;  Surgeon: Salvadore Dom, MD;  Location: [place];  Service: Gynecology;  Laterality: N/A;   LAPAROSCOPIC LYSIS OF ADHESIONS  06/07/2022   Procedure: LAPAROSCOPIC LYSIS OF ADHESIONS;  Surgeon: Salvadore Dom, MD;  Location: [place];  Service: Gynecology;;   LAPAROSCOPIC REMOVAL ABDOMINAL MASS  2000   LLQ in the setting of Hodgkins   LAPAROSCOPIC SALPINGO OOPHERECTOMY Bilateral 06/07/2022   Procedure: DIAGNOSTIC LAPAROSCOPY/ [place];  Surgeon: Salvadore Dom, MD;  Location: [facility];  Service: Gynecology;  Laterality: Bilateral;   ROBOTIC ASSISTED TOTAL HYSTERECTOMY WITH BILATERAL SALPINGO OOPHERECTOMY Bilateral 07/14/2022   Procedure: XI ROBOTIC ASSISTED TOTAL HYSTERECTOMY WITH BILATERAL SALPINGO OOPHORECTOMY,  MINI LAPAROTOMY,CYSTOSCOPY;  Surgeon: Lafonda Mosses, MD;  Location: [facility];  Service: Gynecology;  Laterality: Bilateral;   SENTINEL NODE BIOPSY N/A 07/14/2022   Procedure: SENTINEL NODE BIOPSY;  Surgeon: Lafonda Mosses, MD;  Location: [facility];  Service: Gynecology;  Laterality: N/A;    Family History  Problem Relation Age of Onset   Hypertension Mother    Stroke Mother 88  Hypertension Father    Diabetes Father    Hypertension Sister    Liver disease Sister        related to infected appendix   Diabetes Sister    Breast cancer Sister    Hypertension Sister     Diabetes Sister    Hypertension Brother    Diabetes Brother    Hypertension Daughter    Colon cancer Neg Hx    Ovarian cancer Neg Hx    Endometrial cancer Neg Hx    Pancreatic cancer Neg Hx    Prostate cancer Neg Hx     Social History   Socioeconomic History   Marital status: Divorced    Spouse name: Not on file   Number of children: Not on file   Years of education: Not on file   Highest education level: Not on file  Occupational History   Occupation: works at group home; 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, research (medical)  Tobacco Use   Smoking status: Never   Smokeless tobacco: Never  Vaping Use   Vaping Use: Never used  Substance and Sexual Activity   Alcohol use: No    Alcohol/week: 0.0 standard drinks of alcohol   Drug use: No   Sexual activity: Not Currently    Partners: Male    Birth control/protection: Post-menopausal  Other Topics Concern   Not on file  Social History Narrative   Work or School: retail; 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      Home Situation: lives with her friend       Spiritual Beliefs: no      Lifestyle: no regular exercise; diet is poor            Social Determinants of Radio broadcast assistant Strain: Not on file  Food Insecurity: No Food Insecurity (08/24/2020)   Hunger Vital Sign    Worried About Running Out of Food in the Last Year: Never true    Ran Out of Food in the Last Year: Never true  Transportation Needs: Not on file  Physical Activity: Not on file  Stress: Not on file  Social Connections: Not on file    Current Medications:  Current Outpatient Medications:    acetaminophen (TYLENOL) 500 MG tablet, Take 2 tablets (1,000 mg total) by mouth every 6 (six) hours as needed., Disp: 100 tablet, Rfl: 2   Calcium Carb-Cholecalciferol (CALCIUM + D3 PO), Take 1 tablet by mouth daily. , Disp: , Rfl:    hydrochlorothiazide (HYDRODIURIL) 25 MG tablet, TAKE 1 TABLET BY MOUTH EVERY DAY, Disp: 90 tablet, Rfl: 1   Ketoconazole 2 %  FOAM, Apply 1 application. topically 3 (three) times a week. (Patient not taking: Reported on 07/05/2022), Disp: 100 g, Rfl: 2   lisinopril (ZESTRIL) 10 MG tablet, TAKE 1 TABLET BY MOUTH EVERY DAY, Disp: 90 tablet, Rfl: 1   pravastatin (PRAVACHOL) 10 MG tablet, Take 1 tablet (10 mg total) by mouth daily., Disp: 90 tablet, Rfl: 1   senna-docusate (SENOKOT-S) 8.6-50 MG tablet, Take 2 tablets by mouth at bedtime. For AFTER surgery, do not take if having diarrhea, Disp: 30 tablet, Rfl: 0   traMADol (ULTRAM) 50 MG tablet, Take 1 tablet (50 mg total) by mouth every 6 (six) hours as needed for severe pain. For AFTER surgery only, do not take or drive, Disp: 10 tablet, Rfl: 0  Review of Symptoms: Pertinent positives as per HPI.  Physical Exam: Deferred given limitations of phone visit.  Laboratory & Radiologic Studies: A. RIGHT  EXTERNAL ILIAC SENTINEL LYMPH NODE, EXCISION:  One benign lymph node, negative for carcinoma (0/1)   B.  LEFT EXTERNAL ILIAC SENTINEL LYMPH NODE, EXCISION:  One benign lymph node, negative for carcinoma (0/1)   C. UTERUS WITH RIGHT AND LEFT FALLOPIAN TUBE AND OVARY, HYSTERECTOMY AND  BILATERAL SALPINGO-OOPHORECTOMY:  Benign endometrial polyp showing simple hyperplasia without atypia  Benign leiomyomata, intramural, largest measuring 9 cm in greatest  dimension  Benign proliferative phase endometrium  Chronic cervicitis with squamous metaplasia  Hydrosalpinx of left fallopian tube  Benign ovaries and right fallopian tube  Negative for carcinoma   Assessment & Plan: Whitney Santiago is a 60 y.o. 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  Patient is doing well.  Meeting postoperative milestones.  Discussed continued restrictions and expectations.  Reviewed pathology with her.  She is very happy with this news.  I discussed the assessment and treatment plan with the patient. The patient was provided with an opportunity  to ask questions and all were answered. The patient agreed with the plan and demonstrated an understanding of the instructions.   The patient was advised to call back or see an in-person evaluation if the symptoms worsen or if the condition fails to improve as anticipated.   8 minutes of total time was spent for this patient encounter, including preparation, phone counseling with the patient and coordination of care, and documentation of the encounter.   Jeral Pinch, MD  Division of Gynecologic Oncology  Department of Obstetrics and Gynecology  [facility]

## 2022-07-25 ENCOUNTER — Ambulatory Visit (INDEPENDENT_AMBULATORY_CARE_PROVIDER_SITE_OTHER): Payer: BC Managed Care – PPO

## 2022-07-25 ENCOUNTER — Ambulatory Visit: Payer: BC Managed Care – PPO

## 2022-07-25 DIAGNOSIS — Z23 Encounter for immunization: Secondary | ICD-10-CM | POA: Diagnosis not present

## 2022-07-29 ENCOUNTER — Other Ambulatory Visit: Payer: Self-pay | Admitting: *Deleted

## 2022-07-29 MED ORDER — PRAVASTATIN SODIUM 10 MG PO TABS: 10.0000 mg | ORAL_TABLET | Freq: Every day | ORAL | 1 refills | Status: DC

## 2022-08-05 ENCOUNTER — Inpatient Hospital Stay (HOSPITAL_BASED_OUTPATIENT_CLINIC_OR_DEPARTMENT_OTHER): Payer: BC Managed Care – PPO | Admitting: Gynecologic Oncology

## 2022-08-05 ENCOUNTER — Other Ambulatory Visit: Payer: Self-pay

## 2022-08-05 ENCOUNTER — Encounter: Payer: Self-pay | Admitting: Gynecologic Oncology

## 2022-08-05 VITALS — BP 136/60 | HR 83 | Temp 98.5°F | Resp 18 | Ht 62.99 in | Wt 207.4 lb

## 2022-08-05 DIAGNOSIS — M792 Neuralgia and neuritis, unspecified: Secondary | ICD-10-CM

## 2022-08-05 DIAGNOSIS — D259 Leiomyoma of uterus, unspecified: Secondary | ICD-10-CM

## 2022-08-05 DIAGNOSIS — Z90722 Acquired absence of ovaries, bilateral: Secondary | ICD-10-CM

## 2022-08-05 DIAGNOSIS — Z7189 Other specified counseling: Secondary | ICD-10-CM

## 2022-08-05 DIAGNOSIS — N8502 Endometrial intraepithelial neoplasia [EIN]: Secondary | ICD-10-CM

## 2022-08-05 DIAGNOSIS — Z90710 Acquired absence of both cervix and uterus: Secondary | ICD-10-CM

## 2022-08-05 NOTE — Patient Instructions (Addendum)
It was good to see you today.  You are healing well from surgery.  Please remember, no heavy lifting until at least 6 weeks after surgery and nothing in the vagina for 8-10.  Please let me know after Thanksgiving if you are right outside thigh pain has not improved or resolved.  I will let your OB/GYN know results from surgery.  You do not need any further follow-up with me but can continue seeing your OB/GYN for your GYN care.

## 2022-08-05 NOTE — Progress Notes (Signed)
Gynecologic Oncology Return Clinic Visit  08/05/22  Reason for Visit: Postoperative follow-up  Treatment History: CT of the abdomen and pelvis on 04/11/2022 to assess treatment response of the patient's nodular lymphocytic predominant Hodgkin's lymphoma.  This showed an incidental finding of a septated 4.9 cm left adnexal cystic lesion measuring up to 4.9 cm.   Pelvic ultrasound on 05/05/2022 shows a uterus measuring 8.2 x 10 x 13.8 cm with several fibroids, the largest of which measures 7.4 x 8.2.  Endometrium is noted to be 7.5 cm and asymmetric, possible with a polyp with a feeder vessel measuring almost 1 cm.  Left ovary normal in appearance.  Large avascular tubular structure seen in the left adnexa measuring 6.4 x 2.8 x 4.6 cm, appears tortuous, suspicious for hydrosalpinx.  Right ovary normal in appearance.  Trace free fluid.   Patient was taken for diagnostic laparoscopy, lysis of adhesions, hysteroscopy, and D&C.  Findings at the time of surgery were enlarged fibroid uterus, bilateral hydrosalpinx.  Both fallopian tubes were adherent to the ovaries, neither of which was able to be seen well.  Given the large size of the uterus, it was deemed not feasible to take down the adnexa without performing a hysterectomy.  On hysteroscopy, there were 2 focal areas of endometrial thickening, otherwise normal and thin endometrium.  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  Only focal atypia identified.  Most of the hyperplasia seen within the polyp does not show atypia.   07/14/2022: TRH/BSO, bilateral sentinel lymph node biopsy, bilateral ureterolysis, cystoscopy, mini lap for specimen removal  Interval History: Reports overall doing well.  Denies any vaginal bleeding or discharge.  Reports improvement in urinary function since prior to surgery.  Endorses regular bowel function.  Denies any abdominal or pelvic pain.  Began having right lateral thigh pain that she describes as  sharp or burning 2 days ago.  This was not associated with any activity.  Past Medical/Surgical History: Past Medical History:  Diagnosis Date   Anxiety    no med   Arthritis    in both knees has received injections in the past. 05/24/2022   Fibroid    History of migraine    Hx - last migraine 2 yrs ago, no longer a problem   HLD (hyperlipidemia)    Hodgkin disease ([place]) 1999   lymphocytic predominant Hodkin's 2000, s/p 4 weeks Rituxan, chlorambucil 10/21/99-04/19/00   Hx of iron deficiency anemia    Hx -per [facility] notes, since childhood, possible thalassemia   Hypertension    LAD (lymphadenopathy), axillary    eval with onc and [facility] 2016   Obesity    Pre-diabetes    HgA1c 6.4 on 01/24/2022    Past Surgical History:  Procedure Laterality Date   AXILLARY LYMPH NODE BIOPSY Right 06/09/2016   Procedure: AXILLARY LYMPH NODE BIOPSY;  Surgeon: Jackolyn Confer, MD;  Location: [facility];  Service: General;  Laterality: Right;   BREAST EXCISIONAL BIOPSY Left 2006   BREAST SURGERY Left 2007   due to bloody discharge   [place] N/A 05/24/2016   Procedure: [place];  Surgeon: Salvadore Dom, MD;  Location: [facility];  Service: Gynecology;  Laterality: N/A;   DILATATION & CURETTAGE/HYSTEROSCOPY WITH MYOSURE N/A 06/07/2022   Procedure: DILATATION & CURETTAGE/HYSTEROSCOPY WITH MYOSURE;  Surgeon: Salvadore Dom, MD;  Location: [facility];  Service: Gynecology;  Laterality: N/A;   LAPAROSCOPIC LYSIS OF  ADHESIONS  06/07/2022   Procedure: LAPAROSCOPIC LYSIS OF ADHESIONS;  Surgeon: Salvadore Dom, MD;  Location: [facility];  Service: Gynecology;;   LAPAROSCOPIC REMOVAL ABDOMINAL MASS  2000   LLQ in the setting of Hodgkins   LAPAROSCOPIC SALPINGO OOPHERECTOMY Bilateral 06/07/2022   Procedure: DIAGNOSTIC LAPAROSCOPY/ [place];  Surgeon: Salvadore Dom, MD;   Location: [facility];  Service: Gynecology;  Laterality: Bilateral;   ROBOTIC ASSISTED TOTAL HYSTERECTOMY WITH BILATERAL SALPINGO OOPHERECTOMY Bilateral 07/14/2022   Procedure: XI ROBOTIC ASSISTED TOTAL HYSTERECTOMY WITH BILATERAL SALPINGO OOPHORECTOMY,  MINI LAPAROTOMY,CYSTOSCOPY;  Surgeon: Lafonda Mosses, MD;  Location: [facility];  Service: Gynecology;  Laterality: Bilateral;   SENTINEL NODE BIOPSY N/A 07/14/2022   Procedure: SENTINEL NODE BIOPSY;  Surgeon: Lafonda Mosses, MD;  Location: [facility];  Service: Gynecology;  Laterality: N/A;    Family History  Problem Relation Age of Onset   Hypertension Mother    Stroke Mother 28   Hypertension Father    Diabetes Father    Hypertension Sister    Liver disease Sister        related to infected appendix   Diabetes Sister    Breast cancer Sister    Hypertension Sister    Diabetes Sister    Hypertension Brother    Diabetes Brother    Hypertension Daughter    Colon cancer Neg Hx    Ovarian cancer Neg Hx    Endometrial cancer Neg Hx    Pancreatic cancer Neg Hx    Prostate cancer Neg Hx     Social History   Socioeconomic History   Marital status: Divorced    Spouse name: Not on file   Number of children: Not on file   Years of education: Not on file   Highest education level: Not on file  Occupational History   Occupation: works at group home; 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, research (medical)  Tobacco Use   Smoking status: Never   Smokeless tobacco: Never  Vaping Use   Vaping Use: Never used  Substance and Sexual Activity   Alcohol use: No    Alcohol/week: 0.0 standard drinks of alcohol   Drug use: No   Sexual activity: Not Currently    Partners: Male    Birth control/protection: Post-menopausal  Other Topics Concern   Not on file  Social History Narrative   Work or School: retail; 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      Home Situation: lives with her friend       Spiritual Beliefs: no      Lifestyle:  no regular exercise; diet is poor            Social Determinants of Radio broadcast assistant Strain: Not on file  Food Insecurity: No Food Insecurity (08/24/2020)   Hunger Vital Sign    Worried About Running Out of Food in the Last Year: Never true    Ran Out of Food in the Last Year: Never true  Transportation Needs: Not on file  Physical Activity: Not on file  Stress: Not on file  Social Connections: Not on file    Current Medications:  Current Outpatient Medications:    acetaminophen (TYLENOL) 500 MG tablet, Take 2 tablets (1,000 mg total) by mouth every 6 (six) hours as needed., Disp: 100 tablet, Rfl: 2   Calcium Carb-Cholecalciferol (CALCIUM + D3 PO), Take 1 tablet by mouth daily. , Disp: , Rfl:    hydrochlorothiazide (HYDRODIURIL) 25 MG tablet, TAKE  1 TABLET BY MOUTH EVERY DAY, Disp: 90 tablet, Rfl: 1   lisinopril (ZESTRIL) 10 MG tablet, TAKE 1 TABLET BY MOUTH EVERY DAY, Disp: 90 tablet, Rfl: 1   pravastatin (PRAVACHOL) 10 MG tablet, Take 1 tablet (10 mg total) by mouth daily., Disp: 90 tablet, Rfl: 1  Review of Systems: Denies appetite changes, fevers, chills, fatigue, unexplained weight changes. Denies hearing loss, neck lumps or masses, mouth sores, ringing in ears or voice changes. Denies cough or wheezing.  Denies shortness of breath. Denies chest pain or palpitations. Denies leg swelling. Denies abdominal distention, pain, blood in stools, constipation, diarrhea, nausea, vomiting, or early satiety. Denies pain with intercourse, dysuria, frequency, hematuria or incontinence. Denies hot flashes, pelvic pain, vaginal bleeding or vaginal discharge.   Denies joint pain, back pain. Denies itching, rash, or wounds. Denies dizziness, headaches, numbness or seizures. Denies swollen lymph nodes or glands, denies easy bruising or bleeding. Denies anxiety, depression, confusion, or decreased concentration.  Physical Exam: BP 136/60 (BP Location: Right Arm, Patient  Position: Sitting)   Pulse 83   Temp 98.5 F (36.9 C) (Oral)   Resp 18   Ht 5' 2.99" (1.6 m)   Wt 207 lb 6.4 oz (94.1 kg)   LMP 10/19/1998 (Approximate)   SpO2 100%   BMI 36.75 kg/m  General: Alert, oriented, no acute distress. HEENT: Normocephalic, atraumatic, sclera anicteric. Chest: Unlabored breathing on room air. Abdomen: Obese, soft, nontender.  Normoactive bowel sounds.  No masses or hepatosplenomegaly appreciated.  Well-healed incisions. Extremities: Grossly normal range of motion.  Warm, well perfused.  No edema bilaterally. GU: Normal appearing external genitalia without erythema, excoriation, or lesions.  Speculum exam reveals cuff intact, no blood or discharge within the vault, suture visible.  Bimanual exam reveals cuff intact, no fluctuance or tenderness with palpation.    Laboratory & Radiologic Studies: A. RIGHT EXTERNAL ILIAC SENTINEL LYMPH NODE, EXCISION:  One benign lymph node, negative for carcinoma (0/1)   B.  LEFT EXTERNAL ILIAC SENTINEL LYMPH NODE, EXCISION:  One benign lymph node, negative for carcinoma (0/1)   C. UTERUS WITH RIGHT AND LEFT FALLOPIAN TUBE AND OVARY, HYSTERECTOMY AND  BILATERAL SALPINGO-OOPHORECTOMY:  Benign endometrial polyp showing simple hyperplasia without atypia  Benign leiomyomata, intramural, largest measuring 9 cm in greatest  dimension  Benign proliferative phase endometrium  Chronic cervicitis with squamous metaplasia  Hydrosalpinx of left fallopian tube  Benign ovaries and right fallopian tube  Negative for carcinoma   Assessment & Plan: Whitney Santiago is a 61 y.o. 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  Patient is overall doing well, meeting postoperative milestones.  Discussed continued expectations and restrictions.  Reviewed pathology from surgery.  Patient was given a copy of her pathology report.  She was very happy with this news.  No  residual EIN and no malignancy seen on final specimen.  She will be discharged back to her OB/GYN for further care.  Discussed her right lateral thigh pain.  Somewhat unusual timing starting with 3 weeks after surgery.  This seems likely related to lateral femoral cutaneous nerve.  This can be seen after hysterectomy although the timing of her symptoms is somewhat unusual.  We discussed rest, exercises, and the use of heat and ice.  She will call me back after Thanksgiving if she continues to have symptoms.  If she does, can try gabapentin or discuss physical therapy.  18 minutes of total time was spent for  this patient encounter, including preparation, face-to-face counseling with the patient and coordination of care, and documentation of the encounter.  Jeral Pinch, MD  Division of Gynecologic Oncology  Department of Obstetrics and Gynecology  [facility]

## 2022-08-15 ENCOUNTER — Encounter: Payer: Self-pay | Admitting: *Deleted

## 2022-08-15 ENCOUNTER — Telehealth: Payer: Self-pay | Admitting: *Deleted

## 2022-08-15 NOTE — Telephone Encounter (Signed)
Per patient request fax work note for patient

## 2022-08-26 ENCOUNTER — Other Ambulatory Visit: Payer: Self-pay | Admitting: *Deleted

## 2022-08-26 MED ORDER — HYDROCHLOROTHIAZIDE 25 MG PO TABS: 25.0000 mg | ORAL_TABLET | Freq: Every day | ORAL | 1 refills | Status: DC

## 2022-08-26 MED ORDER — LISINOPRIL 10 MG PO TABS: 10.0000 mg | ORAL_TABLET | Freq: Every day | ORAL | 1 refills | Status: DC

## 2022-09-03 ENCOUNTER — Encounter (HOSPITAL_BASED_OUTPATIENT_CLINIC_OR_DEPARTMENT_OTHER): Payer: Self-pay | Admitting: Emergency Medicine

## 2022-09-03 ENCOUNTER — Emergency Department (HOSPITAL_BASED_OUTPATIENT_CLINIC_OR_DEPARTMENT_OTHER)
Admission: EM | Admit: 2022-09-03 | Discharge: 2022-09-03 | Disposition: A | Discharge status: Home / Self Care | Payer: BC Managed Care – PPO | Attending: Emergency Medicine | Admitting: Emergency Medicine

## 2022-09-03 ENCOUNTER — Other Ambulatory Visit: Payer: Self-pay

## 2022-09-03 DIAGNOSIS — M542 Cervicalgia: Secondary | ICD-10-CM | POA: Insufficient documentation

## 2022-09-03 DIAGNOSIS — Z79899 Other long term (current) drug therapy: Secondary | ICD-10-CM | POA: Insufficient documentation

## 2022-09-03 DIAGNOSIS — E119 Type 2 diabetes mellitus without complications: Secondary | ICD-10-CM | POA: Diagnosis not present

## 2022-09-03 DIAGNOSIS — I10 Essential (primary) hypertension: Secondary | ICD-10-CM | POA: Diagnosis not present

## 2022-09-03 DIAGNOSIS — M25512 Pain in left shoulder: Secondary | ICD-10-CM | POA: Diagnosis not present

## 2022-09-03 DIAGNOSIS — M549 Dorsalgia, unspecified: Secondary | ICD-10-CM | POA: Diagnosis not present

## 2022-09-03 DIAGNOSIS — Y92410 Unspecified street and highway as the place of occurrence of the external cause: Secondary | ICD-10-CM | POA: Diagnosis not present

## 2022-09-03 DIAGNOSIS — R519 Headache, unspecified: Secondary | ICD-10-CM | POA: Diagnosis not present

## 2022-09-03 MED ORDER — ACETAMINOPHEN 325 MG PO TABS
650.0000 mg | ORAL_TABLET | Freq: Once | ORAL | Status: AC
  Administered 2022-09-03: 650 mg via ORAL
  Filled 2022-09-03: qty 2

## 2022-09-03 MED ORDER — CYCLOBENZAPRINE HCL 10 MG PO TABS: 10.0000 mg | ORAL_TABLET | Freq: Two times a day (BID) | ORAL | 0 refills | Status: DC | PRN
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
## 2022-10-04 ENCOUNTER — Telehealth: Payer: Self-pay | Admitting: Oncology

## 2022-10-04 NOTE — Telephone Encounter (Signed)
Called patient per Dr. Alen Blew transition. Left voicemail for patient to call us back.

## 2022-10-05 ENCOUNTER — Telehealth: Payer: Self-pay | Admitting: Oncology

## 2022-10-05 ENCOUNTER — Telehealth: Payer: Self-pay | Admitting: *Deleted

## 2022-10-05 NOTE — Telephone Encounter (Signed)
Per scheduling message Rise Paganini - called and lvm of upcoming appointments (transfer of care from Dr. Alen Blew) requested callback to confirm.

## 2022-10-05 NOTE — Telephone Encounter (Signed)
Called patient regarding providers departure, patient is notified. Patient will get rescheduled with new provider.

## 2022-10-29 ENCOUNTER — Other Ambulatory Visit: Payer: Self-pay | Admitting: Family Medicine

## 2022-11-23 ENCOUNTER — Other Ambulatory Visit: Payer: Self-pay | Admitting: Gynecologic Oncology

## 2022-11-23 DIAGNOSIS — N8502 Endometrial intraepithelial neoplasia [EIN]: Secondary | ICD-10-CM

## 2023-01-02 ENCOUNTER — Encounter: Payer: Self-pay | Admitting: *Deleted

## 2023-01-27 ENCOUNTER — Encounter: Payer: BC Managed Care – PPO | Admitting: Family Medicine

## 2023-02-10 ENCOUNTER — Other Ambulatory Visit: Payer: Self-pay | Admitting: Family Medicine

## 2023-02-10 DIAGNOSIS — Z1231 Encounter for screening mammogram for malignant neoplasm of breast: Secondary | ICD-10-CM

## 2023-02-19 ENCOUNTER — Other Ambulatory Visit: Payer: Self-pay | Admitting: Family Medicine

## 2023-02-21 NOTE — Progress Notes (Signed)
HPI: [name] is a 62 y.o. female with past medical history significant for hypertension, DM 2, Hosking lymphoma (2000), and hyperlipidemia here today for her routine physical.  Last CPE: 2022 She is established with gyn, s/p total hysterectomy on 07/11/22 due to uterine leiomyoma.  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 She has made dietary changes, cooking more at home, eating vegetables daily, and practicing portion control.  She sleeps an average of seven hours per night and denies any history of smoking or alcohol consumption.  Immunization History  Administered Date(s) Administered   Influenza,inj,Quad PF,6+ Mos 06/26/2014, 06/19/2015, 07/05/2016, 06/25/2018, 07/08/2019, 07/26/2021, 07/25/2022   Moderna Sars-Covid-2 Vaccination 10/02/2019, 11/09/2019, 02/18/2020   Tdap 06/26/2014   Zoster Recombinat (Shingrix) 01/15/2021, 07/26/2021   Health Maintenance  Topic Date Due   OPHTHALMOLOGY EXAM  Never done   COVID-19 Vaccine (4 - 2023-24 season) 05/20/2022   Diabetic kidney evaluation - Urine ACR  07/26/2022   HEMOGLOBIN A1C  01/07/2023   INFLUENZA VACCINE  04/20/2023   Diabetic kidney evaluation - eGFR measurement  07/09/2023   PAP SMEAR-Modifier  07/17/2023   MAMMOGRAM  10/23/2023   FOOT EXAM  02/27/2024   DTaP/Tdap/Td (2 - Td or Tdap) 06/26/2024   Colonoscopy  11/27/2024   HIV Screening  Completed   Zoster Vaccines- Shingrix  Completed   Hepatitis C Screening  Addressed   HPV VACCINES  Aged Out   Last follow-up visit on 02/22/2022.  DM2: Diagnosed in 02/2018 with a hemoglobin A1c of 6.6. She is not monitoring BS at home. Currently she is not on pharmacologic treatment. Negative for symptoms of hypoglycemia, polyuria, polydipsia, numbness in extremities, or foot ulcers/trauma.  Lab Results  Component Value Date   HGBA1C 6.2 (H) 07/08/2022   Hyperlipidemia: Currently she is on pravastatin 10 mg daily. Lab Results  Component Value  Date   CHOL 211 (H) 01/24/2022   HDL 56.20 01/24/2022   LDLCALC 138 (H) 01/24/2022   TRIG 85.0 01/24/2022   CHOLHDL 4 01/24/2022   Hypertension: Currently she is on lisinopril 10 mg daily and HCTZ 25 mg daily. Negative for unusual/severe headache, exertional CP, orthopnea, PND, or focal weakness. She is not monitoring BP regularly.  Lab Results  Component Value Date   CREATININE 0.73 07/08/2022   BUN 14 07/08/2022   NA 137 07/08/2022   K 3.3 (L) 07/08/2022   CL 102 07/08/2022   CO2 26 07/08/2022   Lab Results  Component Value Date   WBC 7.9 07/08/2022   HGB 12.1 07/08/2022   HCT 37.5 07/08/2022   MCV 88.7 07/08/2022   PLT 406 (H) 07/08/2022   She reports a limitation of motion in her right knee, which she attributes to quitting one of her jobs. She has a history of receiving cortisone shots for the knee and has an appointment with an orthopedist tomorrow. She experiences numbness on the side of her thigh, which she states began after her surgery.  Review of Systems  Constitutional:  Negative for activity change, appetite change and fever.  HENT:  Negative for hearing loss, mouth sores, sore throat and trouble swallowing.   Eyes:  Negative for redness and visual disturbance.  Respiratory:  Negative for cough, shortness of breath and wheezing.   Cardiovascular:  Negative for chest pain and leg swelling.  Gastrointestinal:  Negative for abdominal pain, nausea and vomiting.       No changes in bowel habits.  Endocrine: Negative for cold intolerance, heat  intolerance, polydipsia, polyphagia and polyuria.  Genitourinary:  Negative for decreased urine volume, dysuria, hematuria, vaginal bleeding and vaginal discharge.  Musculoskeletal:  Positive for arthralgias. Negative for gait problem and myalgias.  Skin:  Negative for color change and rash.  Allergic/Immunologic: Negative for environmental allergies.  Neurological:  Positive for numbness (Lateral right thigh, chronic.).  Negative for seizures, syncope, weakness and headaches.  Hematological:  Negative for adenopathy. Does not bruise/bleed easily.  Psychiatric/Behavioral:  Negative for confusion. The patient is not nervous/anxious.   All other systems reviewed and are negative.  Current Outpatient Medications on File Prior to Visit  Medication Sig Dispense Refill   acetaminophen (TYLENOL) 500 MG tablet Take 2 tablets (1,000 mg total) by mouth every 6 (six) hours as needed. 100 tablet 2   Calcium Carb-Cholecalciferol (CALCIUM + D3 PO) Take 1 tablet by mouth daily.      cyclobenzaprine (FLEXERIL) 10 MG tablet Take 1 tablet (10 mg total) by mouth 2 (two) times daily as needed for muscle spasms. 20 tablet 0   hydrochlorothiazide (HYDRODIURIL) 25 MG tablet TAKE 1 TABLET (25 MG TOTAL) BY MOUTH DAILY. 90 tablet 1   lisinopril (ZESTRIL) 10 MG tablet TAKE 1 TABLET BY MOUTH EVERY DAY 90 tablet 1   pravastatin (PRAVACHOL) 10 MG tablet TAKE 1 TABLET BY MOUTH EVERY DAY 90 tablet 1   No current facility-administered medications on file prior to visit.   Past Medical History:  Diagnosis Date   Anxiety    no med   Arthritis    in both knees has received injections in the past. 05/24/2022   Fibroid    History of migraine    Hx - last migraine 2 yrs ago, no longer a problem   HLD (hyperlipidemia)    Hodgkin disease (HCC) 1999   lymphocytic predominant Hodkin's 2000, s/p 4 weeks Rituxan, chlorambucil 10/21/99-04/19/00   Hx of iron deficiency anemia    Hx -per [facility] notes, since childhood, possible thalassemia   Hypertension    LAD (lymphadenopathy), axillary    eval with onc and [facility] 2016   Obesity    Pre-diabetes    HgA1c 6.4 on 01/24/2022   Past Surgical History:  Procedure Laterality Date   AXILLARY LYMPH NODE BIOPSY Right 06/09/2016   Procedure: AXILLARY LYMPH NODE BIOPSY;  Surgeon: Avel Peace, MD;  Location: [facility] OR;  Service: General;  Laterality: Right;   BREAST EXCISIONAL BIOPSY Left 2006   BREAST SURGERY  Left 2007   due to bloody discharge   COLONOSCOPY     DILATATION & CURETTAGE/HYSTEROSCOPY WITH MYOSURE N/A 05/24/2016   Procedure: DILATATION & CURETTAGE/HYSTEROSCOPY WITH MYOSURE;  Surgeon: Romualdo Bolk, MD;  Location: [facility];  Service: Gynecology;  Laterality: N/A;   DILATATION & CURETTAGE/HYSTEROSCOPY WITH MYOSURE N/A 06/07/2022   Procedure: DILATATION & CURETTAGE/HYSTEROSCOPY WITH MYOSURE;  Surgeon: Romualdo Bolk, MD;  Location: [facility] [place];  Service: Gynecology;  Laterality: N/A;   LAPAROSCOPIC LYSIS OF ADHESIONS  06/07/2022   Procedure: LAPAROSCOPIC LYSIS OF ADHESIONS;  Surgeon: Romualdo Bolk, MD;  Location: [facility] [place];  Service: Gynecology;;   LAPAROSCOPIC REMOVAL ABDOMINAL MASS  2000   LLQ in the setting of Hodgkins   LAPAROSCOPIC SALPINGO OOPHERECTOMY Bilateral 06/07/2022   Procedure: DIAGNOSTIC LAPAROSCOPY/ PELVIC WASHING;  Surgeon: Romualdo Bolk, MD;  Location: [facility];  Service: Gynecology;  Laterality: Bilateral;   ROBOTIC ASSISTED TOTAL HYSTERECTOMY WITH BILATERAL SALPINGO OOPHERECTOMY Bilateral 07/14/2022   Procedure: XI ROBOTIC ASSISTED TOTAL HYSTERECTOMY WITH  BILATERAL SALPINGO OOPHORECTOMY,  MINI LAPAROTOMY,CYSTOSCOPY;  Surgeon: Carver Fila, MD;  Location: [facility];  Service: Gynecology;  Laterality: Bilateral;   SENTINEL NODE BIOPSY N/A 07/14/2022   Procedure: SENTINEL NODE BIOPSY;  Surgeon: Carver Fila, MD;  Location: [facility];  Service: Gynecology;  Laterality: N/A;   Allergies  Allergen Reactions   Hydrocodone Hives   Oxycodone Hives   Aspirin     Upset stomach   Rosuvastatin     Muscle aches   Family History  Problem Relation Age of Onset   Hypertension Mother    Stroke Mother 33   Hypertension Father    Diabetes Father    Hypertension Sister    Liver disease Sister        related to infected appendix   Diabetes Sister    Breast cancer Sister    Hypertension Sister     Diabetes Sister    Hypertension Brother    Diabetes Brother    Hypertension Daughter    Colon cancer Neg Hx    Ovarian cancer Neg Hx    Endometrial cancer Neg Hx    Pancreatic cancer Neg Hx    Prostate cancer Neg Hx    Social History   Socioeconomic History   Marital status: Divorced    Spouse name: Not on file   Number of children: Not on file   Years of education: Not on file   Highest education level: Not on file  Occupational History   Occupation: works at group home; 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  Tobacco Use   Smoking status: Never   Smokeless tobacco: Never  Vaping Use   Vaping Use: Never used  Substance and Sexual Activity   Alcohol use: No    Alcohol/week: 0.0 standard drinks of alcohol   Drug use: No   Sexual activity: Not Currently    Partners: Male    Birth control/protection: Post-menopausal  Other Topics Concern   Not on file  Social History Narrative   Work or School: retail; 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      Home Situation: lives with her friend       Spiritual Beliefs: no      Lifestyle: no regular exercise; diet is poor            Social Determinants of Corporate investment banker Strain: Not on file  Food Insecurity: No Food Insecurity (08/24/2020)   Hunger Vital Sign    Worried About Running Out of Food in the Last Year: Never true    Ran Out of Food in the Last Year: Never true  Transportation Needs: Not on file  Physical Activity: Not on file  Stress: Not on file  Social Connections: Not on file   Vitals:   02/27/23 0755  BP: 124/80  Pulse: 82  Resp: 16  Temp: 97.9 F (36.6 C)  SpO2: 99%   Body mass index is 37.28 kg/m.  Wt Readings from Last 3 Encounters:  02/27/23 210 lb 6 oz (95.4 kg)  09/03/22 165 lb 12.6 oz (75.2 kg)  08/05/22 207 lb 6.4 oz (94.1 kg)   Physical Exam Vitals and nursing note reviewed.  Constitutional:      General: She is not in acute distress.    Appearance: She is  well-developed.  HENT:     Head: Normocephalic and atraumatic.     Right Ear: Hearing, tympanic membrane, ear canal and external ear normal.     Left Ear: Hearing, tympanic  membrane, ear canal and external ear normal.     Mouth/Throat:     Mouth: Mucous membranes are moist.     Pharynx: Oropharynx is clear. Uvula midline.  Eyes:     Extraocular Movements: Extraocular movements intact.     Conjunctiva/sclera: Conjunctivae normal.     Pupils: Pupils are equal, round, and reactive to light.  Neck:     Thyroid: No thyromegaly.     Trachea: No tracheal deviation.  Cardiovascular:     Rate and Rhythm: Normal rate and regular rhythm.     Pulses:          Dorsalis pedis pulses are 2+ on the right side and 2+ on the left side.     Heart sounds: No murmur heard. Pulmonary:     Effort: Pulmonary effort is normal. No respiratory distress.     Breath sounds: Normal breath sounds.  Abdominal:     Palpations: Abdomen is soft. There is no hepatomegaly or mass.     Tenderness: There is no abdominal tenderness.  Genitourinary:    Comments: Deferred to gyn. Musculoskeletal:     Comments: No major deformity or signs of synovitis appreciated.  Lymphadenopathy:     Cervical: No cervical adenopathy.     Upper Body:     Right upper body: No supraclavicular adenopathy.     Left upper body: No supraclavicular adenopathy.  Skin:    General: Skin is warm.     Findings: No erythema or rash.  Neurological:     General: No focal deficit present.     Mental Status: She is alert and oriented to person, place, and time.     Cranial Nerves: No cranial nerve deficit.     Coordination: Coordination normal.     Gait: Gait normal.     Deep Tendon Reflexes:     Reflex Scores:      Bicep reflexes are 2+ on the right side and 2+ on the left side.      Patellar reflexes are 2+ on the right side and 2+ on the left side. Psychiatric:        Mood and Affect: Mood and affect normal.    ASSESSMENT AND PLAN: Ms.  Whitney Santiago was here today annual physical examination.  Orders Placed This Encounter  Procedures   Hemoglobin A1c   Comprehensive metabolic panel   Lipid panel   Microalbumin / creatinine urine ratio   Lab Results  Component Value Date   MICROALBUR <0.7 02/27/2023   MICROALBUR 1.8 07/26/2021   Lab Results  Component Value Date   CHOL 211 (H) 02/27/2023   HDL 63.80 02/27/2023   LDLCALC 129 (H) 02/27/2023   TRIG 89.0 02/27/2023   CHOLHDL 3 02/27/2023   Lab Results  Component Value Date   CREATININE 0.71 02/27/2023   BUN 17 02/27/2023   NA 137 02/27/2023   K 3.7 02/27/2023   CL 99 02/27/2023   CO2 29 02/27/2023   Lab Results  Component Value Date   ALT 18 02/27/2023   AST 20 02/27/2023   ALKPHOS 82 02/27/2023   BILITOT 0.5 02/27/2023   Lab Results  Component Value Date   HGBA1C 6.7 (H) 02/27/2023   Routine general medical examination at a health care facility Assessment & Plan: We discussed the importance of regular physical activity and healthy diet for prevention of chronic illness and/or complications. Preventive guidelines reviewed. Vaccination updated, pneumovax given today. Ca++ and vit D supplementation to continue. Continue female preventive care  with her gyn. Next CPE in a year.   Hyperlipidemia, unspecified hyperlipidemia type Assessment & Plan: Continue Pravastatin 10 mg daily and low fat diet. Further recommendations according to FLP result.  Orders: -     Comprehensive metabolic panel; Future -     Lipid panel; Future  Type 2 diabetes mellitus with other specified complication, without long-term current use of insulin (HCC) Assessment & Plan: HgA1C has been at goal. Continue non pharmacologic treatment. Annual eye exam (overdue), periodic dental and foot care recommended. F/U in 6 months.  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   Essential hypertension Assessment & Plan: BP adequately controlled. Continue Lisinopril 10 mg daily and HCTZ 25 mg daily and low salt diet. Due for eye exam.   Need for pneumococcal vaccination -     Pneumococcal polysaccharide vaccine 23-valent greater than or equal to 2yo subcutaneous/IM  Morbid obesity (HCC)-BMI 37 with comorbilities Assessment & Plan: Comorbidities: Hypertension, hyperlipidemia, OA. She understands the benefits of wt loss as well as adverse effects of obesity. Consistency with healthy diet and physical activity encouraged.    Return in 6 months (on 08/29/2023) for chronic problems.  Leah Skora G. [place], MD  [facility]. [place] office.

## 2023-02-27 ENCOUNTER — Encounter: Payer: Self-pay | Admitting: Family Medicine

## 2023-02-27 ENCOUNTER — Ambulatory Visit (INDEPENDENT_AMBULATORY_CARE_PROVIDER_SITE_OTHER): Payer: BC Managed Care – PPO | Admitting: Family Medicine

## 2023-02-27 VITALS — BP 124/80 | HR 82 | Temp 97.9°F | Resp 16 | Ht 62.99 in | Wt 210.4 lb

## 2023-02-27 DIAGNOSIS — C8105 Nodular lymphocyte predominant Hodgkin lymphoma, lymph nodes of inguinal region and lower limb: Secondary | ICD-10-CM | POA: Diagnosis not present

## 2023-02-27 DIAGNOSIS — E785 Hyperlipidemia, unspecified: Secondary | ICD-10-CM

## 2023-02-27 DIAGNOSIS — Z23 Encounter for immunization: Secondary | ICD-10-CM | POA: Diagnosis not present

## 2023-02-27 DIAGNOSIS — Z0000 Encounter for general adult medical examination without abnormal findings: Secondary | ICD-10-CM | POA: Diagnosis not present

## 2023-02-27 DIAGNOSIS — E1169 Type 2 diabetes mellitus with other specified complication: Secondary | ICD-10-CM | POA: Diagnosis not present

## 2023-02-27 DIAGNOSIS — I10 Essential (primary) hypertension: Secondary | ICD-10-CM | POA: Diagnosis not present

## 2023-02-27 LAB — COMPREHENSIVE METABOLIC PANEL
ALT: 18 U/L (ref 0–35)
AST: 20 U/L (ref 0–37)
Albumin: 4.2 g/dL (ref 3.5–5.2)
Alkaline Phosphatase: 82 U/L (ref 39–117)
BUN: 17 mg/dL (ref 6–23)
CO2: 29 mEq/L (ref 19–32)
Calcium: 9.9 mg/dL (ref 8.4–10.5)
Chloride: 99 mEq/L (ref 96–112)
Creatinine, Ser: 0.71 mg/dL (ref 0.40–1.20)
GFR: 92.62 mL/min (ref >60.00)
Glucose, Bld: 144 mg/dL — ABNORMAL HIGH (ref 70–99)
Potassium: 3.7 mEq/L (ref 3.5–5.1)
Sodium: 137 mEq/L (ref 135–145)
Total Bilirubin: 0.5 mg/dL (ref 0.2–1.2)
Total Protein: 7.5 g/dL (ref 6.0–8.3)

## 2023-02-27 LAB — LIPID PANEL
Cholesterol: 211 mg/dL — ABNORMAL HIGH (ref 0–200)
HDL: 63.8 mg/dL (ref >39.00)
LDL Cholesterol: 129 mg/dL — ABNORMAL HIGH (ref 0–99)
NonHDL: 146.92
Total CHOL/HDL Ratio: 3
Triglycerides: 89 mg/dL (ref 0.0–149.0)
VLDL: 17.8 mg/dL (ref 0.0–40.0)

## 2023-02-27 LAB — HEMOGLOBIN A1C: Hgb A1c MFr Bld: 6.7 % — ABNORMAL HIGH (ref 4.6–6.5)

## 2023-02-27 LAB — MICROALBUMIN / CREATININE URINE RATIO
Creatinine,U: 147.1 mg/dL
Microalb Creat Ratio: 0.5 mg/g (ref 0.0–30.0)
Microalb, Ur: <0.7 mg/dL (ref 0.0–1.9)

## 2023-02-27 MED ORDER — PRAVASTATIN SODIUM 20 MG PO TABS
20.0000 mg | ORAL_TABLET | Freq: Every day | ORAL | 2 refills | Status: DC
Start: 2023-02-27 — End: 2024-02-16

## 2023-02-27 NOTE — Assessment & Plan Note (Signed)
Continue Pravastatin 10 mg daily and low fat diet. Further recommendations according to FLP result. 

## 2023-02-27 NOTE — Patient Instructions (Addendum)
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  If you need refills for medications you take chronically, please call your pharmacy. Do not use My Chart to request refills or for acute issues that need immediate attention. If you send a my chart message, it may take a few days to be addressed, specially if I am not in the office.  Please be sure medication list is accurate. If a new problem present, please set up appointment sooner than planned today.  Health Maintenance, Female Adopting a healthy lifestyle and getting preventive care are important in promoting health and wellness. Ask your health care provider about: The right schedule for you to have regular tests and exams. Things you can do on your own to prevent diseases and keep yourself healthy. What should I know about diet, weight, and exercise? Eat a healthy diet  Eat a diet that includes plenty of vegetables, fruits, low-fat dairy products, and lean protein. Do not eat a lot of foods that are high in solid fats, added sugars, or sodium. Maintain a healthy weight Body mass index (BMI) is used to identify weight problems. It estimates body fat based on height and weight. Your health care provider can help determine your BMI and help you achieve or maintain a healthy weight. Get regular exercise Get regular exercise. This is one of the most important things you can do for your health. Most adults should: Exercise for at least 150 minutes each week. The  exercise should increase your heart rate and make you sweat (moderate-intensity exercise). Do strengthening exercises at least twice a week. This is in addition to the moderate-intensity exercise. Spend less time sitting. Even light physical activity can be beneficial. Watch cholesterol and blood lipids Have your blood tested for lipids and cholesterol at 60 years of age, then have this test every 5 years. Have your cholesterol levels checked more often if: Your lipid or cholesterol levels are high. You are older than 60 years of age. You are at high risk for heart disease. What should I know about cancer screening? Depending on your health history and family history, you may need to have cancer screening at various ages. This may include screening for: Breast cancer. Cervical cancer. Colorectal cancer. Skin cancer. Lung cancer. What should I know about heart disease, diabetes, and high blood pressure? Blood pressure and heart disease High blood pressure causes heart disease and increases the risk of stroke. This is more likely to develop in people who have high blood pressure readings or are overweight. Have your blood pressure checked: Every 3-5 years if you are 58-74 years of age. Every year if you are 42 years old or older. Diabetes Have regular diabetes screenings. This checks your fasting blood sugar level. Have the screening done: Once every three years after age 24 if you are at a normal weight and have a low risk for diabetes. More often and at a younger age if you are overweight or have a high risk for diabetes. What should I know about preventing infection? Hepatitis B If you have a higher risk for hepatitis  B, you should be screened for this virus. Talk with your health care provider to find out if you are at risk for hepatitis B infection. Hepatitis C Testing is recommended for: Everyone born from [birth year] through 1963. Anyone with known risk factors for hepatitis  C. Sexually transmitted infections (STIs) Get screened for STIs, including gonorrhea and chlamydia, if: You are sexually active and are younger than 60 years of age. You are older than 60 years of age and your health care provider tells you that you are at risk for this type of infection. Your sexual activity has changed since you were last screened, and you are at increased risk for chlamydia or gonorrhea. Ask your health care provider if you are at risk. Ask your health care provider about whether you are at high risk for HIV. Your health care provider may recommend a prescription medicine to help prevent HIV infection. If you choose to take medicine to prevent HIV, you should first get tested for HIV. You should then be tested every 3 months for as long as you are taking the medicine. Pregnancy If you are about to stop having your period (premenopausal) and you may become pregnant, seek counseling before you get pregnant. Take 400 to 800 micrograms (mcg) of folic acid every day if you become pregnant. Ask for birth control (contraception) if you want to prevent pregnancy. Osteoporosis and menopause Osteoporosis is a disease in which the bones lose minerals and strength with aging. This can result in bone fractures. If you are 60 years old or older, or if you are at risk for osteoporosis and fractures, ask your health care provider if you should: Be screened for bone loss. Take a calcium or vitamin D supplement to lower your risk of fractures. Be given hormone replacement therapy (HRT) to treat symptoms of menopause. Follow these instructions at home: Alcohol use Do not drink alcohol if: Your health care provider tells you not to drink. You are pregnant, may be pregnant, or are planning to become pregnant. If you drink alcohol: Limit how much you have to: 0-1 drink a day. Know how much alcohol is in your drink. In the [place] one drink equals one 12 oz bottle of beer (355 mL), one 5 oz glass  of wine (148 mL), or one 1 oz glass of hard liquor (44 mL). Lifestyle Do not use any products that contain nicotine or tobacco. These products include cigarettes, chewing tobacco, and vaping devices, such as e-cigarettes. If you need help quitting, ask your health care provider. Do not use street drugs. Do not share needles. Ask your health care provider for help if you need support or information about quitting drugs. General instructions Schedule regular health, dental, and eye exams. Stay current with your vaccines. Tell your health care provider if: You often feel depressed. You have ever been abused or do not feel safe at home. Summary Adopting a healthy lifestyle and getting preventive care are important in promoting health and wellness. Follow your health care provider's instructions about healthy diet, exercising, and getting tested or screened for diseases. Follow your health care provider's instructions on monitoring your cholesterol and blood pressure. This information is not intended to replace advice given to you by your health care provider. Make sure you discuss any questions you have with your health care provider. Document Revised: 01/25/2021 Document Reviewed: 01/25/2021 Elsevier Patient Education  2024 [employer].

## 2023-02-27 NOTE — Assessment & Plan Note (Signed)
We discussed the importance of regular physical activity and healthy diet for prevention of chronic illness and/or complications. Preventive guidelines reviewed. Vaccination updated, pneumovax given today. Ca++ and vit D supplementation to continue. Continue female preventive care with her gyn. Next CPE in a year.

## 2023-02-27 NOTE — Assessment & Plan Note (Signed)
BP adequately controlled. Continue Lisinopril 10 mg daily and HCTZ 25 mg daily and low salt diet. Due for eye exam.

## 2023-02-27 NOTE — Assessment & Plan Note (Signed)
Follows with oncologist regularly, next appt 03/17/23.

## 2023-02-27 NOTE — Assessment & Plan Note (Signed)
HgA1C has been at goal. Continue non pharmacologic treatment. Annual eye exam (overdue), periodic dental and foot care recommended. F/U in 6 months.

## 2023-02-27 NOTE — Assessment & Plan Note (Addendum)
Comorbidities: Hypertension, hyperlipidemia, OA. She understands the benefits of wt loss as well as adverse effects of obesity. Consistency with healthy diet and physical activity encouraged.

## 2023-02-28 ENCOUNTER — Encounter: Payer: Self-pay | Admitting: Orthopaedic Surgery

## 2023-02-28 ENCOUNTER — Other Ambulatory Visit (INDEPENDENT_AMBULATORY_CARE_PROVIDER_SITE_OTHER): Payer: BC Managed Care – PPO

## 2023-02-28 ENCOUNTER — Telehealth: Payer: Self-pay

## 2023-02-28 ENCOUNTER — Ambulatory Visit (INDEPENDENT_AMBULATORY_CARE_PROVIDER_SITE_OTHER): Payer: BC Managed Care – PPO | Admitting: Orthopaedic Surgery

## 2023-02-28 VITALS — Ht 62.99 in | Wt 213.0 lb

## 2023-02-28 DIAGNOSIS — M25531 Pain in right wrist: Secondary | ICD-10-CM

## 2023-02-28 DIAGNOSIS — M25561 Pain in right knee: Secondary | ICD-10-CM | POA: Diagnosis not present

## 2023-02-28 MED ORDER — CELECOXIB 200 MG PO CAPS: 200.0000 mg | ORAL_CAPSULE | Freq: Two times a day (BID) | ORAL | 3 refills | Status: DC

## 2023-02-28 NOTE — Telephone Encounter (Signed)
Please precert for right knee visco. Dr.Xu's patient.

## 2023-02-28 NOTE — Telephone Encounter (Signed)
VOB submitted for Orthovisc, right knee   

## 2023-02-28 NOTE — Progress Notes (Signed)
Office Visit Note   Patient: Whitney Santiago           Date of Birth: Sep 02, 1961           MRN: [id number] Visit Date: 02/28/2023              Requested by: [place], Betty G, MD [address] [place],  [place] [address] PCP: [place], Betty G, MD   Assessment & Plan: Visit Diagnoses:  1. Acute pain of right knee   2. 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  Treatment options were discussed for both conditions and patient would like to try Celebrex as well as get authorization for viscosupplementation.  For the wrist we will immobilize with a thumb spica brace.  She will follow-up if symptoms not improved.  Follow-Up Instructions: No follow-ups on file.   Orders:  Orders Placed This Encounter  Procedures   XR KNEE 3 VIEW RIGHT   XR Wrist Complete Right   No orders of the defined types were placed in this encounter.     Procedures: No procedures performed   Clinical Data: No additional findings.   Subjective: Chief Complaint  Patient presents with   Right Knee - Pain   Right Wrist - Pain    HPI Whitney Santiago is a 62 year old female here for evaluation of right knee pain and right wrist pain.  She has had chronic right knee pain for about a year.  She has had 2 cortisone injections but she feels like she had some side effects from them.  She has pain at nighttime as well.  This affects her ADLs.  In regards to the right wrist she started having pain on the radial side starting Saturday.  Denies any injuries.  Tylenol provides no relief. Review of Systems  Constitutional: Negative.   HENT: Negative.    Eyes: Negative.   Respiratory: Negative.    Cardiovascular: Negative.   Endocrine: Negative.   Musculoskeletal: Negative.   Neurological: Negative.   Hematological: Negative.   Psychiatric/Behavioral: Negative.    All other systems reviewed and are  negative.    Objective: Vital Signs: Ht 5' 2.99" (1.6 m)   Wt 213 lb (96.6 kg)   LMP 10/19/1998 (Approximate)   BMI 37.74 kg/m   Physical Exam Vitals and nursing note reviewed.  Constitutional:      Appearance: She is well-developed.  HENT:     Head: Atraumatic.     Nose: Nose normal.  Eyes:     Extraocular Movements: Extraocular movements intact.  Cardiovascular:     Pulses: Normal pulses.  Pulmonary:     Effort: Pulmonary effort is normal.  Abdominal:     Palpations: Abdomen is soft.  Musculoskeletal:     Cervical back: Neck supple.  Skin:    General: Skin is warm.     Capillary Refill: Capillary refill takes less than 2 seconds.  Neurological:     Mental Status: She is alert. Mental status is at baseline.  Psychiatric:        Behavior: Behavior normal.        Thought Content: Thought content normal.        Judgment: Judgment normal.    Ortho Exam Examination of right wrist shows tenderness to the radial styloid and mildly positive Finkelstein sign.  Negative thumb CMC grind test.  Examination right knee shows medial joint line tenderness.  Trace effusion.  Pain and crepitus throughout range of motion.  Collateral cruciates are stable. Specialty Comments:  No specialty comments available.  Imaging: No results found.   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 05/24/2022   Fibroid    History of migraine    Hx - last migraine 2 yrs ago, no longer a problem   HLD (hyperlipidemia)    Hodgkin disease (HCC) 1999   lymphocytic predominant Hodkin's 2000, s/p 4 weeks Rituxan, chlorambucil 10/21/99-04/19/00   Hx of iron deficiency anemia    Hx -per [facility] notes, since childhood, possible thalassemia   Hypertension    LAD (lymphadenopathy), axillary    eval with onc and [facility] 2016   Obesity    Pre-diabetes    HgA1c 6.4 on 01/24/2022    Family History  Problem Relation Age of Onset   Hypertension Mother    Stroke Mother 5   Hypertension Father    Diabetes Father    Hypertension Sister    Liver disease Sister        related to infected appendix   Diabetes Sister    Breast cancer Sister    Hypertension Sister    Diabetes Sister    Hypertension Brother    Diabetes Brother    Hypertension Daughter    Colon cancer Neg Hx    Ovarian cancer Neg Hx    Endometrial cancer Neg Hx    Pancreatic cancer Neg Hx    Prostate cancer Neg Hx     Past Surgical History:  Procedure Laterality Date   AXILLARY LYMPH NODE BIOPSY Right 06/09/2016   Procedure: AXILLARY LYMPH NODE BIOPSY;  Surgeon: Avel Peace, MD;  Location: [facility] OR;  Service: General;  Laterality: Right;   BREAST EXCISIONAL BIOPSY Left 2006   BREAST SURGERY Left 2007   due to bloody discharge   COLONOSCOPY     DILATATION & CURETTAGE/HYSTEROSCOPY WITH MYOSURE N/A 05/24/2016   Procedure: DILATATION & CURETTAGE/HYSTEROSCOPY WITH MYOSURE;  Surgeon: Romualdo Bolk, MD;  Location: [facility];  Service: Gynecology;  Laterality: N/A;   DILATATION & CURETTAGE/HYSTEROSCOPY WITH MYOSURE N/A 06/07/2022   Procedure: DILATATION & CURETTAGE/HYSTEROSCOPY WITH MYOSURE;  Surgeon: Romualdo Bolk, MD;  Location: [facility];  Service: Gynecology;  Laterality: N/A;   LAPAROSCOPIC LYSIS OF ADHESIONS  06/07/2022    Procedure: LAPAROSCOPIC LYSIS OF ADHESIONS;  Surgeon: Romualdo Bolk, MD;  Location: [facility];  Service: Gynecology;;   LAPAROSCOPIC REMOVAL ABDOMINAL MASS  2000   LLQ in the setting of Hodgkins   LAPAROSCOPIC SALPINGO OOPHERECTOMY Bilateral 06/07/2022   Procedure: DIAGNOSTIC LAPAROSCOPY/ PELVIC WASHING;  Surgeon: Romualdo Bolk, MD;  Location: [facility];  Service: Gynecology;  Laterality: Bilateral;   ROBOTIC ASSISTED TOTAL HYSTERECTOMY WITH BILATERAL SALPINGO OOPHERECTOMY  Bilateral 07/14/2022   Procedure: XI ROBOTIC ASSISTED TOTAL HYSTERECTOMY WITH BILATERAL SALPINGO OOPHORECTOMY,  MINI LAPAROTOMY,CYSTOSCOPY;  Surgeon: Carver Fila, MD;  Location: [facility];  Service: Gynecology;  Laterality: Bilateral;   SENTINEL NODE BIOPSY N/A 07/14/2022   Procedure: SENTINEL NODE BIOPSY;  Surgeon: Carver Fila, MD;  Location: [facility];  Service: Gynecology;  Laterality: N/A;   Social History   Occupational History   Occupation: works at group home; 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

## 2023-03-01 ENCOUNTER — Other Ambulatory Visit: Payer: Self-pay

## 2023-03-01 MED ORDER — METFORMIN HCL 500 MG PO TABS: 500.0000 mg | ORAL_TABLET | Freq: Two times a day (BID) | ORAL | 3 refills | Status: DC

## 2023-03-07 ENCOUNTER — Ambulatory Visit
Admission: RE | Admit: 2023-03-07 | Discharge: 2023-03-07 | Disposition: A | Discharge status: Home / Self Care | Payer: BC Managed Care – PPO | Source: Ambulatory Visit | Attending: Family Medicine | Admitting: Family Medicine

## 2023-03-07 DIAGNOSIS — Z1231 Encounter for screening mammogram for malignant neoplasm of breast: Secondary | ICD-10-CM | POA: Diagnosis not present

## 2023-03-10 ENCOUNTER — Telehealth: Payer: Self-pay

## 2023-03-10 NOTE — Telephone Encounter (Signed)
Called and left a VM for patient to CB concerning gel injection not being covered under her medical plan and to discuss TriVisc.

## 2023-03-15 ENCOUNTER — Other Ambulatory Visit: Payer: Self-pay | Admitting: Family

## 2023-03-15 DIAGNOSIS — C8103 Nodular lymphocyte predominant Hodgkin lymphoma, intra-abdominal lymph nodes: Secondary | ICD-10-CM

## 2023-03-17 ENCOUNTER — Ambulatory Visit: Payer: BC Managed Care – PPO | Admitting: Oncology

## 2023-03-17 ENCOUNTER — Inpatient Hospital Stay: Payer: BC Managed Care – PPO | Attending: Hematology & Oncology

## 2023-03-17 ENCOUNTER — Inpatient Hospital Stay (HOSPITAL_BASED_OUTPATIENT_CLINIC_OR_DEPARTMENT_OTHER): Payer: BC Managed Care – PPO | Admitting: Family

## 2023-03-17 ENCOUNTER — Other Ambulatory Visit: Payer: BC Managed Care – PPO

## 2023-03-17 ENCOUNTER — Encounter: Payer: Self-pay | Admitting: Family

## 2023-03-17 VITALS — BP 127/60 | HR 86 | Temp 98.3°F | Resp 18 | Wt 212.0 lb

## 2023-03-17 DIAGNOSIS — C8194 Hodgkin lymphoma, unspecified, lymph nodes of axilla and upper limb: Secondary | ICD-10-CM | POA: Insufficient documentation

## 2023-03-17 DIAGNOSIS — Z803 Family history of malignant neoplasm of breast: Secondary | ICD-10-CM | POA: Diagnosis not present

## 2023-03-17 DIAGNOSIS — C8103 Nodular lymphocyte predominant Hodgkin lymphoma, intra-abdominal lymph nodes: Secondary | ICD-10-CM

## 2023-03-17 LAB — CMP (CANCER CENTER ONLY)
ALT: 15 U/L (ref 0–44)
AST: 17 U/L (ref 15–41)
Albumin: 4.5 g/dL (ref 3.5–5.0)
Alkaline Phosphatase: 78 U/L (ref 38–126)
Anion gap: 8 (ref 5–15)
BUN: 17 mg/dL (ref 6–20)
CO2: 30 mmol/L (ref 22–32)
Calcium: 10.4 mg/dL — ABNORMAL HIGH (ref 8.9–10.3)
Chloride: 101 mmol/L (ref 98–111)
Creatinine: 0.77 mg/dL (ref 0.44–1.00)
GFR, Estimated: >60 mL/min (ref >60)
Glucose, Bld: 95 mg/dL (ref 70–99)
Potassium: 3.5 mmol/L (ref 3.5–5.1)
Sodium: 139 mmol/L (ref 135–145)
Total Bilirubin: 0.3 mg/dL (ref 0.3–1.2)
Total Protein: 7.9 g/dL (ref 6.5–8.1)

## 2023-03-17 LAB — CBC WITH DIFFERENTIAL (CANCER CENTER ONLY)
Abs Immature Granulocytes: 0.04 10*3/uL (ref 0.00–0.07)
Basophils Absolute: 0.1 10*3/uL (ref 0.0–0.1)
Basophils Relative: 1 %
Eosinophils Absolute: 0.1 10*3/uL (ref 0.0–0.5)
Eosinophils Relative: 1 %
HCT: 37.6 % (ref 36.0–46.0)
Hemoglobin: 12 g/dL (ref 12.0–15.0)
Immature Granulocytes: 0 %
Lymphocytes Relative: 39 %
Lymphs Abs: 3.7 10*3/uL (ref 0.7–4.0)
MCH: 27.5 pg (ref 26.0–34.0)
MCHC: 31.9 g/dL (ref 30.0–36.0)
MCV: 86.2 fL (ref 80.0–100.0)
Monocytes Absolute: 0.7 10*3/uL (ref 0.1–1.0)
Monocytes Relative: 7 %
Neutro Abs: 5.1 10*3/uL (ref 1.7–7.7)
Neutrophils Relative %: 52 %
Platelet Count: 388 10*3/uL (ref 150–400)
RBC: 4.36 MIL/uL (ref 3.87–5.11)
RDW: 14.3 % (ref 11.5–15.5)
WBC Count: 9.7 10*3/uL (ref 4.0–10.5)
nRBC: 0 % (ref 0.0–0.2)

## 2023-03-17 LAB — LACTATE DEHYDROGENASE: LDH: 149 U/L (ref 98–192)

## 2023-03-17 NOTE — Progress Notes (Unsigned)
Hematology/Oncology Consultation   Name: Whitney Santiago      MRN: [id number]    Location: Room/bed info not found  Date: 03/17/2023 Time:3:16 PM   REFERRING PHYSICIAN: Previous patient of Dr. Clelia Croft  REASON FOR CONSULT: History of classical Hodgkin's lymphoma    DIAGNOSIS:  History of classical Hodgkin's lymphoma diagnosed originally in 2001 Recurrent disease with axillary lymph node involvement with indolent course in 2017 and has not required treatment.   HISTORY OF PRESENT ILLNESS: Whitney Santiago is a very pleasant 62 yo female with history of classical Hodgkin's lymphoma originally diagnosed in 2001 and treated at [facility] with Rituximab concomitantly with Chlorambucil orally. She remained disease free until she developed recurrent disease with axillary lymph node involvement with indolent course in 2017. She was being followed annually with scans by Dr. Venda Rodes who has since left [facility]. She is here today to establish care with our office to continue observation.  CT of the abdomen and pelvis follow-up by US of the pelvis in 03/2022 were stable.  She continues to do well and has no complaints at this time.  No adenopathy or lymphedema noted on today's exam.  No known familial history of cancer.  No abnormal hot flashes or night sweats.  She has type II diabetes and is currently on Metformin.  No history of thyroid disease.  She denies any issues with frequent or recurrent infection. No fever, chills, n/v, cough, rash, dizziness, SOB, chest pain, palpitations, abdominal pain or changes in bowel or bladder habits.  No swelling, tenderness, numbness or tingling in her extremities.  No falls or syncope.  Appetite and hydration are good. Weight is stable at 212 lbs.  No smoking, ETOH or recreational drug use.   ROS: All other 10 point review of systems is negative.   PAST MEDICAL HISTORY:   Past Medical History:  Diagnosis Date   Anxiety    no med   Arthritis    in both knees has received  injections in the past. 05/24/2022   Fibroid    History of migraine    Hx - last migraine 2 yrs ago, no longer a problem   HLD (hyperlipidemia)    Hodgkin disease (HCC) 1999   lymphocytic predominant Hodkin's 2000, s/p 4 weeks Rituxan, chlorambucil 10/21/99-04/19/00   Hx of iron deficiency anemia    Hx -per [facility] notes, since childhood, possible thalassemia   Hypertension    LAD (lymphadenopathy), axillary    eval with onc and [facility]   Obesity    Pre-diabetes    HgA1c 6.4 on 01/24/2022    ALLERGIES: Allergies  Allergen Reactions   Hydrocodone Hives   Oxycodone Hives   Aspirin     Upset stomach   Rosuvastatin     Muscle aches      MEDICATIONS:  Current Outpatient Medications on File Prior to Visit  Medication Sig Dispense Refill   acetaminophen (TYLENOL) 500 MG tablet Take 2 tablets (1,000 mg total) by mouth every 6 (six) hours as needed. 100 tablet 2   Calcium Carb-Cholecalciferol (CALCIUM + D3 PO) Take 1 tablet by mouth daily.      celecoxib (CELEBREX) 200 MG capsule Take 1 capsule (200 mg total) by mouth 2 (two) times daily. 30 capsule 3   cyclobenzaprine (FLEXERIL) 10 MG tablet Take 1 tablet (10 mg total) by mouth 2 (two) times daily as needed for muscle spasms. 20 tablet 0   hydrochlorothiazide (HYDRODIURIL) 25 MG tablet TAKE 1 TABLET (25 MG TOTAL) BY MOUTH DAILY.  90 tablet 1   lisinopril (ZESTRIL) 10 MG tablet TAKE 1 TABLET BY MOUTH EVERY DAY 90 tablet 1   metFORMIN (GLUCOPHAGE) 500 MG tablet Take 1 tablet (500 mg total) by mouth 2 (two) times daily with a meal. 180 tablet 3   pravastatin (PRAVACHOL) 20 MG tablet Take 1 tablet (20 mg total) by mouth daily. 90 tablet 2   No current facility-administered medications on file prior to visit.     PAST SURGICAL HISTORY Past Surgical History:  Procedure Laterality Date   AXILLARY LYMPH NODE BIOPSY Right 06/09/2016   Procedure: AXILLARY LYMPH NODE BIOPSY;  Surgeon: Avel Peace, MD;  Location: [facility] OR;  Service: General;   Laterality: Right;   BREAST EXCISIONAL BIOPSY Left 2006   BREAST SURGERY Left 2007   due to bloody discharge   COLONOSCOPY     DILATATION & CURETTAGE/HYSTEROSCOPY WITH MYOSURE N/A 05/24/2016   Procedure: DILATATION & CURETTAGE/HYSTEROSCOPY WITH MYOSURE;  Surgeon: Romualdo Bolk, MD;  Location: [facility];  Service: Gynecology;  Laterality: N/A;   DILATATION & CURETTAGE/HYSTEROSCOPY WITH MYOSURE N/A 06/07/2022   Procedure: DILATATION & CURETTAGE/HYSTEROSCOPY WITH MYOSURE;  Surgeon: Romualdo Bolk, MD;  Location: [facility];  Service: Gynecology;  Laterality: N/A;   LAPAROSCOPIC LYSIS OF ADHESIONS  06/07/2022   Procedure: LAPAROSCOPIC LYSIS OF ADHESIONS;  Surgeon: Romualdo Bolk, MD;  Location[facility] ;  Service: Gynecology;;   LAPAROSCOPIC REMOVAL ABDOMINAL MASS  2000   LLQ in the setting of Hodgkins   LAPAROSCOPIC SALPINGO OOPHERECTOMY Bilateral 06/07/2022   Procedure: DIAGNOSTIC LAPAROSCOPY/ PELVIC WASHING;  Surgeon: Romualdo Bolk, MD;  Location: [facility];  Service: Gynecology;  Laterality: Bilateral;   ROBOTIC ASSISTED TOTAL HYSTERECTOMY WITH BILATERAL SALPINGO OOPHERECTOMY Bilateral 07/14/2022   Procedure: XI ROBOTIC ASSISTED TOTAL HYSTERECTOMY WITH BILATERAL SALPINGO OOPHORECTOMY,  MINI LAPAROTOMY,CYSTOSCOPY;  Surgeon: Carver Fila, MD;  Location: [facility];  Service: Gynecology;  Laterality: Bilateral;   SENTINEL NODE BIOPSY N/A 07/14/2022   Procedure: SENTINEL NODE BIOPSY;  Surgeon: Carver Fila, MD;  Location: [facility];  Service: Gynecology;  Laterality: N/A;    FAMILY HISTORY: Family History  Problem Relation Age of Onset   Hypertension Mother    Stroke Mother 41   Hypertension Father    Diabetes Father    Hypertension Sister    Liver disease Sister        related to infected appendix   Diabetes Sister    Breast cancer Sister    Hypertension Sister    Diabetes Sister    Hypertension Brother     Diabetes Brother    Hypertension Daughter    Colon cancer Neg Hx    Ovarian cancer Neg Hx    Endometrial cancer Neg Hx    Pancreatic cancer Neg Hx    Prostate cancer Neg Hx     SOCIAL HISTORY:  reports that she has never smoked. She has never used smokeless tobacco. She reports that she does not drink alcohol and does not use drugs.  PERFORMANCE STATUS: The patient's performance status is 0 - Asymptomatic  PHYSICAL EXAM: Most Recent Vital Signs: Blood pressure 127/60, pulse 86, temperature 98.3 F (36.8 C), temperature source Oral, resp. rate 18, weight 212 lb 0.6 oz (96.2 kg), last menstrual period 10/19/1998, SpO2 100 %. BP 127/60 (BP Location: Right Arm, Patient Position: Sitting)   Pulse 86   Temp 98.3 F (36.8 C) (Oral)   Resp 18   Wt 212 lb 0.6 oz (96.2  kg)   LMP 10/19/1998 (Approximate)   SpO2 100%   BMI 37.57 kg/m   General Appearance:    Alert, cooperative, no distress, appears stated age  Head:    Normocephalic, without obvious abnormality, atraumatic  Eyes:    PERRL, conjunctiva/corneas clear, EOM's intact, fundi    benign, both eyes        Throat:   Lips, mucosa, and tongue normal; teeth and gums normal  Neck:   Supple, symmetrical, trachea midline, no adenopathy;    thyroid:  no enlargement/tenderness/nodules; no carotid   bruit or JVD  Back:     Symmetric, no curvature, ROM normal, no CVA tenderness  Lungs:     Clear to auscultation bilaterally, respirations unlabored  Chest Wall:    No tenderness or deformity   Heart:    Regular rate and rhythm, S1 and S2 normal, no murmur, rub   or gallop     Abdomen:     Soft, non-tender, bowel sounds active all four quadrants,    no masses, no organomegaly        Extremities:   Extremities normal, atraumatic, no cyanosis or edema  Pulses:   2+ and symmetric all extremities  Skin:   Skin color, texture, turgor normal, no rashes or lesions  Lymph nodes:   Cervical, supraclavicular, and axillary nodes normal   Neurologic:   CNII-XII intact, normal strength, sensation and reflexes    throughout    LABORATORY DATA:  Results for orders placed or performed in visit on 03/17/23 (from the past 48 hour(s))  CBC with Differential (Cancer Center Only)     Status: None   Collection Time: 03/17/23  2:25 PM  Result Value Ref Range   WBC Count 9.7 4.0 - 10.5 K/uL   RBC 4.36 3.87 - 5.11 MIL/uL   Hemoglobin 12.0 12.0 - 15.0 g/dL   HCT 16.1 09.6 - 04.5 %   MCV 86.2 80.0 - 100.0 fL   MCH 27.5 26.0 - 34.0 pg   MCHC 31.9 30.0 - 36.0 g/dL   RDW 40.9 81.1 - 91.4 %   Platelet Count 388 150 - 400 K/uL   nRBC 0.0 0.0 - 0.2 %   Neutrophils Relative % 52 %   Neutro Abs 5.1 1.7 - 7.7 K/uL   Lymphocytes Relative 39 %   Lymphs Abs 3.7 0.7 - 4.0 K/uL   Monocytes Relative 7 %   Monocytes Absolute 0.7 0.1 - 1.0 K/uL   Eosinophils Relative 1 %   Eosinophils Absolute 0.1 0.0 - 0.5 K/uL   Basophils Relative 1 %   Basophils Absolute 0.1 0.0 - 0.1 K/uL   Immature Granulocytes 0 %   Abs Immature Granulocytes 0.04 0.00 - 0.07 K/uL    Comment: Performed at [facility] at [facility], [address] [place], [place] [address]  CMP (Cancer Center only)     Status: Abnormal   Collection Time: 03/17/23  2:25 PM  Result Value Ref Range   Sodium 139 135 - 145 mmol/L   Potassium 3.5 3.5 - 5.1 mmol/L   Chloride 101 98 - 111 mmol/L   CO2 30 22 - 32 mmol/L   Glucose, Bld 95 70 - 99 mg/dL    Comment: Glucose reference range applies only to samples taken after fasting for at least 8 hours.   BUN 17 6 - 20 mg/dL   Creatinine 6.21 3.08 - 1.00 mg/dL   Calcium 65.7 (H) 8.9 - 10.3 mg/dL  Total Protein 7.9 6.5 - 8.1 g/dL   Albumin 4.5 3.5 - 5.0 g/dL   AST 17 15 - 41 U/L   ALT 15 0 - 44 U/L   Alkaline Phosphatase 78 38 - 126 U/L   Total Bilirubin 0.3 0.3 - 1.2 mg/dL   GFR, Estimated >09 >32 mL/min    Comment: (NOTE) Calculated using the CKD-EPI Creatinine Equation (2021)    Anion gap 8 5 - 15     Comment: Performed at [facility] at [facility], [address], [place], [place] [address]      RADIOGRAPHY: No results found.     PATHOLOGY: None  ASSESSMENT/PLAN: Whitney Santiago is a very pleasant 62 yo female with history of classical Hodgkin's lymphoma originally diagnosed in 2001 and treated at [facility]. She remained disease free until she developed recurrent disease with axillary lymph node involvement with indolent course in 2017. She continues to do well and has no complaints at this time.  We will repeat annual surveillance scans and plan to see her back for follow-up in 1 year unless scans indicate the need for more frequent visit.    All questions were answered. The patient knows to call the clinic with any problems, questions or concerns. We can certainly see the patient much sooner if necessary.  The patient was discussed with Dr. Myna Hidalgo and he is in agreement with the aforementioned.   Eileen Stanford, NP

## 2023-03-20 ENCOUNTER — Telehealth (HOSPITAL_BASED_OUTPATIENT_CLINIC_OR_DEPARTMENT_OTHER): Payer: Self-pay

## 2023-03-24 ENCOUNTER — Telehealth (HOSPITAL_BASED_OUTPATIENT_CLINIC_OR_DEPARTMENT_OTHER): Payer: Self-pay

## 2023-04-01 ENCOUNTER — Ambulatory Visit (HOSPITAL_BASED_OUTPATIENT_CLINIC_OR_DEPARTMENT_OTHER)
Admission: RE | Admit: 2023-04-01 | Discharge: 2023-04-01 | Disposition: A | Discharge status: Home / Self Care | Payer: BC Managed Care – PPO | Source: Ambulatory Visit | Attending: Family | Admitting: Family

## 2023-04-01 ENCOUNTER — Encounter (HOSPITAL_BASED_OUTPATIENT_CLINIC_OR_DEPARTMENT_OTHER): Payer: Self-pay

## 2023-04-01 DIAGNOSIS — C8103 Nodular lymphocyte predominant Hodgkin lymphoma, intra-abdominal lymph nodes: Secondary | ICD-10-CM | POA: Insufficient documentation

## 2023-04-01 DIAGNOSIS — R590 Localized enlarged lymph nodes: Secondary | ICD-10-CM | POA: Diagnosis not present

## 2023-04-01 MED ORDER — IOHEXOL 300 MG/ML  SOLN
100.0000 mL | Freq: Once | INTRAMUSCULAR | Status: AC | PRN
  Administered 2023-04-01: 100 mL via INTRAVENOUS

## 2023-04-05 ENCOUNTER — Other Ambulatory Visit: Payer: Self-pay | Admitting: Family

## 2023-04-05 ENCOUNTER — Telehealth: Payer: Self-pay | Admitting: Family

## 2023-04-05 DIAGNOSIS — C8103 Nodular lymphocyte predominant Hodgkin lymphoma, intra-abdominal lymph nodes: Secondary | ICD-10-CM

## 2023-04-05 NOTE — Telephone Encounter (Signed)
I was abel to speak with the patient and go over recent CT results. She was noted to have supraclavicular and bilateral axillary adenopathy consistent with history of indolent recurrent lymphoma. We will continue to follow-up  and plan to repeat her CT scan in 6 months. Patient in agreement with the plan. No questions or concerns at this time. Patient appreciative of call.

## 2023-08-16 ENCOUNTER — Other Ambulatory Visit: Payer: Self-pay | Admitting: Family Medicine

## 2023-08-21 ENCOUNTER — Telehealth (HOSPITAL_BASED_OUTPATIENT_CLINIC_OR_DEPARTMENT_OTHER): Payer: Self-pay

## 2023-08-25 ENCOUNTER — Telehealth (HOSPITAL_BASED_OUTPATIENT_CLINIC_OR_DEPARTMENT_OTHER): Payer: Self-pay

## 2023-10-06 ENCOUNTER — Ambulatory Visit (HOSPITAL_BASED_OUTPATIENT_CLINIC_OR_DEPARTMENT_OTHER)
Admission: RE | Admit: 2023-10-06 | Discharge: 2023-10-06 | Disposition: A | Discharge status: Home / Self Care | Payer: BC Managed Care – PPO | Source: Ambulatory Visit | Attending: Family | Admitting: Family

## 2023-10-06 ENCOUNTER — Encounter (HOSPITAL_BASED_OUTPATIENT_CLINIC_OR_DEPARTMENT_OTHER): Payer: Self-pay

## 2023-10-06 DIAGNOSIS — C8103 Nodular lymphocyte predominant Hodgkin lymphoma, intra-abdominal lymph nodes: Secondary | ICD-10-CM | POA: Diagnosis not present

## 2023-10-06 DIAGNOSIS — C8590 Non-Hodgkin lymphoma, unspecified, unspecified site: Secondary | ICD-10-CM | POA: Diagnosis not present

## 2023-10-06 DIAGNOSIS — Z90710 Acquired absence of both cervix and uterus: Secondary | ICD-10-CM | POA: Diagnosis not present

## 2023-10-06 DIAGNOSIS — K5730 Diverticulosis of large intestine without perforation or abscess without bleeding: Secondary | ICD-10-CM | POA: Diagnosis not present

## 2023-10-06 DIAGNOSIS — R590 Localized enlarged lymph nodes: Secondary | ICD-10-CM | POA: Diagnosis not present

## 2023-10-06 MED ORDER — IOHEXOL 300 MG/ML  SOLN
100.0000 mL | Freq: Once | INTRAMUSCULAR | Status: AC | PRN
  Administered 2023-10-06: 100 mL via INTRAVENOUS

## 2023-10-06 MED ORDER — BARIUM SULFATE 2 % PO SUSP
450.0000 mL | Freq: Once | ORAL | Status: AC
Start: 2023-10-06 — End: 2023-10-06
  Administered 2023-10-06: 900 mL via ORAL

## 2023-10-10 ENCOUNTER — Encounter: Payer: Self-pay | Admitting: *Deleted

## 2024-02-16 ENCOUNTER — Other Ambulatory Visit: Payer: Self-pay | Admitting: Family Medicine

## 2024-02-16 DIAGNOSIS — E785 Hyperlipidemia, unspecified: Secondary | ICD-10-CM

## 2024-05-17 ENCOUNTER — Other Ambulatory Visit: Payer: Self-pay | Admitting: Family Medicine

## 2024-05-31 ENCOUNTER — Other Ambulatory Visit: Payer: Self-pay | Admitting: Family Medicine

## 2024-05-31 DIAGNOSIS — Z1231 Encounter for screening mammogram for malignant neoplasm of breast: Secondary | ICD-10-CM

## 2024-06-07 ENCOUNTER — Encounter: Payer: Self-pay | Admitting: Family Medicine

## 2024-06-07 ENCOUNTER — Ambulatory Visit
Admission: RE | Admit: 2024-06-07 | Discharge: 2024-06-07 | Disposition: A | Discharge status: Home / Self Care | Source: Ambulatory Visit | Attending: Family Medicine | Admitting: Family Medicine

## 2024-06-07 ENCOUNTER — Ambulatory Visit (INDEPENDENT_AMBULATORY_CARE_PROVIDER_SITE_OTHER): Admitting: Family Medicine

## 2024-06-07 ENCOUNTER — Ambulatory Visit: Payer: Self-pay | Admitting: Family Medicine

## 2024-06-07 VITALS — BP 120/90 | HR 78 | Temp 98.4°F | Resp 16 | Ht 63.0 in | Wt 197.4 lb

## 2024-06-07 DIAGNOSIS — Z1231 Encounter for screening mammogram for malignant neoplasm of breast: Secondary | ICD-10-CM

## 2024-06-07 DIAGNOSIS — E1169 Type 2 diabetes mellitus with other specified complication: Secondary | ICD-10-CM

## 2024-06-07 DIAGNOSIS — Z7984 Long term (current) use of oral hypoglycemic drugs: Secondary | ICD-10-CM | POA: Diagnosis not present

## 2024-06-07 DIAGNOSIS — Z0000 Encounter for general adult medical examination without abnormal findings: Secondary | ICD-10-CM | POA: Diagnosis not present

## 2024-06-07 DIAGNOSIS — Z23 Encounter for immunization: Secondary | ICD-10-CM

## 2024-06-07 DIAGNOSIS — E785 Hyperlipidemia, unspecified: Secondary | ICD-10-CM | POA: Diagnosis not present

## 2024-06-07 DIAGNOSIS — I10 Essential (primary) hypertension: Secondary | ICD-10-CM

## 2024-06-07 LAB — COMPREHENSIVE METABOLIC PANEL WITH GFR
ALT: 12 U/L (ref 0–35)
AST: 17 U/L (ref 0–37)
Albumin: 4.3 g/dL (ref 3.5–5.2)
Alkaline Phosphatase: 80 U/L (ref 39–117)
BUN: 11 mg/dL (ref 6–23)
CO2: 31 meq/L (ref 19–32)
Calcium: 9.7 mg/dL (ref 8.4–10.5)
Chloride: 99 meq/L (ref 96–112)
Creatinine, Ser: 0.67 mg/dL (ref 0.40–1.20)
GFR: 94.36 mL/min (ref >60.00)
Glucose, Bld: 103 mg/dL — ABNORMAL HIGH (ref 70–99)
Potassium: 3.4 meq/L — ABNORMAL LOW (ref 3.5–5.1)
Sodium: 137 meq/L (ref 135–145)
Total Bilirubin: 0.5 mg/dL (ref 0.2–1.2)
Total Protein: 7.6 g/dL (ref 6.0–8.3)

## 2024-06-07 LAB — HEMOGLOBIN A1C: Hgb A1c MFr Bld: 6.7 % — ABNORMAL HIGH (ref 4.6–6.5)

## 2024-06-07 LAB — LIPID PANEL
Cholesterol: 193 mg/dL (ref 0–200)
HDL: 56.3 mg/dL (ref >39.00)
LDL Cholesterol: 124 mg/dL — ABNORMAL HIGH (ref 0–99)
NonHDL: 136.98
Total CHOL/HDL Ratio: 3
Triglycerides: 64 mg/dL (ref 0.0–149.0)
VLDL: 12.8 mg/dL (ref 0.0–40.0)

## 2024-06-07 LAB — MICROALBUMIN / CREATININE URINE RATIO
Creatinine,U: 47.5 mg/dL
Microalb Creat Ratio: UNDETERMINED mg/g (ref 0.0–30.0)
Microalb, Ur: <0.7 mg/dL

## 2024-06-07 MED ORDER — LISINOPRIL 10 MG PO TABS: 10.0000 mg | ORAL_TABLET | Freq: Every day | ORAL | 2 refills | Status: AC

## 2024-06-07 MED ORDER — METFORMIN HCL 500 MG PO TABS: 500.0000 mg | ORAL_TABLET | Freq: Every day | ORAL | 2 refills | Status: AC

## 2024-06-07 MED ORDER — HYDROCHLOROTHIAZIDE 25 MG PO TABS: 25.0000 mg | ORAL_TABLET | Freq: Every day | ORAL | 2 refills | Status: AC

## 2024-06-07 NOTE — Assessment & Plan Note (Signed)
 Currently on pravastatin  20 mg daily. Further recommendation will be given according to lipid panel result.

## 2024-06-07 NOTE — Assessment & Plan Note (Signed)
 Problem has been adequately controlled, last hemoglobin A1c in 02/2023 was 6.7. She is not monitoring BS. Continue metformin  500 mg daily, higher doses caused nausea and vomiting. Further recommendation will be given according to hemoglobin A1c result. Annual eye exam, periodic dental and foot care recommended. F/U in 5-6 months.

## 2024-06-07 NOTE — Patient Instructions (Addendum)
 A few things to remember from today's visit:  Routine general medical examination at a health care facility  Type 2 diabetes mellitus with other specified complication, without long-term current use of insulin (HCC) - Plan: Comprehensive metabolic panel with GFR, Hemoglobin A1c, Microalbumin/Creatinine Ratio, Urine,   Essential hypertension  Hyperlipidemia, unspecified hyperlipidemia type - Plan: Lipid panel  Need for influenza vaccination - Plan: Flu vaccine trivalent PF, 6mos and older(Flulaval,Afluria,Fluarix,Fluzone)  Monitor blood pressure at home. No changes today.  If you need refills for medications you take chronically, please call your pharmacy. Do not use My Chart to request refills or for acute issues that need immediate attention. If you send a my chart message, it may take a few days to be addressed, specially if I am not in the office.  Please be sure medication list is accurate. If a new problem present, please set up appointment sooner than planned today.  Health Maintenance, Female Adopting a healthy lifestyle and getting preventive care are important in promoting health and wellness. Ask your health care provider about: The right schedule for you to have regular tests and exams. Things you can do on your own to prevent diseases and keep yourself healthy. What should I know about diet, weight, and exercise? Eat a healthy diet  Eat a diet that includes plenty of vegetables, fruits, low-fat dairy products, and lean protein. Do not eat a lot of foods that are high in solid fats, added sugars, or sodium. Maintain a healthy weight Body mass index (BMI) is used to identify weight problems. It estimates body fat based on height and weight. Your health care provider can help determine your BMI and help you achieve or maintain a healthy weight. Get regular exercise Get regular exercise. This is one of the most important things you can do for your health. Most adults  should: Exercise for at least 150 minutes each week. The exercise should increase your heart rate and make you sweat (moderate-intensity exercise). Do strengthening exercises at least twice a week. This is in addition to the moderate-intensity exercise. Spend less time sitting. Even light physical activity can be beneficial. Watch cholesterol and blood lipids Have your blood tested for lipids and cholesterol at 61 years of age, then have this test every 5 years. Have your cholesterol levels checked more often if: Your lipid or cholesterol levels are high. You are older than 61 years of age. You are at high risk for heart disease. What should I know about cancer screening? Depending on your health history and family history, you may need to have cancer screening at various ages. This may include screening for: Breast cancer. Cervical cancer. Colorectal cancer. Skin cancer. Lung cancer. What should I know about heart disease, diabetes, and high blood pressure? Blood pressure and heart disease High blood pressure causes heart disease and increases the risk of stroke. This is more likely to develop in people who have high blood pressure readings or are overweight. Have your blood pressure checked: Every 3-5 years if you are 72-59 years of age. Every year if you are 89 years old or older. Diabetes Have regular diabetes screenings. This checks your fasting blood sugar level. Have the screening done: Once every three years after age 85 if you are at a normal weight and have a low risk for diabetes. More often and at a younger age if you are overweight or have a high risk for diabetes. What should I know about preventing infection? Hepatitis B If you have  a higher risk for hepatitis B, you should be screened for this virus. Talk with your health care provider to find out if you are at risk for hepatitis B infection. Hepatitis C Testing is recommended for: Everyone born from [birth year] through  1965. Anyone with known risk factors for hepatitis C. Sexually transmitted infections (STIs) Get screened for STIs, including gonorrhea and chlamydia, if: You are sexually active and are younger than 61 years of age. You are older than 61 years of age and your health care provider tells you that you are at risk for this type of infection. Your sexual activity has changed since you were last screened, and you are at increased risk for chlamydia or gonorrhea. Ask your health care provider if you are at risk. Ask your health care provider about whether you are at high risk for HIV. Your health care provider may recommend a prescription medicine to help prevent HIV infection. If you choose to take medicine to prevent HIV, you should first get tested for HIV. You should then be tested every 3 months for as long as you are taking the medicine. Pregnancy If you are about to stop having your period (premenopausal) and you may become pregnant, seek counseling before you get pregnant. Take 400 to 800 micrograms (mcg) of folic acid every day if you become pregnant. Ask for birth control (contraception) if you want to prevent pregnancy. Osteoporosis and menopause Osteoporosis is a disease in which the bones lose minerals and strength with aging. This can result in bone fractures. If you are 13 years old or older, or if you are at risk for osteoporosis and fractures, ask your health care provider if you should: Be screened for bone loss. Take a calcium  or vitamin D supplement to lower your risk of fractures. Be given hormone replacement therapy (HRT) to treat symptoms of menopause. Follow these instructions at home: Alcohol use Do not drink alcohol if: Your health care provider tells you not to drink. You are pregnant, may be pregnant, or are planning to become pregnant. If you drink alcohol: Limit how much you have to: 0-1 drink a day. Know how much alcohol is in your drink. In the [place] one drink  equals one 12 oz bottle of beer (355 mL), one 5 oz glass of wine (148 mL), or one 1 oz glass of hard liquor (44 mL). Lifestyle Do not use any products that contain nicotine or tobacco. These products include cigarettes, chewing tobacco, and vaping devices, such as e-cigarettes. If you need help quitting, ask your health care provider. Do not use street drugs. Do not share needles. Ask your health care provider for help if you need support or information about quitting drugs. General instructions Schedule regular health, dental, and eye exams. Stay current with your vaccines. Tell your health care provider if: You often feel depressed. You have ever been abused or do not feel safe at home. Summary Adopting a healthy lifestyle and getting preventive care are important in promoting health and wellness. Follow your health care provider's instructions about healthy diet, exercising, and getting tested or screened for diseases. Follow your health care provider's instructions on monitoring your cholesterol and blood pressure. This information is not intended to replace advice given to you by your health care provider. Make sure you discuss any questions you have with your health care provider. Document Revised: 01/25/2021 Document Reviewed: 01/25/2021 Elsevier Patient Education  2024 [employer].

## 2024-06-07 NOTE — Assessment & Plan Note (Addendum)
 We discussed the importance of regular physical activity and healthy diet for prevention of chronic illness and/or complications. Preventive guidelines reviewed. Vaccination updated. Status post hysterectomy. Next CPE in a year.

## 2024-06-07 NOTE — Progress Notes (Signed)
 Chief Complaint  Patient presents with   Annual Exam   Follow-up    Discussed the use of AI scribe software for clinical note transcription with the patient, who gave verbal consent to proceed.  History of Present Illness Whitney Santiago is a 63 year old female with past medical history significant for Hodgkin's disease in 2000, DM2, hypertension, and hyperlipidemia who presents for an annual physical exam and follow-up . Whitney Santiago was last seen on 02/27/2023.  Since her last visit Whitney Santiago has followed with oncologist.  Whitney Santiago does not engage in regular exercise but remains active through her work.  Whitney Santiago has been cooking more at home and eating vegetables daily.  Whitney Santiago does not consume alcohol or smoke, and Whitney Santiago has never smoked. Whitney Santiago has not had an eye exam in over a year and Whitney Santiago is trying to find a dentist.   Immunization History  Administered Date(s) Administered   Influenza,inj,Quad PF,6+ Mos 06/26/2014, 06/19/2015, 07/05/2016, 06/25/2018, 07/08/2019, 07/26/2021, 07/25/2022   Moderna Sars-Covid-2 Vaccination 10/02/2019, 11/09/2019, 02/18/2020   Pneumococcal Polysaccharide-23 02/27/2023   Tdap 06/26/2014   Zoster Recombinant(Shingrix) 01/15/2021, 07/26/2021   Health Maintenance  Topic Date Due   Diabetic kidney evaluation - Urine ACR  Never done   HEMOGLOBIN A1C  08/29/2023   Pneumococcal Vaccine: 50+ Years (2 of 2 - PCV) 02/27/2024   FOOT EXAM  02/27/2024   Diabetic kidney evaluation - eGFR measurement  03/16/2024   Influenza Vaccine  04/19/2024   COVID-19 Vaccine (4 - 2025-26 season) 05/20/2024   OPHTHALMOLOGY EXAM  06/07/2024 (Originally 01/16/1971)   DTaP/Tdap/Td (2 - Td or Tdap) 06/26/2024   Colonoscopy  11/27/2024   HIV Screening  Completed   Zoster Vaccines- Shingrix  Completed   Hepatitis C Screening  Addressed   Hepatitis B Vaccines 19-59 Average Risk  Aged Out   HPV VACCINES  Aged Out   Meningococcal B Vaccine  Aged Out   Mammogram  Discontinued   DM II: Diagnosed in  02/2018 with a hemoglobin A1c of 6.6. Whitney Santiago is not monitoring BS at home. Whitney Santiago is currently taking metformin  500 mg daily, but reduced her dose to once daily due to nausea and vomiting with the second dose. Whitney Santiago has not been checking her blood sugars. Negative for symptoms of hypoglycemia, polyuria, polydipsia, new numbness in extremities (has hx of right thigh numbness) , or foot ulcers/trauma.   Lab Results  Component Value Date   HGBA1C 6.7 (H) 02/27/2023    HTN: Whitney Santiago is taking lisinopril  10 mg daily and HCTZ 25 mg daily. Whitney Santiago has not been monitoring her blood pressure at home. Lab Results  Component Value Date   NA 139 03/17/2023   CL 101 03/17/2023   K 3.5 03/17/2023   CO2 30 03/17/2023   BUN 17 03/17/2023   CREATININE 0.77 03/17/2023   GFRNONAA >60 03/17/2023   CALCIUM  10.4 (H) 03/17/2023   ALBUMIN 4.5 03/17/2023   GLUCOSE 95 03/17/2023   Hypertension: Currently Whitney Santiago is on pravastatin  20 mg daily. Lab Results  Component Value Date   CHOL 211 (H) 02/27/2023   HDL 63.80 02/27/2023   LDLCALC 129 (H) 02/27/2023   TRIG 89.0 02/27/2023   CHOLHDL 3 02/27/2023   Review of Systems  Constitutional:  Negative for activity change, appetite change and fever.  HENT:  Negative for mouth sores, sore throat and trouble swallowing.   Eyes:  Negative for redness and visual disturbance.  Respiratory:  Negative for cough, shortness of breath and wheezing.   Cardiovascular:  Negative for chest pain and leg swelling.  Gastrointestinal:  Negative for abdominal pain, nausea and vomiting.  Endocrine: Negative for cold intolerance, heat intolerance, polydipsia, polyphagia and polyuria.  Genitourinary:  Negative for decreased urine volume, dysuria and hematuria.  Musculoskeletal:  Positive for arthralgias. Negative for gait problem and myalgias.  Skin:  Negative for color change and rash.  Allergic/Immunologic: Negative for environmental allergies.  Neurological:  Negative for syncope, weakness and  headaches.  Hematological:  Negative for adenopathy. Does not bruise/bleed easily.  Psychiatric/Behavioral:  Negative for confusion. The patient is not nervous/anxious.   All other systems reviewed and are negative.  Current Outpatient Medications on File Prior to Visit  Medication Sig Dispense Refill   hydrochlorothiazide  (HYDRODIURIL ) 25 MG tablet TAKE 1 TABLET (25 MG TOTAL) BY MOUTH DAILY. 90 tablet 1   lisinopril  (ZESTRIL ) 10 MG tablet TAKE 1 TABLET BY MOUTH EVERY DAY 90 tablet 1   pravastatin  (PRAVACHOL ) 20 MG tablet TAKE 1 TABLET BY MOUTH EVERY DAY 90 tablet 2   Calcium  Carb-Cholecalciferol (CALCIUM  + D3 PO) Take 1 tablet by mouth daily.  (Patient not taking: Reported on 06/07/2024)     No current facility-administered medications on file prior to visit.   Past Medical History:  Diagnosis Date   Anxiety    no med   Arthritis    in both knees has received injections in the past. 05/24/2022   Fibroid    History of migraine    Hx - last migraine 2 yrs ago, no longer a problem   HLD (hyperlipidemia)    Hodgkin disease (HCC) 1999   lymphocytic predominant Hodkin's 2000, s/p 4 weeks Rituxan, chlorambucil 10/21/99-04/19/00   Hx of iron deficiency anemia    Hx -per [facility] notes, since childhood, possible thalassemia   Hypertension    LAD (lymphadenopathy), axillary    eval with onc and [facility] 2016   Obesity    Pre-diabetes    HgA1c 6.4 on 01/24/2022    Past Surgical History:  Procedure Laterality Date   AXILLARY LYMPH NODE BIOPSY Right 06/09/2016   Procedure: AXILLARY LYMPH NODE BIOPSY;  Surgeon: Krystal Russell, MD;  Location[facility];  Service: General;  Laterality: Right;   BREAST EXCISIONAL BIOPSY Left 2006   BREAST SURGERY Left 2007   due to bloody discharge   COLONOSCOPY     DILATATION & CURETTAGE/HYSTEROSCOPY WITH MYOSURE N/A 05/24/2016   Procedure: DILATATION & CURETTAGE/HYSTEROSCOPY WITH MYOSURE;  Surgeon: Kate Hargis Nearing, MD;  Location: [facility];  Service: Gynecology;   Laterality: N/A;   DILATATION & CURETTAGE/HYSTEROSCOPY WITH MYOSURE N/A 06/07/2022   Procedure: DILATATION & CURETTAGE/HYSTEROSCOPY WITH MYOSURE;  Surgeon: Nearing Kate Hargis, MD;  Location: [facility];  Service: Gynecology;  Laterality: N/A;   LAPAROSCOPIC LYSIS OF ADHESIONS  06/07/2022   Procedure: LAPAROSCOPIC LYSIS OF ADHESIONS;  Surgeon: Jertson, Jill Evelyn, MD;  Location: [facility];  Service: Gynecology;;   LAPAROSCOPIC REMOVAL ABDOMINAL MASS  2000   LLQ in the setting of Hodgkins   LAPAROSCOPIC SALPINGO OOPHERECTOMY Bilateral 06/07/2022   Procedure: DIAGNOSTIC LAPAROSCOPY/ PELVIC WASHING;  Surgeon: Jertson, Jill Evelyn, MD;  Location: [facility];  Service: Gynecology;  Laterality: Bilateral;   ROBOTIC ASSISTED TOTAL HYSTERECTOMY WITH BILATERAL SALPINGO OOPHERECTOMY Bilateral 07/14/2022   Procedure: XI ROBOTIC ASSISTED TOTAL HYSTERECTOMY WITH BILATERAL SALPINGO OOPHORECTOMY,  MINI LAPAROTOMY,CYSTOSCOPY;  Surgeon: Viktoria Comer SAUNDERS, MD;  Location: [facility];  Service: Gynecology;  Laterality: Bilateral;   SENTINEL NODE BIOPSY N/A 07/14/2022   Procedure:  SENTINEL NODE BIOPSY;  Surgeon: Viktoria Comer SAUNDERS, MD;  Location: [facility];  Service: Gynecology;  Laterality: N/A;   Allergies  Allergen Reactions   Hydrocodone Hives   Oxycodone Hives   Aspirin      Upset stomach   Rosuvastatin      Muscle aches    Family History  Problem Relation Age of Onset   Hypertension Mother    Stroke Mother 1   Hypertension Father    Diabetes Father    Hypertension Sister    Liver disease Sister        related to infected appendix   Diabetes Sister    Breast cancer Sister    Hypertension Sister    Diabetes Sister    Hypertension Brother    Diabetes Brother    Hypertension Daughter    Colon cancer Neg Hx    Ovarian cancer Neg Hx    Endometrial cancer Neg Hx    Pancreatic cancer Neg Hx    Prostate cancer Neg Hx     Social History    Socioeconomic History   Marital status: Divorced    Spouse name: Not on file   Number of children: Not on file   Years of education: Not on file   Highest education level: Not on file  Occupational History   Occupation: works at group home; 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  Tobacco Use   Smoking status: Never   Smokeless tobacco: Never  Vaping Use   Vaping status: Never Used  Substance and Sexual Activity   Alcohol use: No    Alcohol/week: 0.0 standard drinks of alcohol   Drug use: No   Sexual activity: Not Currently    Partners: Male    Birth control/protection: Post-menopausal  Other Topics Concern   Not on file  Social History Narrative   Work or School: retail; 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      Home Situation: lives with her friend       Spiritual Beliefs: no      Lifestyle: no regular exercise; diet is poor            Social Drivers of Corporate investment banker Strain: Not on file  Food Insecurity: No Food Insecurity (03/17/2023)   Hunger Vital Sign    Worried About Running Out of Food in the Last Year: Never true    Ran Out of Food in the Last Year: Never true  Transportation Needs: No Transportation Needs (03/17/2023)   PRAPARE - Administrator, Civil Service (Medical): No    Lack of Transportation (Non-Medical): No  Physical Activity: Not on file  Stress: Not on file  Social Connections: Not on file   Today's Vitals   06/07/24 1308 06/07/24 1348  BP: (!) 120/94 (!) 120/90  Pulse: 78   Resp: 16   Temp: 98.4 F (36.9 C)   TempSrc: Oral   SpO2: 97%   Weight: 197 lb 6.4 oz (89.5 kg)   Height: 5' 3 (1.6 m)    Body mass index is 34.97 kg/m.  Wt Readings from Last 3 Encounters:  06/07/24 197 lb 6.4 oz (89.5 kg)  03/17/23 212 lb 0.6 oz (96.2 kg)  02/28/23 213 lb (96.6 kg)   Physical Exam Vitals and nursing note reviewed.  Constitutional:      General: Whitney Santiago is not in acute distress.    Appearance: Whitney Santiago is  well-developed.  HENT:     Head: Normocephalic and atraumatic.  Right Ear: Hearing, tympanic membrane, ear canal and external ear normal.     Left Ear: Hearing, tympanic membrane, ear canal and external ear normal.     Mouth/Throat:     Mouth: Mucous membranes are moist.     Pharynx: Oropharynx is clear. Uvula midline.  Eyes:     Extraocular Movements: Extraocular movements intact.     Conjunctiva/sclera: Conjunctivae normal.     Pupils: Pupils are equal, round, and reactive to light.  Neck:     Thyroid : No thyroid  mass or thyromegaly.  Cardiovascular:     Rate and Rhythm: Normal rate and regular rhythm.     Pulses:          Dorsalis pedis pulses are 2+ on the right side and 2+ on the left side.     Heart sounds: No murmur heard. Pulmonary:     Effort: Pulmonary effort is normal. No respiratory distress.     Breath sounds: Normal breath sounds.  Abdominal:     Palpations: Abdomen is soft. There is no hepatomegaly or mass.     Tenderness: There is no abdominal tenderness.  Genitourinary:    Comments: No concerns today. Musculoskeletal:     Comments: No signs of synovitis appreciated.  Lymphadenopathy:     Cervical: No cervical adenopathy.     Upper Body:     Right upper body: No supraclavicular adenopathy.     Left upper body: No supraclavicular adenopathy.  Skin:    General: Skin is warm.     Findings: No erythema or rash.  Neurological:     General: No focal deficit present.     Mental Status: Whitney Santiago is alert and oriented to person, place, and time.     Cranial Nerves: No cranial nerve deficit.     Coordination: Coordination normal.     Gait: Gait normal.     Deep Tendon Reflexes:     Reflex Scores:      Bicep reflexes are 2+ on the right side and 2+ on the left side.      Patellar reflexes are 2+ on the right side and 2+ on the left side. Psychiatric:        Mood and Affect: Mood and affect normal.   ASSESSMENT AND PLAN: Whitney Santiago was here today annual  physical examination.  Orders Placed This Encounter  Procedures   Flu vaccine trivalent PF, 6mos and older(Flulaval,Afluria,Fluarix,Fluzone)   Lipid panel   Comprehensive metabolic panel with GFR   Hemoglobin A1c   Microalbumin/Creatinine Ratio, Urine   Lab Results  Component Value Date   HGBA1C 6.7 (H) 06/07/2024   Lab Results  Component Value Date   CHOL 193 06/07/2024   HDL 56.30 06/07/2024   LDLCALC 124 (H) 06/07/2024   TRIG 64.0 06/07/2024   CHOLHDL 3 06/07/2024   Lab Results  Component Value Date   NA 137 06/07/2024   CL 99 06/07/2024   K 3.4 (L) 06/07/2024   CO2 31 06/07/2024   BUN 11 06/07/2024   CREATININE 0.67 06/07/2024   GFR 94.36 06/07/2024   CALCIUM  9.7 06/07/2024   ALBUMIN 4.3 06/07/2024   GLUCOSE 103 (H) 06/07/2024   Lab Results  Component Value Date   ALT 12 06/07/2024   AST 17 06/07/2024   ALKPHOS 80 06/07/2024   BILITOT 0.5 06/07/2024   Lab Results  Component Value Date   MICROALBUR <0.7 06/07/2024   Routine general medical examination at a health care facility Assessment & Plan: We discussed  the importance of regular physical activity and healthy diet for prevention of chronic illness and/or complications. Preventive guidelines reviewed. Vaccination updated. Status post hysterectomy. Next CPE in a year.   Type 2 diabetes mellitus with other specified complication, without long-term current use of insulin (HCC) Assessment & Plan: Problem has been adequately controlled, last hemoglobin A1c in 02/2023 was 6.7. Whitney Santiago is not monitoring BS. Continue metformin  500 mg daily, higher doses caused nausea and vomiting. Further recommendation will be given according to hemoglobin A1c result. Annual eye exam, periodic dental and foot care recommended. F/U in 5-6 months.  Orders: -     Comprehensive metabolic panel with GFR; Future -     Hemoglobin A1c; Future -     Microalbumin / creatinine urine ratio; Future -     metFORMIN  HCl; Take 1 tablet  (500 mg total) by mouth daily with breakfast.  Dispense: 90 tablet; Refill: 2  Essential hypertension Assessment & Plan: DBP mildly elevated. Whitney Santiago is not monitoring BP at home, recommend doing so. Continue HCTZ 25 mg daily and lisinopril  10 mg daily as well as low-salt diet. Eye exam is overdue. Follow-up in 6 months.  Orders: -     hydroCHLOROthiazide ; Take 1 tablet (25 mg total) by mouth daily.  Dispense: 90 tablet; Refill: 2 -     Lisinopril ; Take 1 tablet (10 mg total) by mouth daily.  Dispense: 90 tablet; Refill: 2  Hyperlipidemia, unspecified hyperlipidemia type Assessment & Plan: Currently on pravastatin  20 mg daily. Further recommendation will be given according to lipid panel result.  Orders: -     Lipid panel; Future  Need for influenza vaccination -     Flu vaccine trivalent PF, 6mos and older(Flulaval,Afluria,Fluarix,Fluzone)  Return in 6 months (on 12/05/2024) for chronic problems.  Clancey Welton G. [place], MD  [facility]. [place] office.

## 2024-06-07 NOTE — Assessment & Plan Note (Signed)
 DBP mildly elevated. She is not monitoring BP at home, recommend doing so. Continue HCTZ 25 mg daily and lisinopril  10 mg daily as well as low-salt diet. Eye exam is overdue. Follow-up in 6 months.

## 2024-06-10 MED ORDER — ROSUVASTATIN CALCIUM 20 MG PO TABS: 20.0000 mg | ORAL_TABLET | Freq: Every day | ORAL | 0 refills | Status: AC

## 2024-06-10 NOTE — Addendum Note (Signed)
 Addended by: METTA KRISTEN CROME on: 06/10/2024 01:43 PM   Modules accepted: Orders

## 2024-06-11 NOTE — Addendum Note (Signed)
 Addended by: Efrat Zuidema on: 06/11/2024 08:51 AM   Modules accepted: Orders

## 2024-08-09 ENCOUNTER — Encounter: Payer: Self-pay | Admitting: Obstetrics and Gynecology

## 2024-08-09 ENCOUNTER — Ambulatory Visit: Admitting: Obstetrics and Gynecology

## 2024-08-09 VITALS — BP 137/61 | HR 82 | Ht 63.0 in | Wt 189.0 lb

## 2024-08-09 DIAGNOSIS — N958 Other specified menopausal and perimenopausal disorders: Secondary | ICD-10-CM | POA: Diagnosis not present

## 2024-08-09 MED ORDER — ESTRADIOL 0.01 % VA CREA: TOPICAL_CREAM | VAGINAL | 12 refills | Status: AC

## 2024-08-09 NOTE — Progress Notes (Signed)
 NEW GYNECOLOGY VISIT No chief complaint on file.    Subjective:  Whitney Santiago is a 63 y.o. [id number] who presents for vaginal dryness.  Reports history of hysterectomy in 2023 with bilateral salpingo-oophorectomy. Since then, she has been experiencing vaginal dryness. Reports vaginal irritation and pain with intercourse.  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 Hx Hodgkin's lymphoma s/p treatment  OB History     Gravida  2   Para  2   Term  2   Preterm      AB      Living  2      SAB      IAB      Ectopic      Multiple      Live Births  2           Past Medical History:  Diagnosis Date   Anxiety    no med   Arthritis    in both knees has received injections in the past. 05/24/2022   Fibroid    History of migraine    Hx - last migraine 2 yrs ago, no longer a problem   HLD (hyperlipidemia)    Hodgkin disease (HCC) 1999   lymphocytic predominant Hodkin's 2000, s/p 4 weeks Rituxan, chlorambucil 10/21/99-04/19/00   Hx of iron deficiency anemia    Hx -per [facility] notes, since childhood, possible thalassemia   Hypertension    LAD (lymphadenopathy), axillary    eval with onc and [facility] 2016   Obesity    Pre-diabetes    HgA1c 6.4 on 01/24/2022    Past Surgical History:  Procedure Laterality Date   AXILLARY LYMPH NODE BIOPSY Right 06/09/2016   Procedure: AXILLARY LYMPH NODE BIOPSY;  Surgeon: Krystal Russell, MD;  Location: [facility];  Service: General;  Laterality: Right;   BREAST EXCISIONAL BIOPSY Left 2006   BREAST SURGERY Left 2007   due to bloody discharge   COLONOSCOPY     DILATATION & CURETTAGE/HYSTEROSCOPY WITH MYOSURE N/A 05/24/2016   Procedure: DILATATION & CURETTAGE/HYSTEROSCOPY WITH MYOSURE;  Surgeon: Kate Hargis Nearing, MD;  Location: [facility];  Service: Gynecology;  Laterality: N/A;   DILATATION & CURETTAGE/HYSTEROSCOPY  WITH MYOSURE N/A 06/07/2022   Procedure: DILATATION & CURETTAGE/HYSTEROSCOPY WITH MYOSURE;  Surgeon: Nearing Kate Hargis, MD;  Location: [facility];  Service: Gynecology;  Laterality: N/A;   LAPAROSCOPIC LYSIS OF ADHESIONS  06/07/2022   Procedure: LAPAROSCOPIC LYSIS OF ADHESIONS;  Surgeon: Jertson, Jill Evelyn, MD;  Location: [facility];  Service: Gynecology;;   LAPAROSCOPIC REMOVAL ABDOMINAL MASS  2000   LLQ in the setting of Hodgkins   LAPAROSCOPIC SALPINGO OOPHERECTOMY Bilateral 06/07/2022   Procedure: DIAGNOSTIC LAPAROSCOPY/ PELVIC WASHING;  Surgeon: Jertson, Jill Evelyn, MD;  Location: [facility];  Service: Gynecology;  Laterality: Bilateral;   ROBOTIC ASSISTED TOTAL HYSTERECTOMY WITH BILATERAL SALPINGO OOPHERECTOMY Bilateral 07/14/2022   Procedure: XI ROBOTIC ASSISTED TOTAL HYSTERECTOMY WITH BILATERAL SALPINGO OOPHORECTOMY,  MINI LAPAROTOMY,CYSTOSCOPY;  Surgeon: Viktoria Comer SAUNDERS, MD;  Location: [facility];  Service: Gynecology;  Laterality: Bilateral;   SENTINEL NODE BIOPSY N/A 07/14/2022   Procedure: SENTINEL NODE BIOPSY;  Surgeon: Viktoria Comer SAUNDERS, MD;  Location: [facility];  Service: Gynecology;  Laterality: N/A;    Social History   Socioeconomic History   Marital status: Divorced    Spouse name: Not on file   Number of children: Not  on file   Years of education: Not on file   Highest education level: Not on file  Occupational History   Occupation: works at group home; 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  Tobacco Use   Smoking status: Never   Smokeless tobacco: Never  Vaping Use   Vaping status: Never Used  Substance and Sexual Activity   Alcohol use: No    Alcohol/week: 0.0 standard drinks of alcohol   Drug use: No   Sexual activity: Not Currently    Partners: Male    Birth control/protection: Post-menopausal  Other Topics Concern   Not on file  Social History Narrative   Work or School: retail; 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      Home Situation: lives with her friend       Spiritual Beliefs: no      Lifestyle: no regular exercise; diet is poor            Social Drivers of Corporate Investment Banker Strain: Not on file  Food Insecurity: No Food Insecurity (03/17/2023)   Hunger Vital Sign    Worried About Running Out of Food in the Last Year: Never true    Ran Out of Food in the Last Year: Never true  Transportation Needs: No Transportation Needs (03/17/2023)   PRAPARE - Administrator, Civil Service (Medical): No    Lack of Transportation (Non-Medical): No  Physical Activity: Not on file  Stress: Not on file  Social Connections: Not on file    Family History  Problem Relation Age of Onset   Hypertension Mother    Stroke Mother 67   Hypertension Father    Diabetes Father    Hypertension Sister    Liver disease Sister        related to infected appendix   Diabetes Sister    Breast cancer Sister    Hypertension Sister    Diabetes Sister    Hypertension Brother    Diabetes Brother    Hypertension Daughter    Colon cancer Neg Hx    Ovarian cancer Neg Hx    Endometrial cancer Neg Hx    Pancreatic cancer Neg Hx    Prostate cancer Neg Hx     Current Outpatient Medications on File Prior to Visit  Medication Sig Dispense Refill   hydrochlorothiazide  (HYDRODIURIL ) 25 MG tablet Take 1 tablet (25 mg total) by mouth daily. 90 tablet 2   lisinopril  (ZESTRIL ) 10 MG tablet Take 1 tablet (10 mg total) by mouth daily. 90 tablet 2   metFORMIN  (GLUCOPHAGE ) 500 MG tablet Take 1 tablet (500 mg total) by mouth daily with breakfast. 90 tablet 2   rosuvastatin  (CRESTOR ) 20 MG tablet Take 1 tablet (20 mg total) by mouth daily. 90 tablet 0   Calcium  Carb-Cholecalciferol (CALCIUM  + D3 PO) Take 1 tablet by mouth daily.  (Patient not taking: Reported on 06/07/2024)     No current facility-administered medications on file prior to visit.    Allergies  Allergen Reactions   Hydrocodone  Hives   Oxycodone Hives   Aspirin      Upset stomach   Rosuvastatin      Muscle aches     Objective:   Vitals:   08/09/24 0914  BP: 137/61  Pulse: 82  Weight: 189 lb (85.7 kg)  Height: 5' 3 (1.6 m)   Physical Examination:   General appearance - well appearing, and in no distress  Mental status - alert, oriented to person,  place, and time  Psych:  normal mood and affect    Assessment and Plan:  1. Genitourinary syndrome of menopause (Primary) Patient with bothersome symptoms of vaginal atrophy. Reviewed options for management including lubricants, vaginal moisturizers and vaginal estrogen. She is interested in vaginal estrogen. No hx VTE or breast cancer. Reviewed vaginal estrogen in detail, rx given. F/u in 3 months. Encouraged lubrication during sexual activity. - estradiol  (ESTRACE ) 0.01 % CREA vaginal cream; Apply 1 gram per vagina every night for 2 weeks, then apply three times a week  Dispense: 30 g; Refill: 12   Return in about 3 months (around 11/09/2024).  No future appointments.  Rollo ONEIDA Bring, MD, FACOG Obstetrician & Gynecologist, [facility] for [facility], [facility]
